# Patient Record
Sex: Female | Born: 1965 | Race: White | Hispanic: No | Marital: Married | Smoking: Never smoker
Health system: Southern US, Community
[De-identification: ages and names within clinical notes are randomized; demographics above are authoritative.]

## PROBLEM LIST (undated history)

## (undated) DIAGNOSIS — F419 Anxiety disorder, unspecified: Secondary | ICD-10-CM

## (undated) DIAGNOSIS — F32A Depression, unspecified: Secondary | ICD-10-CM

## (undated) DIAGNOSIS — I10 Essential (primary) hypertension: Secondary | ICD-10-CM

## (undated) DIAGNOSIS — F329 Major depressive disorder, single episode, unspecified: Secondary | ICD-10-CM

## (undated) HISTORY — DX: Anxiety disorder, unspecified: F41.9

## (undated) HISTORY — DX: Essential (primary) hypertension: I10

---

## 2014-04-05 LAB — HM PAP SMEAR: HM Pap smear: NEGATIVE

## 2014-08-02 ENCOUNTER — Encounter: Payer: Self-pay | Admitting: Family Medicine

## 2014-08-02 ENCOUNTER — Ambulatory Visit (INDEPENDENT_AMBULATORY_CARE_PROVIDER_SITE_OTHER): Payer: 59 | Admitting: Family Medicine

## 2014-08-02 VITALS — BP 133/84 | HR 90 | Ht 63.5 in | Wt 152.0 lb

## 2014-08-02 DIAGNOSIS — F341 Dysthymic disorder: Secondary | ICD-10-CM

## 2014-08-02 DIAGNOSIS — I10 Essential (primary) hypertension: Secondary | ICD-10-CM | POA: Insufficient documentation

## 2014-08-02 DIAGNOSIS — F419 Anxiety disorder, unspecified: Secondary | ICD-10-CM

## 2014-08-02 DIAGNOSIS — R5381 Other malaise: Secondary | ICD-10-CM

## 2014-08-02 DIAGNOSIS — F32A Depression, unspecified: Secondary | ICD-10-CM

## 2014-08-02 DIAGNOSIS — F329 Major depressive disorder, single episode, unspecified: Secondary | ICD-10-CM

## 2014-08-02 DIAGNOSIS — R5383 Other fatigue: Secondary | ICD-10-CM

## 2014-08-02 DIAGNOSIS — G5702 Lesion of sciatic nerve, left lower limb: Secondary | ICD-10-CM

## 2014-08-02 DIAGNOSIS — G5700 Lesion of sciatic nerve, unspecified lower limb: Secondary | ICD-10-CM

## 2014-08-02 DIAGNOSIS — L659 Nonscarring hair loss, unspecified: Secondary | ICD-10-CM

## 2014-08-02 DIAGNOSIS — Z23 Encounter for immunization: Secondary | ICD-10-CM

## 2014-08-02 MED ORDER — ALPRAZOLAM 0.5 MG PO TABS: 0.5000 mg | ORAL_TABLET | Freq: Every day | ORAL | Status: DC | PRN

## 2014-08-02 NOTE — Progress Notes (Signed)
CC: Rachael [name] is a 48 y.o. female is here for Establish Care   Subjective: HPI:  Pleasant 48 year old here to establish care  Patient reports a history of anxiety and depression stemming back into her 45s. She has been on a variety of antianxiety and antidepression medication. she estimates that for the past 16 years She has been on Cymbalta and as needed Xanax except for during while she's pregnant in the first few months after her pregnancies. Cymbalta has helped with subjective depression not further defined and Xanax has been used no more than 3 times a week for the past few years for panic attacks that only occur during stressful situations at home or around the community. Quality of life is satisfactory on her current regimen per her report. She denies any recent or remote bouts of wanting to harm herself or others nor intolerance to the above medications.  Reports a history of essential hypertension diagnosed while she was on birth control pills. She cannot remember exactly what she was on however it sounds like it contained both estrogen and progesterone and blood pressure became normotensive immediately after stopping the medication  She expresses concerns with fatigue, thinning of her hair, and oily skin that has been noticed on a daily basis for the last month. There's been no change medications or diet nor supplements other than trying biotin which helped the speed of her hair grow but helped with nothing else. She's had her thyroid checked 2 years ago but not recently. She denies any recent surgery, trauma nor particularly stressful events.  She complains of left hip pain that radiates from the left buttock around the lateral hip and down the anterior thigh. It is described as a mild to moderate take after periods of inactivity slightly improved the more she walks or runs and never extends beyond the knee. She's been dealing with it for a year almost on a daily basis now no  interventions as of yet. She's had in the past but never for this long period. no benefit from ibuprofen and there's been no other interventions. Denies recent or remote injury  Review of Systems - General ROS: negative for - chills, fever, night sweats, weight gain or weight loss Ophthalmic ROS: negative for - decreased vision Psychological ROS: negative for - uncontrolled anxiety or depression ENT ROS: negative for - hearing change, nasal congestion, tinnitus or allergies Hematological and Lymphatic ROS: negative for - bleeding problems, bruising or swollen lymph nodes Breast ROS: negative Respiratory ROS: no cough, shortness of breath, or wheezing Cardiovascular ROS: no chest pain or dyspnea on exertion Gastrointestinal ROS: no abdominal pain, change in bowel habits, or black or bloody stools Genito-Urinary ROS: negative for - genital discharge, genital ulcers, incontinence or abnormal bleeding from genitals Musculoskeletal ROS: negative for - joint pain or muscle pain other than that described above Neurological ROS: negative for - headaches or memory loss Dermatological ROS: negative for lumps, mole changes, rash and skin lesion changes  Past Medical History  Diagnosis Date  . Hypertension     Past Surgical History  Procedure Laterality Date  . Cesarean section  01/04/1999   Family History  Problem Relation Age of Onset  . Breast cancer      aunts  . Diabetes      grandmother   . Stroke      grandmother   . Hypertension Father     History   Social History  . Marital Status: Married    Spouse Name:  N/A    Number of Children: N/A  . Years of Education: N/A   Occupational History  . Not on file.   Social History Main Topics  . Smoking status: Never Smoker   . Smokeless tobacco: Not on file  . Alcohol Use: No  . Drug Use: No  . Sexual Activity: Yes    Partners: Male   Other Topics Concern  . Not on file   Social History Narrative  . No narrative on file      Objective: BP 133/84  Pulse 90  Ht 5' 3.5" (1.613 m)  Wt 152 lb (68.947 kg)  BMI 26.50 kg/m2  General: Alert and Oriented, No Acute Distress HEENT: Pupils equal, round, reactive to light. Conjunctivae clear.   moist mucous membranes pharynx unremarkable  Lungs:  clear comfortable work of breathing  Cardiac: Regular rate and rhythm.  Extremities: No peripheral edema.  Strong peripheral pulses. L4 and S1 DTRs two over four bilaterally and symmetric. Full range of motion and strength of both lower extremities. Straight leg raise is negative on the left, no pain with internal or external rotation of the left femur nor is pain reproduced with palpation of the left greater trochanter.  Mental Status: No depression, anxiety, nor agitation. Skin: Warm and dry.  Assessment & Plan: [name] was seen today for establish care.  Diagnoses and associated orders for this visit:  Anxiety and depression - ALPRAZolam (XANAX) 0.5 MG tablet; Take 1 tablet (0.5 mg total) by mouth daily as needed for anxiety.  Thinning hair  Essential hypertension, benign  Other fatigue - Vit D  25 hydroxy (rtn osteoporosis monitoring) - TSH  Piriformis syndrome of left side  Need for prophylactic vaccination and inoculation against influenza    Anxiety and depression: Control, Continue sparing use of Xanax and daily Cymbalta Thinning hair and fatigue: Discussed this could be a sign of premenopausal but will rule out thyroid abnormality or vitamin D deficiency first Piriformis syndrome: She was given a handout at home rehabilitation exercises to do on a daily basis the next 3-4 weeks to help with her left hip pain Essential hypertension: Controlled on diet and exercise interventions  Return if symptoms worsen or fail to improve.

## 2014-08-03 ENCOUNTER — Telehealth: Payer: Self-pay | Admitting: Family Medicine

## 2014-08-03 DIAGNOSIS — E559 Vitamin D deficiency, unspecified: Secondary | ICD-10-CM | POA: Insufficient documentation

## 2014-08-03 LAB — VITAMIN D 25 HYDROXY (VIT D DEFICIENCY, FRACTURES): VIT D 25 HYDROXY: 36 ng/mL (ref 30–89)

## 2014-08-03 LAB — TSH: TSH: 1.436 u[IU]/mL (ref 0.350–4.500)

## 2014-08-03 MED ORDER — VITAMIN D (ERGOCALCIFEROL) 1.25 MG (50000 UNIT) PO CAPS: 50000.0000 [IU] | ORAL_CAPSULE | ORAL | Status: DC

## 2014-08-03 NOTE — Telephone Encounter (Signed)
Sue Lush, Will you please let patient know that her thyroid function was normal however vitamin D was at the lower limit of normal and given her fatigue I'd recommend starting a weekly supplement.  We'll want to recheck this in late December or early January.  [facility]

## 2014-08-03 NOTE — Telephone Encounter (Signed)
Pt.notified

## 2014-08-18 ENCOUNTER — Encounter: Payer: Self-pay | Admitting: Family Medicine

## 2014-10-10 ENCOUNTER — Encounter: Payer: Self-pay | Admitting: Emergency Medicine

## 2014-10-10 ENCOUNTER — Emergency Department
Admission: EM | Admit: 2014-10-10 | Discharge: 2014-10-10 | Disposition: A | Discharge status: Home / Self Care | Payer: 59 | Source: Home / Self Care | Attending: Family Medicine | Admitting: Family Medicine

## 2014-10-10 DIAGNOSIS — J209 Acute bronchitis, unspecified: Secondary | ICD-10-CM

## 2014-10-10 HISTORY — DX: Depression, unspecified: F32.A

## 2014-10-10 HISTORY — DX: Major depressive disorder, single episode, unspecified: F32.9

## 2014-10-10 MED ORDER — BENZONATATE 200 MG PO CAPS: 200.0000 mg | ORAL_CAPSULE | Freq: Every day | ORAL | Status: DC

## 2014-10-10 MED ORDER — AZITHROMYCIN 250 MG PO TABS: ORAL_TABLET | ORAL | Status: DC

## 2014-10-10 NOTE — Discharge Instructions (Signed)
Take plain Mucinex (1200 mg guaifenesin) twice daily for cough and congestion.  May add Sudafed if sinus congestion increases.   Increase fluid intake, rest. May use Afrin nasal spray (or generic oxymetazoline) twice daily for about 5 days.  Also recommend using saline nasal spray several times daily and saline nasal irrigation (AYR is a common brand)  Stop all antihistamines for now, and other non-prescription cough/cold preparations. May take Ibuprofen 200mg , 4 tabs every 8 hours with food for neck soreness.   Follow-up with family doctor if not improving 7 to 10 days.

## 2014-10-10 NOTE — ED Provider Notes (Signed)
CSN: [id number]     Arrival date & time 10/10/14  1102 History   First MD Initiated Contact with Patient 10/10/14 1201     Chief Complaint  Patient presents with  . Generalized Body Aches  . Headache  . Cough  . Hoarse      HPI Comments: Two weeks ago patient developed scratchy throat, and several days later developed a cough.  No sinus congestion.  Her cough has been worse for a week.  She has now developed myalgias and sweats, and the cough awakens her at night.  The history is provided by the patient.    Past Medical History  Diagnosis Date  . Hypertension   . Depression    Past Surgical History  Procedure Laterality Date  . Cesarean section  01/04/1999   Family History  Problem Relation Age of Onset  . Breast cancer      aunts  . Diabetes      grandmother   . Stroke      grandmother   . Hypertension Father    History  Substance Use Topics  . Smoking status: Never Smoker   . Smokeless tobacco: Not on file  . Alcohol Use: No   OB History    No data available     Review of Systems + sore throat + hoarse + cough No pleuritic pain No wheezing + nasal congestion + post-nasal drainage No sinus pain/pressure No itchy/red eyes No earache No hemoptysis No SOB No fever, + chills/sweats No nausea No vomiting No abdominal pain No diarrhea No urinary symptoms No skin rash + fatigue + myalgias + headache Used OTC meds without relief  Allergies  Abilify  Home Medications   Prior to Admission medications   Medication Sig Start Date End Date Taking? Authorizing Provider  ALPRAZolam Prudy Feeler(XANAX) 0.5 MG tablet Take 1 tablet (0.5 mg total) by mouth daily as needed for anxiety. 08/02/14   Sean Hommel, DO  azithromycin (ZITHROMAX Z-PAK) 250 MG tablet Take 2 tabs today; then begin one tab once daily for 4 more days. 10/10/14   Lattie HawStephen A Jariyah Hackley, MD  benzonatate (TESSALON) 200 MG capsule Take 1 capsule (200 mg total) by mouth at bedtime. Take as needed for cough  10/10/14   Lattie HawStephen A Filipe Greathouse, MD  DULoxetine (CYMBALTA) 60 MG capsule Take 60 mg by mouth 2 (two) times daily.    Historical Provider, MD  Vitamin D, Ergocalciferol, (DRISDOL) 50000 UNITS CAPS capsule Take 1 capsule (50,000 Units total) by mouth every 7 (seven) days. Recheck Vitamin D in 3 Months 08/03/14   Laren BoomSean Hommel, DO   BP 118/78 mmHg  Pulse 92  Temp(Src) 98 F (36.7 C) (Oral)  Resp 16  Ht 5' 3.5" (1.613 m)  Wt 160 lb (72.576 kg)  BMI 27.89 kg/m2  SpO2 99%  LMP 09/27/2014 Physical Exam Nursing notes and Vital Signs reviewed. Appearance:  Patient appears healthy, stated age, and in no acute distress Eyes:  Pupils are equal, round, and reactive to light and accomodation.  Extraocular movement is intact.  Conjunctivae are not inflamed  Ears:  Canals normal.  Tympanic membranes normal.  Nose:  Mildly congested turbinates.  No sinus tenderness.  Maxillary sinus tenderness is present.  Pharynx:  Normal Neck:  Supple.  Slightly tender shotty anterior/posterior nodes are palpated bilaterally  Lungs:  Clear to auscultation.  Breath sounds are equal.  Heart:  Regular rate and rhythm without murmurs, rubs, or gallops.  Abdomen:  Nontender without masses or hepatosplenomegaly.  Bowel sounds are present.  No CVA or flank tenderness.  Extremities:  No edema.  No calf tenderness Skin:  No rash present.   ED Course  Procedures  none          MDM   1. Acute bronchitis, unspecified organism    Begin Z-pack to cover atypicals.  Prescription written for Benzonatate [facility](Tessalon) to take at bedtime for night-time cough.  Take plain Mucinex (1200 mg guaifenesin) twice daily for cough and congestion.  May add Sudafed if sinus congestion increases.   Increase fluid intake, rest. May use Afrin nasal spray (or generic oxymetazoline) twice daily for about 5 days.  Also recommend using saline nasal spray several times daily and saline nasal irrigation (AYR is a common brand)  Stop all antihistamines  for now, and other non-prescription cough/cold preparations. May take Ibuprofen 200mg , 4 tabs every 8 hours with food for neck soreness.   Follow-up with family doctor if not improving 7 to 10 days.     Lattie HawStephen A Bijou Easler, MD 10/20/14 [phone]

## 2014-10-10 NOTE — ED Notes (Signed)
Reports 10 day history of cough, hoarseness, general aches and headache; now neck ache she attributes to coughing. Took ibuprofen 400mg  at 0730 today.

## 2014-10-13 ENCOUNTER — Telehealth: Payer: Self-pay | Admitting: Emergency Medicine

## 2014-10-13 NOTE — ED Notes (Signed)
Inquired about patient's status; encourage them to call with questions/concerns.  

## 2014-10-25 ENCOUNTER — Other Ambulatory Visit: Payer: Self-pay | Admitting: Family Medicine

## 2014-12-20 ENCOUNTER — Encounter: Payer: Self-pay | Admitting: Family Medicine

## 2014-12-20 ENCOUNTER — Ambulatory Visit (INDEPENDENT_AMBULATORY_CARE_PROVIDER_SITE_OTHER): Payer: 59 | Admitting: Family Medicine

## 2014-12-20 VITALS — BP 121/80 | HR 72 | Wt 158.0 lb

## 2014-12-20 DIAGNOSIS — M7061 Trochanteric bursitis, right hip: Secondary | ICD-10-CM

## 2014-12-20 DIAGNOSIS — F419 Anxiety disorder, unspecified: Secondary | ICD-10-CM

## 2014-12-20 DIAGNOSIS — F418 Other specified anxiety disorders: Secondary | ICD-10-CM

## 2014-12-20 DIAGNOSIS — L659 Nonscarring hair loss, unspecified: Secondary | ICD-10-CM

## 2014-12-20 DIAGNOSIS — F329 Major depressive disorder, single episode, unspecified: Secondary | ICD-10-CM

## 2014-12-20 MED ORDER — DULOXETINE HCL 60 MG PO CPEP: 60.0000 mg | ORAL_CAPSULE | Freq: Two times a day (BID) | ORAL | Status: DC

## 2014-12-20 NOTE — Progress Notes (Signed)
CC: Rachael MayersSheila [name] is a 49 y.o. female is here for f/u medications   Subjective: HPI:  Follow-up thinning hair: Since I saw her last she has not had any worsening of her subjective hair thinning. Occasionally sees small amounts of hair or brushing. Denies focal hair loss on the scalp. She  States is more diffuse and overall mild in severity. No benefit from vitamin supple mentation including prenatal vitamins and biotin. No hair loss elsewhere nor any skin complaints.  Follow-up anxiety impression: She states the winter is always the worst time of year for both anxiety and depression. She's been taking one Xanax a week since I saw her last for anxiety. She'll take this when she feels overwhelmed by responsibilities as a mother. Symptoms are worsened with car pooling, coordinating arrival departure for her 3 children at 3 separate schools. Symptoms are improved on the weekends. She reports depression is currently mild and tolerable not interfering with her quality of life. She is taking Cymbalta 60 mg twice a day. No thoughts when on herself or others. Both of the above condition seemed to improve with exercise.  She's been trying to run more regularly now however has been having pain in the right hip. This localized on the lateral surface of the hip. It's worse for the first few seconds after periods of inactivity and then disappears after constant activity has been started. Prolonged activity will cause the pain to be worse when it returns. No interventions as of yet. Denies radiation of the pain. It is described as sharp.   Review Of Systems Outlined In HPI  Past Medical History  Diagnosis Date  . Hypertension   . Depression     Past Surgical History  Procedure Laterality Date  . Cesarean section  01/04/1999   Family History  Problem Relation Age of Onset  . Breast cancer      aunts  . Diabetes      grandmother   . Stroke      grandmother   . Hypertension Father     History    Social History  . Marital Status: Married    Spouse Name: N/A    Number of Children: N/A  . Years of Education: N/A   Occupational History  . Not on file.   Social History Main Topics  . Smoking status: Never Smoker   . Smokeless tobacco: Not on file  . Alcohol Use: No  . Drug Use: No  . Sexual Activity:    Partners: Male   Other Topics Concern  . Not on file   Social History Narrative     Objective: BP 121/80 mmHg  Pulse 72  Wt 158 lb (71.668 kg)  Vital signs reviewed. General: Alert and Oriented, No Acute Distress HEENT: Pupils equal, round, reactive to light. Conjunctivae clear.  External ears unremarkable.  Moist mucous membranes. Lungs: Clear and comfortable work of breathing, speaking in full sentences without accessory muscle use. Cardiac: Regular rate and rhythm.  Neuro: CN II-XII grossly intact, gait normal. Extremities: No peripheral edema.  Strong peripheral pulses. Pain is reproduced with palpation of the right greater trochanter Mental Status: No depression, anxiety, nor agitation. Logical though process. Skin: Warm and dry. Scalp has no gross abnormality and no appreciable hair loss.  Assessment & Plan: Rachael HatchetSheila was seen today for f/u medications.  Diagnoses and associated orders for this visit:  Thinning hair  Anxiety and depression - DULoxetine (CYMBALTA) 60 MG capsule; Take 1 capsule (60 mg total) by  mouth 2 (two) times daily.  Trochanteric bursitis of right hip    Thinning hair: Reassurance provided that I don't appreciate a significant amount of hair loss, if she still has subjective symptoms discussed Rogaine, she'll think about it. Anxiety and depression: Controlled continue Cymbalta and sparing use of Xanax, will try to focus on physical activity helping with any lingering depression Bursitis of the right hip: She was given a home exercise rehabilitative exercise plan to be done on a daily basis and if not better in 2 weeks come back to  see me.  Return in about 6 months (around 06/20/2015), or if symptoms worsen or fail to improve.

## 2015-01-17 ENCOUNTER — Other Ambulatory Visit: Payer: Self-pay | Admitting: Family Medicine

## 2015-01-20 ENCOUNTER — Other Ambulatory Visit: Payer: Self-pay | Admitting: Family Medicine

## 2015-02-22 ENCOUNTER — Other Ambulatory Visit: Payer: Self-pay | Admitting: Family Medicine

## 2015-04-04 ENCOUNTER — Ambulatory Visit (INDEPENDENT_AMBULATORY_CARE_PROVIDER_SITE_OTHER): Payer: 59 | Admitting: Family Medicine

## 2015-04-04 ENCOUNTER — Encounter: Payer: Self-pay | Admitting: Family Medicine

## 2015-04-04 VITALS — BP 127/72 | HR 110 | Temp 99.2°F | Wt 147.0 lb

## 2015-04-04 DIAGNOSIS — J029 Acute pharyngitis, unspecified: Secondary | ICD-10-CM | POA: Diagnosis not present

## 2015-04-04 LAB — POCT RAPID STREP A (OFFICE): Rapid Strep A Screen: POSITIVE — AB

## 2015-04-04 MED ORDER — PENICILLIN G BENZATHINE 1200000 UNIT/2ML IM SUSP
1.2000 10*6.[IU] | Freq: Once | INTRAMUSCULAR | Status: AC
Start: 2015-04-04 — End: 2015-04-04
  Administered 2015-04-04: 1.2 10*6.[IU] via INTRAMUSCULAR

## 2015-04-04 NOTE — Progress Notes (Signed)
CC: [name] is a 49 y.o. female is here for Sore Throat   Subjective: HPI:  Sore throat that began 2 days ago worse with swallowing deep in the back of the throat. Interventions have included ibuprofen which seems to be beneficial for about 6 hours. Accompanied by subjective fevers and chills with myalgias. No interventions other than above. Symptoms have not gotten better worse since onset and they came on abruptly Sunday night. No known sick contacts. Denies cough, nasal congestion, facial pain, weakness. Review of systems positive for rash on the torso and headache.   Review Of Systems Outlined In HPI  Past Medical History  Diagnosis Date  . Hypertension   . Depression     Past Surgical History  Procedure Laterality Date  . Cesarean section  01/04/1999   Family History  Problem Relation Age of Onset  . Breast cancer      aunts  . Diabetes      grandmother   . Stroke      grandmother   . Hypertension Father     History   Social History  . Marital Status: Married    Spouse Name: N/A  . Number of Children: N/A  . Years of Education: N/A   Occupational History  . Not on file.   Social History Main Topics  . Smoking status: Never Smoker   . Smokeless tobacco: Not on file  . Alcohol Use: No  . Drug Use: No  . Sexual Activity:    Partners: Male   Other Topics Concern  . Not on file   Social History Narrative     Objective: BP 127/72 mmHg  Pulse 110  Temp(Src) 99.2 F (37.3 C) (Oral)  Wt 147 lb (66.679 kg)  General: Alert and Oriented, No Acute Distress HEENT: Pupils equal, round, reactive to light. Conjunctivae clear.  External ears unremarkable, canals clear with intact TMs with appropriate landmarks.  Middle ear appears open without effusion. Pink inferior turbinates.  Moist mucous membranes, uvula is midline both left and right tonsils have moderate exudates..  Neck supple without palpable lymphadenopathy nor abnormal masses. Lungs: Clear to  auscultation bilaterally, no wheezing/ronchi/rales.  Comfortable work of breathing. Good air movement. Extremities: No peripheral edema.  Strong peripheral pulses.  Mental Status: No depression, anxiety, nor agitation. Skin: Warm and dry. Like erythematous rash on the abdomen and back  Assessment & Plan: Rachael [name] was seen today for sore throat.  Diagnoses and all orders for this visit:  Acute pharyngitis, unspecified pharyngitis type Orders: -     POCT rapid strep A -     penicillin g benzathine (BICILLIN LA) 1200000 UNIT/2ML injection 1.2 Million Units; Inject 2 mLs (1.2 Million Units total) into the muscle once.   offered oral amoxicillin versus intermuscular penicillin she preferred the latter of the 2. Discussed contagiousness and changing toothbrush in 2-3 days. Signs and symptoms requring emergent/urgent reevaluation were discussed with the patient.   Return if symptoms worsen or fail to improve.

## 2015-08-31 ENCOUNTER — Other Ambulatory Visit: Payer: Self-pay | Admitting: Family Medicine

## 2015-09-07 ENCOUNTER — Encounter: Payer: Self-pay | Admitting: Family Medicine

## 2015-09-07 ENCOUNTER — Ambulatory Visit (INDEPENDENT_AMBULATORY_CARE_PROVIDER_SITE_OTHER): Payer: 59 | Admitting: Family Medicine

## 2015-09-07 VITALS — BP 123/81 | HR 78 | Wt 145.0 lb

## 2015-09-07 DIAGNOSIS — F418 Other specified anxiety disorders: Secondary | ICD-10-CM

## 2015-09-07 DIAGNOSIS — Z23 Encounter for immunization: Secondary | ICD-10-CM | POA: Diagnosis not present

## 2015-09-07 DIAGNOSIS — Z9289 Personal history of other medical treatment: Secondary | ICD-10-CM | POA: Diagnosis not present

## 2015-09-07 DIAGNOSIS — Z1211 Encounter for screening for malignant neoplasm of colon: Secondary | ICD-10-CM | POA: Diagnosis not present

## 2015-09-07 DIAGNOSIS — Z0000 Encounter for general adult medical examination without abnormal findings: Secondary | ICD-10-CM

## 2015-09-07 DIAGNOSIS — F419 Anxiety disorder, unspecified: Secondary | ICD-10-CM

## 2015-09-07 DIAGNOSIS — F329 Major depressive disorder, single episode, unspecified: Secondary | ICD-10-CM

## 2015-09-07 DIAGNOSIS — Z01419 Encounter for gynecological examination (general) (routine) without abnormal findings: Secondary | ICD-10-CM

## 2015-09-07 MED ORDER — DULOXETINE HCL 30 MG PO CPEP: ORAL_CAPSULE | ORAL | Status: DC

## 2015-09-07 MED ORDER — VILAZODONE HCL 10 & 20 MG PO KIT: 1.0000 | PACK | Freq: Every day | ORAL | Status: DC

## 2015-09-07 NOTE — Progress Notes (Signed)
CC: Rachael [name] is a 49 y.o. female is here for Depression; Anxiety; and Medication Questions   Subjective: HPI:  Follow-up anxiety and depression: Continues to take Cymbalta 60 mg twice a day. She tells me that she does not feel like it's helping with subjective depression or nervousness or restlessness. She is constantly worrying about something or everything. She denies any paranoia or thought someone to harm self or others. Symptoms are significantly interfering with quality of life.    she tells me she needs referrals for a colonoscopy. She also would like a referral to a local GYN specialist. It's been greater than 1 year since her last mammogram.   Review Of Systems Outlined In HPI  Past Medical History  Diagnosis Date  . Hypertension   . Depression     Past Surgical History  Procedure Laterality Date  . Cesarean section  01/04/1999   Family History  Problem Relation Age of Onset  . Breast cancer      aunts  . Diabetes      grandmother   . Stroke      grandmother   . Hypertension Father     Social History   Social History  . Marital Status: Married    Spouse Name: N/A  . Number of Children: N/A  . Years of Education: N/A   Occupational History  . Not on file.   Social History Main Topics  . Smoking status: Never Smoker   . Smokeless tobacco: Not on file  . Alcohol Use: No  . Drug Use: No  . Sexual Activity:    Partners: Male   Other Topics Concern  . Not on file   Social History Narrative     Objective: BP 123/81 mmHg  Pulse 78  Wt 145 lb (65.772 kg)  Vital signs reviewed. General: Alert and Oriented, No Acute Distress HEENT: Pupils equal, round, reactive to light. Conjunctivae clear.  External ears unremarkable.  Moist mucous membranes. Lungs: Clear and comfortable work of breathing, speaking in full sentences without accessory muscle use. Cardiac: Regular rate and rhythm.  Neuro: CN II-XII grossly intact, gait normal. Extremities: No  peripheral edema.  Strong peripheral pulses.  Mental Status: No depression,nor agitation. Mild anxiety Logical though process. Skin: Warm and dry.  Assessment & Plan: Rachael [name] was seen today for depression, anxiety and medication questions.  Diagnoses and all orders for this visit:  Encounter for immunization  Anxiety and depression -     DULoxetine (CYMBALTA) 30 MG capsule; Taper: Two by mouth every morning and one in the evening for four days then one by mouth twice a day for four days then one by mouth daily for four days.  History of mammography, screening -     MS DIGITAL SCREENING BILATERAL; Future  Special screening for malignant neoplasms, colon -     Ambulatory referral to Gastroenterology  Well woman exam -     Ambulatory referral to Gynecology  Other orders -     Flu Vaccine QUAD 36+ mos IM -     Vilazodone HCl (VIIBRYD STARTER PACK) 10 & 20 MG KIT; Take 1 tablet by mouth daily.    anxiety and depression: Controlled chronic condition discussed tapering down Cymbalta and starting Viibryd, return a few days before this starter kit has run out.  Referral to GYN and gastro-per her request. Ordering mammogram to have done before she sees GYN.   Return in about 4 weeks (around 10/05/2015).  25 minutes spent face-to-face during visit  today of which at least 50% was counseling or coordinating care regarding: 1. Encounter for immunization   2. Anxiety and depression   3. History of mammography, screening   4. Special screening for malignant neoplasms, colon   5. Well woman exam

## 2015-09-13 ENCOUNTER — Telehealth: Payer: Self-pay | Admitting: Family Medicine

## 2015-09-13 NOTE — Telephone Encounter (Signed)
Received fax for prior authorization on Viibryd 10 & 20 starter pak sent through cover my meds waiting on authorization. - CF

## 2015-09-13 NOTE — Telephone Encounter (Signed)
Received fax from [facility] they denied coverage of Viibryd due to patient has not had inadequate treatment response after at least a 30 day trial of at least one SSRI or a documented contraindication or potential drug interaction with at least one generic SSRI. - CF

## 2015-09-14 ENCOUNTER — Ambulatory Visit (INDEPENDENT_AMBULATORY_CARE_PROVIDER_SITE_OTHER): Payer: 59

## 2015-09-14 ENCOUNTER — Telehealth: Payer: Self-pay | Admitting: Family Medicine

## 2015-09-14 DIAGNOSIS — Z1231 Encounter for screening mammogram for malignant neoplasm of breast: Secondary | ICD-10-CM

## 2015-09-14 DIAGNOSIS — Z9289 Personal history of other medical treatment: Secondary | ICD-10-CM

## 2015-09-14 NOTE — Telephone Encounter (Signed)
Arline AspCindy, Will you please notify patient about the denial for Viibridy.  Also I'm pretty sure she told me at her first visit that she's tried a variety of SSRI type of anti-depressants in the past. If she can remember any of the generic forms that didn't work then that should meet the criteria the [facility] needs in order to cover Viibryd.  Can you then please resubmit the PA?

## 2015-09-20 ENCOUNTER — Telehealth: Payer: Self-pay

## 2015-09-20 NOTE — Telephone Encounter (Signed)
Pt notified about PA denial.

## 2015-09-21 NOTE — Telephone Encounter (Signed)
Resent prior authorization through cover my meds and got approval good from 08/22/2015 -10/11/208 as long as member remains covered by plan. - CF

## 2015-10-09 ENCOUNTER — Ambulatory Visit (INDEPENDENT_AMBULATORY_CARE_PROVIDER_SITE_OTHER): Payer: 59 | Admitting: Family Medicine

## 2015-10-09 ENCOUNTER — Encounter: Payer: Self-pay | Admitting: Family Medicine

## 2015-10-09 VITALS — BP 119/73 | HR 80 | Wt 144.0 lb

## 2015-10-09 DIAGNOSIS — I10 Essential (primary) hypertension: Secondary | ICD-10-CM | POA: Diagnosis not present

## 2015-10-09 DIAGNOSIS — F329 Major depressive disorder, single episode, unspecified: Secondary | ICD-10-CM

## 2015-10-09 DIAGNOSIS — F419 Anxiety disorder, unspecified: Secondary | ICD-10-CM

## 2015-10-09 DIAGNOSIS — F418 Other specified anxiety disorders: Secondary | ICD-10-CM | POA: Diagnosis not present

## 2015-10-09 NOTE — Progress Notes (Signed)
CC: Rachael MayersSheila [name] is a 49 y.o. female is here for Medication Management   Subjective: HPI:  Follow-up anxiety and depression: ever since switching from Cymbalta she's noticed that viibryd seems to be helping with her anxiety however she is still having periods of sadness every morning. She's only been on 10 days of her new viibryd. Occasional she'll cry in the mornings if she reads or thinks about something sad. Symptoms are slowly improving since starting on viibryd.  Denies any new side effects other than the above. No thoughts or no herself or others. Symptoms occurring daily basis but only mild in severity. She is not using Xanax as frequently as she was in she was taking Cymbalta.  Follow-up essential hypertension: Currently exercising most days of the week. She is trying her best to keep salt out of her diet. Denies chest pain shortness of breath orthopnea or peripheral edema   Review Of Systems Outlined In HPI  Past Medical History  Diagnosis Date  . Hypertension   . Depression     Past Surgical History  Procedure Laterality Date  . Cesarean section  01/04/1999   Family History  Problem Relation Age of Onset  . Breast cancer      aunts  . Diabetes      grandmother   . Stroke      grandmother   . Hypertension Father     Social History   Social History  . Marital Status: Married    Spouse Name: N/A  . Number of Children: N/A  . Years of Education: N/A   Occupational History  . Not on file.   Social History Main Topics  . Smoking status: Never Smoker   . Smokeless tobacco: Not on file  . Alcohol Use: No  . Drug Use: No  . Sexual Activity:    Partners: Male   Other Topics Concern  . Not on file   Social History Narrative     Objective: BP 119/73 mmHg  Pulse 80  Wt 144 lb (65.318 kg)  Vital signs reviewed. General: Alert and Oriented, No Acute Distress HEENT: Pupils equal, round, reactive to light. Conjunctivae clear.  External ears unremarkable.   Moist mucous membranes. Lungs: Clear and comfortable work of breathing, speaking in full sentences without accessory muscle use. Cardiac: Regular rate and rhythm.  Neuro: CN II-XII grossly intact, gait normal. Extremities: No peripheral edema.  Strong peripheral pulses.  Mental Status: No depression, anxiety, nor agitation. Logical though process. Skin: Warm and dry.  Assessment & Plan: Velna HatchetSheila was seen today for medication management.  Diagnoses and all orders for this visit:  Anxiety and depression  Essential hypertension, benign   Anxiety depression: Anxiety is improving, depression is still uncontrolled. Continue on 20 mg of viibryd, follow-up in 2 weeks if still room for improvement, otherwise if she is feeling better from a depression standpoint she only needs to call for refills of the 20 mg formulation. Essential hypertension: Controlled with diet and exercise.  Return in about 3 months (around 01/09/2016).

## 2015-10-19 ENCOUNTER — Other Ambulatory Visit: Payer: Self-pay | Admitting: Family Medicine

## 2015-10-30 ENCOUNTER — Other Ambulatory Visit: Payer: Self-pay | Admitting: Family Medicine

## 2015-10-30 ENCOUNTER — Telehealth: Payer: Self-pay

## 2015-10-30 MED ORDER — VILAZODONE HCL 20 MG PO TABS: ORAL_TABLET | ORAL | Status: DC

## 2015-10-30 NOTE — Telephone Encounter (Signed)
Pt.notified

## 2015-10-30 NOTE — Telephone Encounter (Signed)
I'm ok with her getting a refill, I'll send another month of the 20mg  formulation to [facility].  Keep appt on the 23rd

## 2015-10-30 NOTE — Telephone Encounter (Signed)
Medication refill request of vibryd was denied.  She has an appointment on 12/23, can this be refilled or should she wait til the day of her appointment?

## 2015-11-03 ENCOUNTER — Ambulatory Visit (INDEPENDENT_AMBULATORY_CARE_PROVIDER_SITE_OTHER): Payer: 59 | Admitting: Family Medicine

## 2015-11-03 ENCOUNTER — Encounter: Payer: Self-pay | Admitting: Family Medicine

## 2015-11-03 VITALS — BP 116/75 | HR 70 | Wt 146.0 lb

## 2015-11-03 DIAGNOSIS — I10 Essential (primary) hypertension: Secondary | ICD-10-CM | POA: Diagnosis not present

## 2015-11-03 DIAGNOSIS — F418 Other specified anxiety disorders: Secondary | ICD-10-CM | POA: Diagnosis not present

## 2015-11-03 DIAGNOSIS — F329 Major depressive disorder, single episode, unspecified: Secondary | ICD-10-CM

## 2015-11-03 DIAGNOSIS — F32A Depression, unspecified: Secondary | ICD-10-CM

## 2015-11-03 DIAGNOSIS — F419 Anxiety disorder, unspecified: Secondary | ICD-10-CM

## 2015-11-03 MED ORDER — VILAZODONE HCL 20 MG PO TABS: ORAL_TABLET | ORAL | Status: DC

## 2015-11-03 NOTE — Progress Notes (Signed)
CC: Rachael MayersSheila [name] is a 49 y.o. female is here for Medication Management   Subjective: HPI:   follow-up essential hypertension: she is trying to keep her sodium intake to a minimum. No outside blood pressures to report. No chest pain shortness of breath orthopnea nor peripheral edema    follow-up anxiety and depression: she feels that viibryd  Is not fully effective.  Everyday she feels a sensation  Of being overwhelmed and  Has lost interest in pleasurable activities. Symptoms are only mild to moderate in severity and  Seem to fluctuate with her menstrual cycle.   Other than that nothing seems to make symptoms better or worse.  She actually thinks that Cymbalta was probably more beneficial than what she is taking now.  No thoughts of wanting to harm self or others. No chage in sleep habits.   Review Of Systems Outlined In HPI  Past Medical History  Diagnosis Date  . Hypertension   . Depression     Past Surgical History  Procedure Laterality Date  . Cesarean section  01/04/1999   Family History  Problem Relation Age of Onset  . Breast cancer      aunts  . Diabetes      grandmother   . Stroke      grandmother   . Hypertension Father     Social History   Social History  . Marital Status: Married    Spouse Name: N/A  . Number of Children: N/A  . Years of Education: N/A   Occupational History  . Not on file.   Social History Main Topics  . Smoking status: Never Smoker   . Smokeless tobacco: Not on file  . Alcohol Use: No  . Drug Use: No  . Sexual Activity:    Partners: Male   Other Topics Concern  . Not on file   Social History Narrative     Objective: BP 116/75 mmHg  Pulse 70  Wt 146 lb (66.225 kg)  Vital signs reviewed. General: Alert and Oriented, No Acute Distress HEENT: Pupils equal, round, reactive to light. Conjunctivae clear.  External ears unremarkable.  Moist mucous membranes. Lungs: Clear and comfortable work of breathing, speaking in full  sentences without accessory muscle use. Cardiac: Regular rate and rhythm.  Neuro: CN II-XII grossly intact, gait normal. Extremities: No peripheral edema.  Strong peripheral pulses.  Mental Status: No depression, anxiety, nor agitation. Logical though process. Skin: Warm and dry. Assessment & Plan: Rachael HatchetSheila was seen today for medication management.  Diagnoses and all orders for this visit:  Essential hypertension, benign  Anxiety and depression  Other orders -     Vilazodone HCl (VIIBRYD) 20 MG TABS; Two by mouth daily.   Uncontrolled anxiety and depression Increasing viibryd to maximum dose. If asked her to call me after the first of the year to let me know if this is fully effective for her or there is still room for improvement. She's tried Effexor in the past however I would propose that we go back to this if she is still symptomatic after a week and a half.  Essential hypertension: Controlled continue diet and exercise interventions. Return for Call after new year's with symptom update..Marland Kitchen

## 2015-11-15 ENCOUNTER — Telehealth: Payer: Self-pay

## 2015-11-15 MED ORDER — VENLAFAXINE HCL ER 75 MG PO CP24: ORAL_CAPSULE | ORAL | Status: DC

## 2015-11-15 NOTE — Telephone Encounter (Signed)
Pt reports that she feels vibryd 40 mg daily is helping with her depression but not so much with anxiety.  It makes her heart race.  Is this normal?  Do you have any suggestions?

## 2015-11-15 NOTE — Telephone Encounter (Signed)
Taper off viibryd as follows: 20mg  daily for three days then 10mg  (half tab of the 20mg ) daily for three days  Then begin the generic effexor xr I sent to her pharmacy. F/U appt needed in 3-4 weeks.

## 2015-11-16 NOTE — Telephone Encounter (Signed)
Pt advised.

## 2015-11-17 ENCOUNTER — Telehealth: Payer: Self-pay

## 2015-11-17 NOTE — Telephone Encounter (Signed)
Pt is ordered to taper off of viibryd and she just realized that she only have 1 left. Can more be sent to the pharmacy?

## 2015-11-20 NOTE — Telephone Encounter (Signed)
vm left for pt to return call

## 2015-11-20 NOTE — Telephone Encounter (Signed)
Will you please let patient know that I just found this request today, does she still need additional Viibryd?

## 2015-11-25 ENCOUNTER — Other Ambulatory Visit: Payer: Self-pay | Admitting: Family Medicine

## 2015-11-29 NOTE — Telephone Encounter (Signed)
Rachael [name], Can you please attempt to contact patient given no response to the last phone note.

## 2015-12-14 ENCOUNTER — Other Ambulatory Visit: Payer: Self-pay | Admitting: Family Medicine

## 2015-12-25 ENCOUNTER — Other Ambulatory Visit: Payer: Self-pay | Admitting: Family Medicine

## 2016-01-11 ENCOUNTER — Other Ambulatory Visit: Payer: Self-pay | Admitting: Family Medicine

## 2016-01-11 NOTE — Telephone Encounter (Signed)
[facility] [address]

## 2016-02-20 ENCOUNTER — Telehealth: Payer: Self-pay

## 2016-02-20 MED ORDER — VENLAFAXINE HCL ER 75 MG PO CP24: 150.0000 mg | ORAL_CAPSULE | Freq: Every day | ORAL | Status: DC

## 2016-02-20 NOTE — Telephone Encounter (Signed)
Ok, change has been made

## 2016-02-20 NOTE — Telephone Encounter (Signed)
[facility] be cheaper if she was Rx'd a 90 day supply of Effexor.

## 2016-03-18 ENCOUNTER — Other Ambulatory Visit: Payer: Self-pay | Admitting: Family Medicine

## 2016-06-04 ENCOUNTER — Other Ambulatory Visit: Payer: Self-pay | Admitting: Family Medicine

## 2016-06-12 ENCOUNTER — Ambulatory Visit (INDEPENDENT_AMBULATORY_CARE_PROVIDER_SITE_OTHER): Payer: 59 | Admitting: Family Medicine

## 2016-06-12 ENCOUNTER — Encounter: Payer: Self-pay | Admitting: Family Medicine

## 2016-06-12 VITALS — BP 123/81 | HR 82 | Wt 148.0 lb

## 2016-06-12 DIAGNOSIS — F419 Anxiety disorder, unspecified: Secondary | ICD-10-CM

## 2016-06-12 DIAGNOSIS — F329 Major depressive disorder, single episode, unspecified: Secondary | ICD-10-CM

## 2016-06-12 DIAGNOSIS — F418 Other specified anxiety disorders: Secondary | ICD-10-CM

## 2016-06-12 MED ORDER — ALPRAZOLAM 0.5 MG PO TABS: 0.5000 mg | ORAL_TABLET | Freq: Every day | ORAL | 3 refills | Status: DC | PRN

## 2016-06-12 NOTE — Progress Notes (Signed)
CC: Rachael [name] is a 50 y.o. female is here for Anxiety; Depression; and Medication Refill   Subjective: HPI:  Follow-up anxiety and depression: She states that her depression is the best controlled out of all of the different anti- depression medication she is ever tried. She denies any known side effects. She denies any change in hobbies, energy level, or thoughts of wanting to harm self or others. She is overall pleased with Effexor.  She is taking Xanax no more than once a day to help with anxiety and stress. She tells me that her anxiety is very unpredictable and there will be times all she'll go a week without having to take Xanax. It sounds like on average she is using this and 3 days spurts with 1-2 days between the spurts where she doesn't take xanax.     Review Of Systems Outlined In HPI  Past Medical History:  Diagnosis Date  . Depression   . Hypertension     Past Surgical History:  Procedure Laterality Date  . CESAREAN SECTION  01/04/1999   Family History  Problem Relation Age of Onset  . Breast cancer      aunts  . Diabetes      grandmother   . Stroke      grandmother   . Hypertension Father     Social History   Social History  . Marital status: Married    Spouse name: N/A  . Number of children: N/A  . Years of education: N/A   Occupational History  . Not on file.   Social History Main Topics  . Smoking status: Never Smoker  . Smokeless tobacco: Not on file  . Alcohol use No  . Drug use: No  . Sexual activity: Yes    Partners: Male   Other Topics Concern  . Not on file   Social History Narrative  . No narrative on file     Objective: BP 123/81   Pulse 82   Wt 148 lb (67.1 kg)   BMI 25.81 kg/m   Vital signs reviewed. General: Alert and Oriented, No Acute Distress HEENT: Pupils equal, round, reactive to light. Conjunctivae clear.  External ears unremarkable.  Moist mucous membranes. Lungs: Clear and comfortable work of breathing,  speaking in full sentences without accessory muscle use. Cardiac: Regular rate and rhythm.  Neuro: CN II-XII grossly intact, gait normal. Extremities: No peripheral edema.  Strong peripheral pulses.  Mental Status: No depression, anxiety, nor agitation. Logical though process. Skin: Warm and dry.  Assessment & Plan: [name] was seen today for anxiety, depression and medication refill.  Diagnoses and all orders for this visit:  Anxiety and depression  Other orders -     ALPRAZolam (XANAX) 0.5 MG tablet; Take 1 tablet (0.5 mg total) by mouth daily as needed for anxiety.   Anxiety depression: Currently controlled with Xanax and Effexor respectively. I propose going up on Effexor to see if she can get better control of her anxiety however she politely declines  Discussed with this patient that I will be resigning from my position here with [facility] in September in order to stay with my family who will be moving to [place] [facility]. I let him know about the providers that are still accepting patients and I feel that this individual will be under great care if he/she stays here with [facility].   Return in about 6 months (around 12/13/2016) for Anxiety and Depression.

## 2016-09-04 ENCOUNTER — Ambulatory Visit (INDEPENDENT_AMBULATORY_CARE_PROVIDER_SITE_OTHER): Payer: 59 | Admitting: Obstetrics & Gynecology

## 2016-09-04 ENCOUNTER — Encounter: Payer: Self-pay | Admitting: Obstetrics & Gynecology

## 2016-09-04 VITALS — BP 125/72 | HR 71 | Resp 16 | Ht 63.5 in | Wt 151.0 lb

## 2016-09-04 DIAGNOSIS — Z23 Encounter for immunization: Secondary | ICD-10-CM | POA: Diagnosis not present

## 2016-09-04 DIAGNOSIS — Z1151 Encounter for screening for human papillomavirus (HPV): Secondary | ICD-10-CM | POA: Diagnosis not present

## 2016-09-04 DIAGNOSIS — Z01419 Encounter for gynecological examination (general) (routine) without abnormal findings: Secondary | ICD-10-CM

## 2016-09-04 DIAGNOSIS — Z124 Encounter for screening for malignant neoplasm of cervix: Secondary | ICD-10-CM | POA: Diagnosis not present

## 2016-09-04 NOTE — Progress Notes (Signed)
Subjective:    Rachael MayersSheila [name] is a 50 y.o. MW P3 (17,15, and 728 yo kids) female who presents for an annual exam. The patient has no complaints today. She is feeling that she is irratable, easily overwhelmed, and can't focus, followed by Dr. Ivan AnchorsHommel. The patient is sexually active. GYN screening history: last pap: was normal. The patient wears seatbelts: yes. The patient participates in regular exercise: yes. Has the patient ever been transfused or tattooed?: no. The patient reports that there is not domestic violence in her life.   Menstrual History: OB History    Gravida Para Term Preterm AB Living   4 3   1 1 3    SAB TAB Ectopic Multiple Live Births   1              Menarche age: [age] Patient's last menstrual period was 08/28/2016.    The following portions of the patient's history were reviewed and updated as appropriate: allergies, current medications, past family history, past medical history, past social history, past surgical history and problem list.  Review of Systems Pertinent items are noted in HPI.   Married for 23 years, Homemaker Wants flu vaccine Mammogram about 2 months ago FH- breast cancer in m aunts x 2, no gyn/colon cancer Using withdrawal for contraception    Objective:    BP 125/72   Pulse 71   Resp 16   Ht 5' 3.5" (1.613 m)   Wt 151 lb (68.5 kg)   LMP 08/28/2016   BMI 26.33 kg/m   General Appearance:    Alert, cooperative, no distress, appears stated age  Head:    Normocephalic, without obvious abnormality, atraumatic  Eyes:    PERRL, conjunctiva/corneas clear, EOM's intact, fundi    benign, both eyes  Ears:    Normal TM's and external ear canals, both ears  Nose:   Nares normal, septum midline, mucosa normal, no drainage    or sinus tenderness  Throat:   Lips, mucosa, and tongue normal; teeth and gums normal  Neck:   Supple, symmetrical, trachea midline, no adenopathy;    thyroid:  no enlargement/tenderness/nodules; no carotid   bruit or JVD   Back:     Symmetric, no curvature, ROM normal, no CVA tenderness  Lungs:     Clear to auscultation bilaterally, respirations unlabored  Chest Wall:    No tenderness or deformity   Heart:    Regular rate and rhythm, S1 and S2 normal, no murmur, rub   or gallop  Breast Exam:    No tenderness, masses, or nipple abnormality  Abdomen:     Soft, non-tender, bowel sounds active all four quadrants,    no masses, no organomegaly  Genitalia:    Normal female without lesion, discharge or tenderness     Extremities:   Extremities normal, atraumatic, no cyanosis or edema  Pulses:   2+ and symmetric all extremities  Skin:   Skin color, texture, turgor normal, no rashes or lesions  Lymph nodes:   Cervical, supraclavicular, and axillary nodes normal  Neurologic:   CNII-XII intact, normal strength, sensation and reflexes    throughout   .    Assessment:    Healthy female exam.    Plan:     Thin prep Pap smear. with cotesting Fasting [facility] at [facility], [facility] Offered other contraceptive choices

## 2016-09-05 LAB — CYTOLOGY - PAP
Diagnosis: NEGATIVE
HPV (WINDOPATH): NOT DETECTED

## 2016-09-09 ENCOUNTER — Other Ambulatory Visit (INDEPENDENT_AMBULATORY_CARE_PROVIDER_SITE_OTHER): Payer: 59

## 2016-09-09 DIAGNOSIS — Z01419 Encounter for gynecological examination (general) (routine) without abnormal findings: Secondary | ICD-10-CM

## 2016-09-10 ENCOUNTER — Telehealth: Payer: Self-pay | Admitting: *Deleted

## 2016-09-10 DIAGNOSIS — R7989 Other specified abnormal findings of blood chemistry: Secondary | ICD-10-CM

## 2016-09-10 DIAGNOSIS — N951 Menopausal and female climacteric states: Secondary | ICD-10-CM

## 2016-09-10 LAB — COMPREHENSIVE METABOLIC PANEL
ALBUMIN: 4.1 g/dL (ref 3.6–5.1)
ALT: 11 U/L (ref 6–29)
AST: 16 U/L (ref 10–35)
Alkaline Phosphatase: 50 U/L (ref 33–115)
BILIRUBIN TOTAL: 0.4 mg/dL (ref 0.2–1.2)
BUN: 8 mg/dL (ref 7–25)
CALCIUM: 8.9 mg/dL (ref 8.6–10.2)
CHLORIDE: 102 mmol/L (ref 98–110)
CO2: 26 mmol/L (ref 20–31)
CREATININE: 0.92 mg/dL (ref 0.50–1.10)
Glucose, Bld: 69 mg/dL (ref 65–99)
Potassium: 5.1 mmol/L (ref 3.5–5.3)
SODIUM: 137 mmol/L (ref 135–146)
TOTAL PROTEIN: 6.6 g/dL (ref 6.1–8.1)

## 2016-09-10 LAB — VITAMIN D 25 HYDROXY (VIT D DEFICIENCY, FRACTURES): Vit D, 25-Hydroxy: 24 ng/mL — ABNORMAL LOW (ref 30–100)

## 2016-09-10 LAB — LIPID PANEL
CHOLESTEROL: 202 mg/dL — AB (ref 125–200)
HDL: 49 mg/dL (ref >=46)
LDL Cholesterol: 137 mg/dL — ABNORMAL HIGH (ref <130)
TRIGLYCERIDES: 79 mg/dL (ref <150)
Total CHOL/HDL Ratio: 4.1 Ratio (ref <=5.0)
VLDL: 16 mg/dL (ref <30)

## 2016-09-10 LAB — CBC
HEMATOCRIT: 37.2 % (ref 35.0–45.0)
HEMOGLOBIN: 12.1 g/dL (ref 11.7–15.5)
MCH: 28.4 pg (ref 27.0–33.0)
MCHC: 32.5 g/dL (ref 32.0–36.0)
MCV: 87.3 fL (ref 80.0–100.0)
MPV: 10.7 fL (ref 7.5–12.5)
Platelets: 260 10*3/uL (ref 140–400)
RBC: 4.26 MIL/uL (ref 3.80–5.10)
RDW: 14 % (ref 11.0–15.0)
WBC: 3.4 10*3/uL — ABNORMAL LOW (ref 3.8–10.8)

## 2016-09-10 LAB — FOLLICLE STIMULATING HORMONE: FSH: 78 m[IU]/mL

## 2016-09-10 LAB — TSH: TSH: 1.27 m[IU]/L

## 2016-09-10 MED ORDER — NORETHIN ACE-ETH ESTRAD-FE 1-20 MG-MCG(24) PO TABS: 1.0000 | ORAL_TABLET | Freq: Every day | ORAL | 11 refills | Status: DC

## 2016-09-10 MED ORDER — VITAMIN D (ERGOCALCIFEROL) 1.25 MG (50000 UNIT) PO CAPS: 50000.0000 [IU] | ORAL_CAPSULE | ORAL | 0 refills | Status: DC

## 2016-09-10 NOTE — Telephone Encounter (Signed)
Dr Marice Potterove spoke with patient regarding her lab results and RX for Vitamin D 50K and Loestrin FE sent to her pharmacy.

## 2016-09-24 ENCOUNTER — Other Ambulatory Visit: Payer: 59

## 2016-09-24 NOTE — Progress Notes (Signed)
Bp check only today is 110/71  P 68.  Pt returned because she had recently been put back on OCP's and they had made her BP go up in the past.  BP today is WNL

## 2016-11-01 ENCOUNTER — Emergency Department
Admission: EM | Admit: 2016-11-01 | Discharge: 2016-11-01 | Disposition: A | Discharge status: Home / Self Care | Payer: 59 | Source: Home / Self Care | Attending: Family Medicine | Admitting: Family Medicine

## 2016-11-01 ENCOUNTER — Encounter: Payer: Self-pay | Admitting: Emergency Medicine

## 2016-11-01 DIAGNOSIS — B9789 Other viral agents as the cause of diseases classified elsewhere: Secondary | ICD-10-CM

## 2016-11-01 DIAGNOSIS — J069 Acute upper respiratory infection, unspecified: Secondary | ICD-10-CM | POA: Diagnosis not present

## 2016-11-01 MED ORDER — BENZONATATE 200 MG PO CAPS: ORAL_CAPSULE | ORAL | 0 refills | Status: DC

## 2016-11-01 MED ORDER — AZITHROMYCIN 250 MG PO TABS: ORAL_TABLET | ORAL | 0 refills | Status: DC

## 2016-11-01 MED ORDER — TRIAMCINOLONE ACETONIDE 40 MG/ML IJ SUSP
40.0000 mg | Freq: Once | INTRAMUSCULAR | Status: AC
  Administered 2016-11-01: 40 mg via INTRAMUSCULAR

## 2016-11-01 NOTE — ED Triage Notes (Signed)
Pt c/o dry cough, sinus pressure and facial pain x1 week.

## 2016-11-01 NOTE — Discharge Instructions (Signed)
Take plain guaifenesin (1200mg extended release tabs such as Mucinex) twice daily, with plenty of water, for cough and congestion.  May add Pseudoephedrine (30mg, one or two every 4 to 6 hours) for sinus congestion.  Get adequate rest.   °May use Afrin nasal spray (or generic oxymetazoline) twice daily for about 5 days and then discontinue.  Also recommend using saline nasal spray several times daily and saline nasal irrigation (AYR is a common brand).  Use Flonase nasal spray each morning after using Afrin nasal spray and saline nasal irrigation. °Try warm salt water gargles for sore throat.  °Stop all antihistamines for now, and other non-prescription cough/cold preparations. °Begin Azithromycin if not improving about one week or if persistent fever develops   °Follow-up with family doctor if not improving about10 days.  °

## 2016-11-01 NOTE — ED Provider Notes (Signed)
[facility]    CSN: [id number] Arrival date & time: 11/01/16  [id number]     History   Chief Complaint Chief Complaint  Patient presents with  . Facial Pain    HPI Rachael [name] is a 50 y.o. female.   About two weeks ago patient developed a dry cough but did not feel ill.  The cough has become worse during the past 3 to 4 days and more productive.  Yesterday she developed myalgias, mild sore throat, and her ears felt clogged. She has a history of seasonal rhinitis, and states that her colds are often prolonged.  She has a family history of asthma (mother).   The history is provided by the patient.    Past Medical History:  Diagnosis Date  . Anxiety   . Depression   . Hypertension     Patient Active Problem List   Diagnosis Date Noted  . Vitamin D deficiency 08/03/2014  . Anxiety and depression 08/02/2014  . Thinning hair 08/02/2014  . Essential hypertension, benign 08/02/2014    Past Surgical History:  Procedure Laterality Date  . CESAREAN SECTION  01/04/1999    OB History    Gravida Para Term Preterm AB Living   4 3   1 1 3    SAB TAB Ectopic Multiple Live Births   1               Home Medications    Prior to Admission medications   Medication Sig Start Date End Date Taking? Authorizing Provider  ALPRAZolam Prudy Feeler(XANAX) 0.5 MG tablet Take 1 tablet (0.5 mg total) by mouth daily as needed for anxiety. 06/12/16   Sean Hommel, DO  azithromycin (ZITHROMAX Z-PAK) 250 MG tablet Take 2 tabs today; then begin one tab once daily for 4 more days. (Rx void after 11/09/16) 11/01/16   Lattie HawStephen A Beese, MD  benzonatate (TESSALON) 200 MG capsule Take one cap by mouth at bedtime as needed for cough.  May repeat in 4 to 6 hours 11/01/16   Lattie HawStephen A Beese, MD  Biotin (BIOTIN 5000) 5 MG CAPS Take by mouth.    Historical Provider, MD  Norethindrone Acetate-Ethinyl Estrad-FE (LOESTRIN 24 FE) 1-20 MG-MCG(24) tablet Take 1 tablet by mouth daily. 09/10/16   Allie BossierMyra C Dove, MD    Probiotic Product (PROBIOTIC-10 PO) Take by mouth.    Historical Provider, MD  venlafaxine XR (EFFEXOR-XR) 75 MG 24 hr capsule Take 2 capsules (150 mg total) by mouth daily. 02/20/16   Laren BoomSean Hommel, DO  Vitamin D, Ergocalciferol, (DRISDOL) 50000 units CAPS capsule Take 1 capsule (50,000 Units total) by mouth every 7 (seven) days. 09/10/16   Allie BossierMyra C Dove, MD    Family History Family History  Problem Relation Age of Onset  . Breast cancer      aunts  . Diabetes      grandmother   . Stroke      grandmother   . Hypertension Father     Social History Social History  Substance Use Topics  . Smoking status: Never Smoker  . Smokeless tobacco: Never Used  . Alcohol use No     Allergies   Abilify [aripiprazole]   Review of Systems Review of Systems ? sore throat + cough No pleuritic pain No wheezing + nasal congestion + post-nasal drainage + sinus pain/pressure No itchy/red eyes ? earache No hemoptysis No SOB No fever, + chills No nausea No vomiting No abdominal pain No diarrhea No urinary symptoms No skin rash +  fatigue + myalgias + headache Used OTC meds without relief   Physical Exam Triage Vital Signs ED Triage Vitals  Enc Vitals Group     BP 11/01/16 1015 116/80     Pulse Rate 11/01/16 1015 96     Resp --      Temp 11/01/16 1015 98.4 F (36.9 C)     Temp Source 11/01/16 1015 Oral     SpO2 11/01/16 1015 100 %     Weight 11/01/16 1015 150 lb (68 kg)     Height --      Head Circumference --      Peak Flow --      Pain Score 11/01/16 1018 2     Pain Loc --      Pain Edu? --      Excl. in GC? --    No data found.   Updated Vital Signs BP 116/80 (BP Location: Right Arm)   Pulse 96   Temp 98.4 F (36.9 C) (Oral)   Wt 150 lb (68 kg)   SpO2 100%   BMI 26.15 kg/m   Visual Acuity Right Eye Distance:   Left Eye Distance:   Bilateral Distance:    Right Eye Near:   Left Eye Near:    Bilateral Near:     Physical Exam Nursing notes and Vital  Signs reviewed. Appearance:  Patient appears stated age, and in no acute distress Eyes:  Pupils are equal, round, and reactive to light and accomodation.  Extraocular movement is intact.  Conjunctivae are not inflamed  Ears:  Canals normal.  Tympanic membranes normal.  Nose:  Mildly congested turbinates.  No sinus tenderness.    Pharynx:  Normal Neck:  Supple.  Tender enlarged posterior/lateral nodes are palpated bilaterally  Lungs:  Clear to auscultation.  Breath sounds are equal.  Moving air well. Heart:  Regular rate and rhythm without murmurs, rubs, or gallops.  Abdomen:  Nontender without masses or hepatosplenomegaly.  Bowel sounds are present.  No CVA or flank tenderness.  Extremities:  No edema.  Skin:  No rash present.    UC Treatments / Results  Labs (all labs ordered are listed, but only abnormal results are displayed) Labs Reviewed - No data to display  EKG  EKG Interpretation None       Radiology No results found.  Procedures Procedures (including critical care time)  Medications Ordered in UC Medications  triamcinolone acetonide (KENALOG-40) injection 40 mg (not administered)     Initial Impression / Assessment and Plan / UC Course  I have reviewed the triage vital signs and the nursing notes.  Pertinent labs & imaging results that were available during my care of the patient were reviewed by me and considered in my medical decision making (see chart for details).  Clinical Course   With patient's history of seasonal rhinitis, family history of asthma, and past history of viral URI's that linger, she probably has a predisposition to mild reactive airways disease.  Administered Kenalog 40mg  IM  Prescription written for Benzonatate (Tessalon) to take at bedtime for night-time cough.  Take plain guaifenesin (1200mg  extended release tabs such as Mucinex) twice daily, with plenty of water, for cough and congestion.  May add Pseudoephedrine (30mg , one or two every  4 to 6 hours) for sinus congestion.  Get adequate rest.   May use Afrin nasal spray (or generic oxymetazoline) twice daily for about 5 days and then discontinue.  Also recommend using saline nasal spray several times  daily and saline nasal irrigation (AYR is a common brand).  Use Flonase nasal spray each morning after using Afrin nasal spray and saline nasal irrigation. Try warm salt water gargles for sore throat.  Stop all antihistamines for now, and other non-prescription cough/cold preparations. Begin Azithromycin if not improving about one week or if persistent fever develops (Given a prescription to hold, with an expiration date)  Follow-up with family doctor if not improving about10 days.     Final Clinical Impressions(s) / UC Diagnoses   Final diagnoses:  Viral URI with cough    New Prescriptions New Prescriptions   AZITHROMYCIN (ZITHROMAX Z-PAK) 250 MG TABLET    Take 2 tabs today; then begin one tab once daily for 4 more days. (Rx void after 11/09/16)   BENZONATATE (TESSALON) 200 MG CAPSULE    Take one cap by mouth at bedtime as needed for cough.  May repeat in 4 to 6 hours     Lattie HawStephen A Beese, MD 11/17/16 1000

## 2016-11-11 ENCOUNTER — Encounter: Payer: Self-pay | Admitting: *Deleted

## 2016-11-11 ENCOUNTER — Emergency Department (INDEPENDENT_AMBULATORY_CARE_PROVIDER_SITE_OTHER): Payer: 59

## 2016-11-11 ENCOUNTER — Emergency Department
Admission: EM | Admit: 2016-11-11 | Discharge: 2016-11-11 | Disposition: A | Discharge status: Home / Self Care | Payer: 59 | Source: Home / Self Care | Attending: Family Medicine | Admitting: Family Medicine

## 2016-11-11 DIAGNOSIS — M542 Cervicalgia: Secondary | ICD-10-CM

## 2016-11-11 DIAGNOSIS — R059 Cough, unspecified: Secondary | ICD-10-CM

## 2016-11-11 DIAGNOSIS — S161XXA Strain of muscle, fascia and tendon at neck level, initial encounter: Secondary | ICD-10-CM | POA: Diagnosis not present

## 2016-11-11 DIAGNOSIS — R05 Cough: Secondary | ICD-10-CM

## 2016-11-11 IMAGING — DX DG CERVICAL SPINE COMPLETE 4+V
5 series · 5 of 5 positions shown · non-contrast
Comparison: None.

CLINICAL DATA: Coughed so hard last Thurs that she felt a pain
shoot from the back of her neck up into her head. Rt lat neck pain
since, pain when turns head.

EXAM:
CERVICAL SPINE - COMPLETE 4+ VIEW

[c-spine lat]
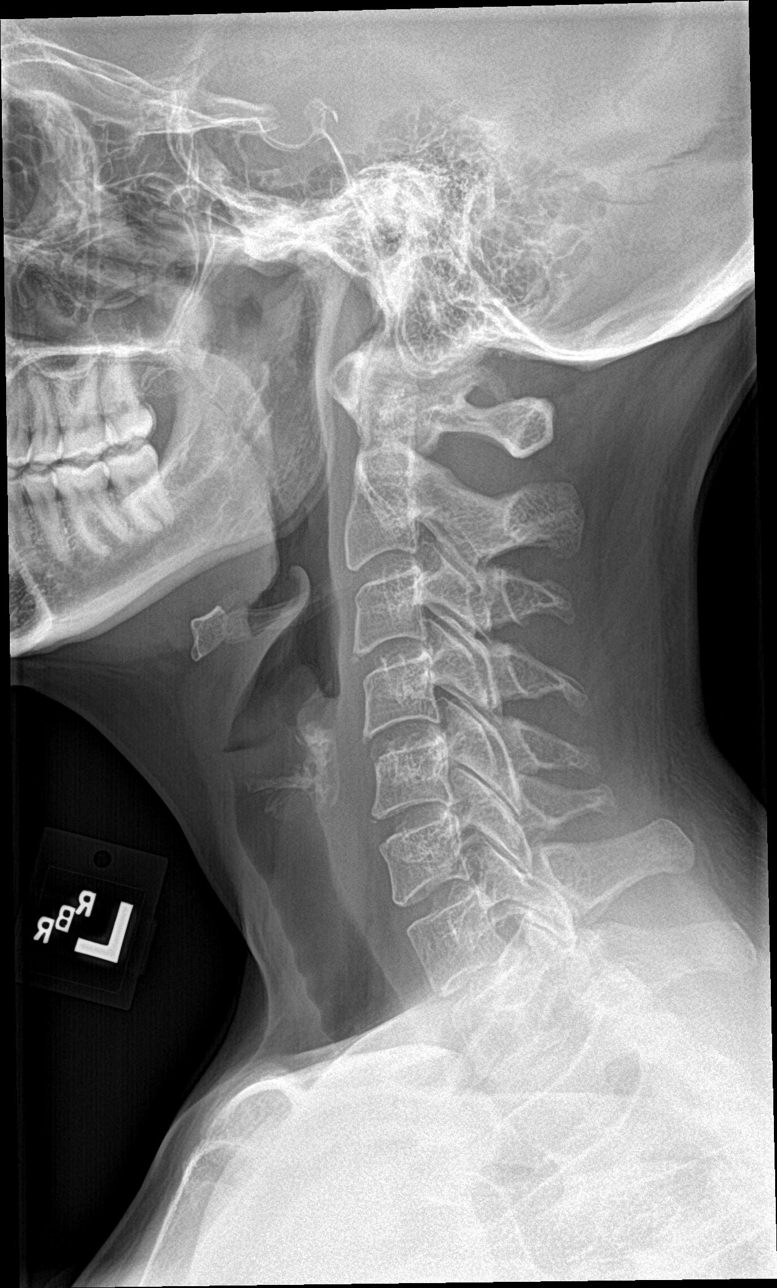

[c-spine obl (1 of 2)]
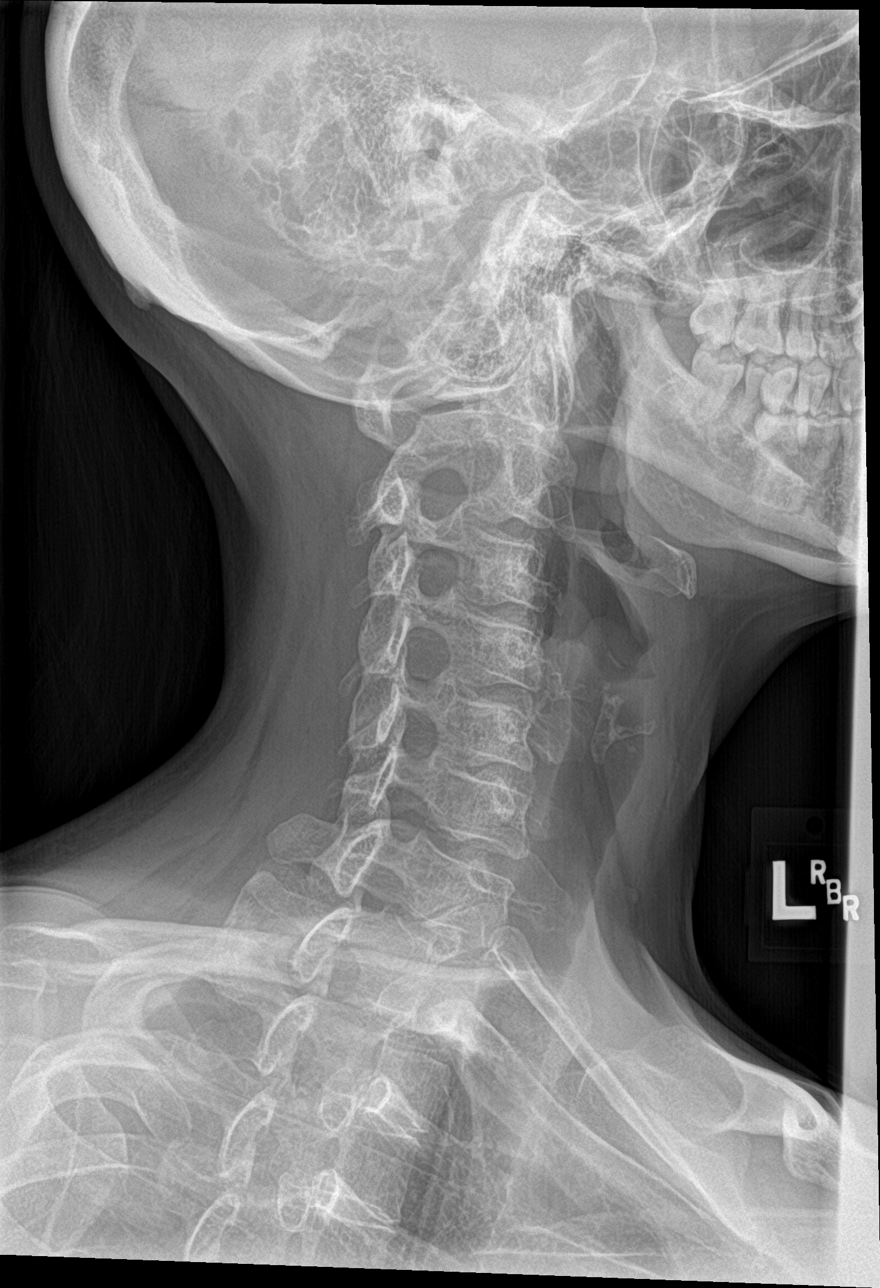

[c-spine obl (2 of 2)]
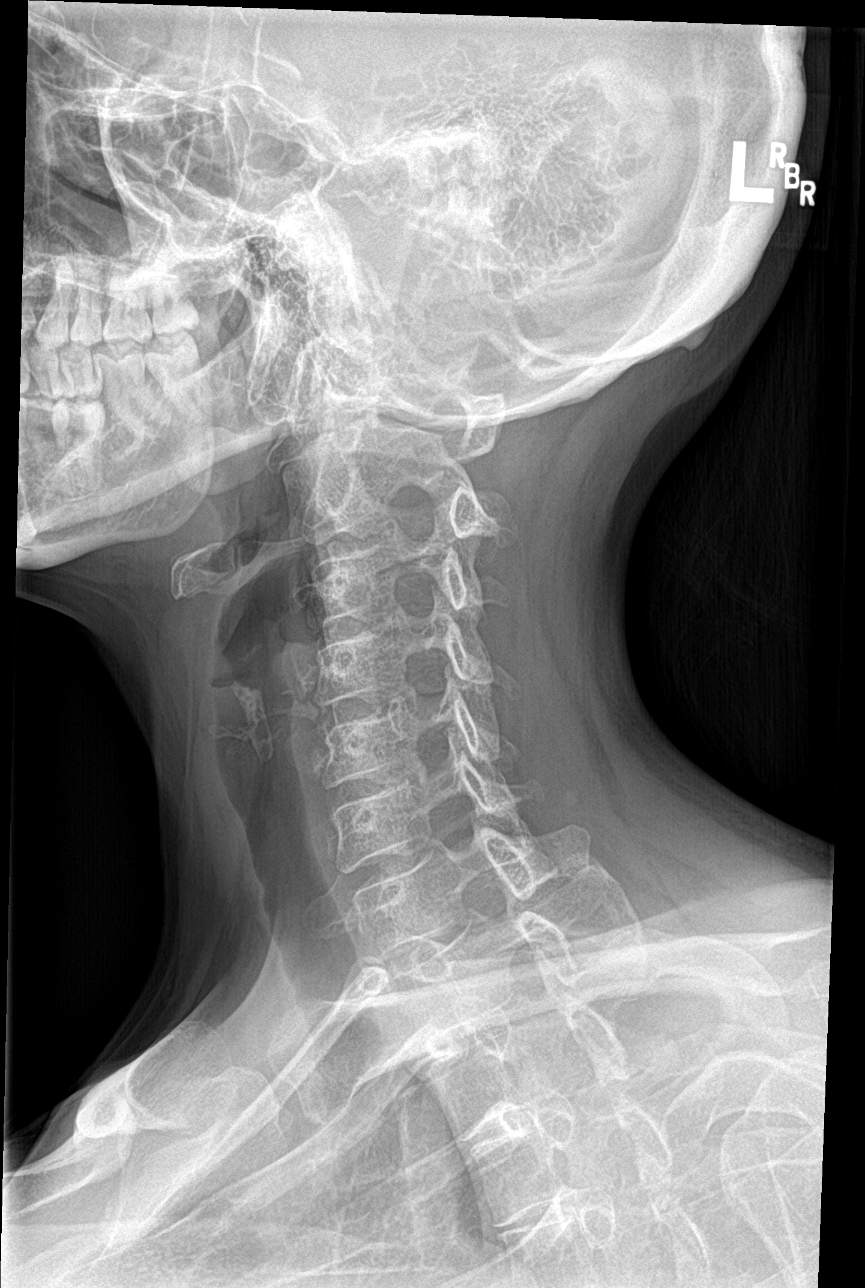

[c-spine ap]
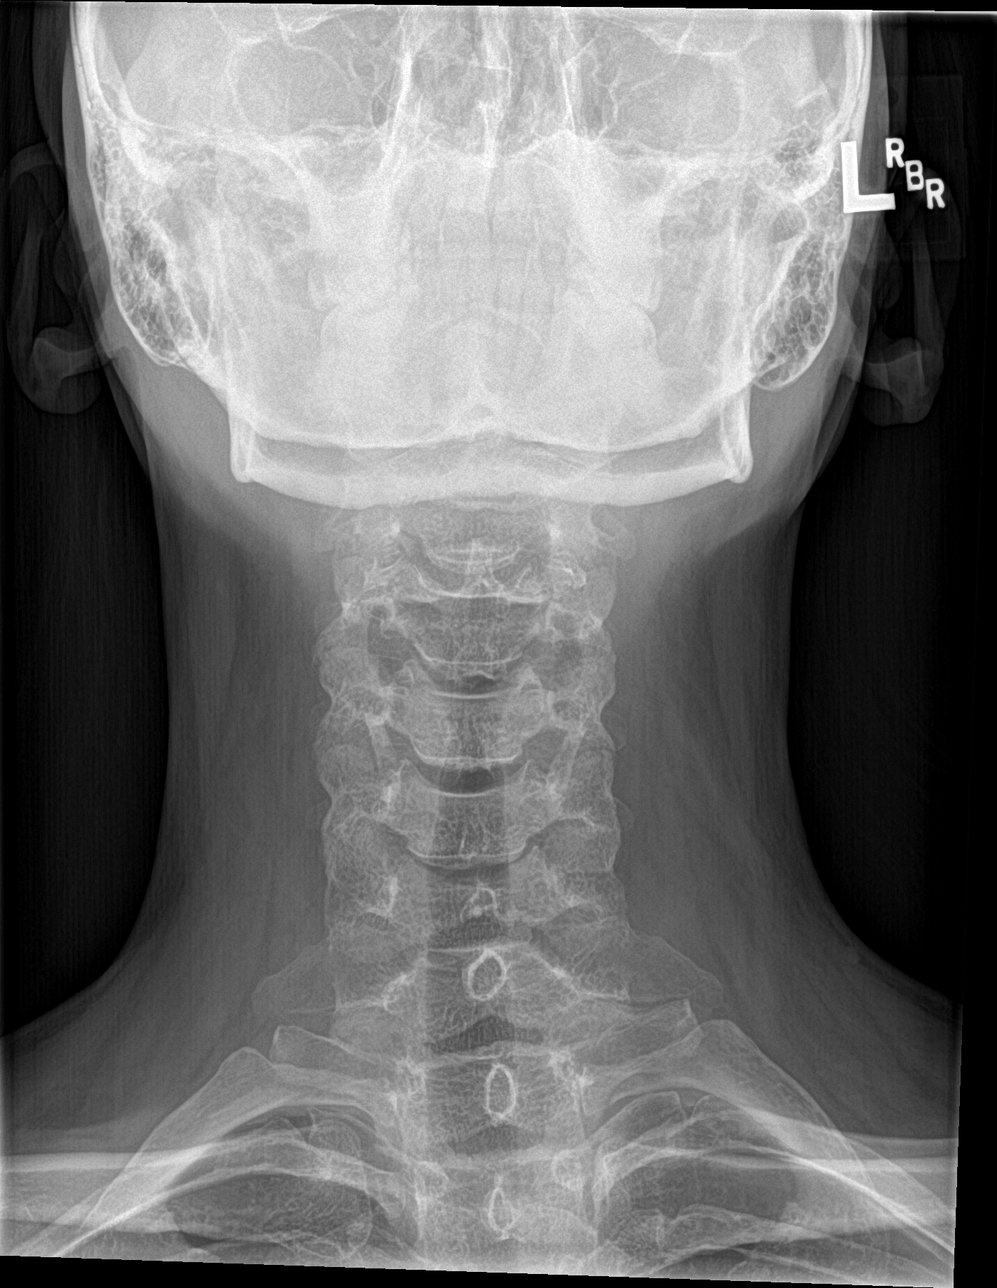

[c-spine open mouth]
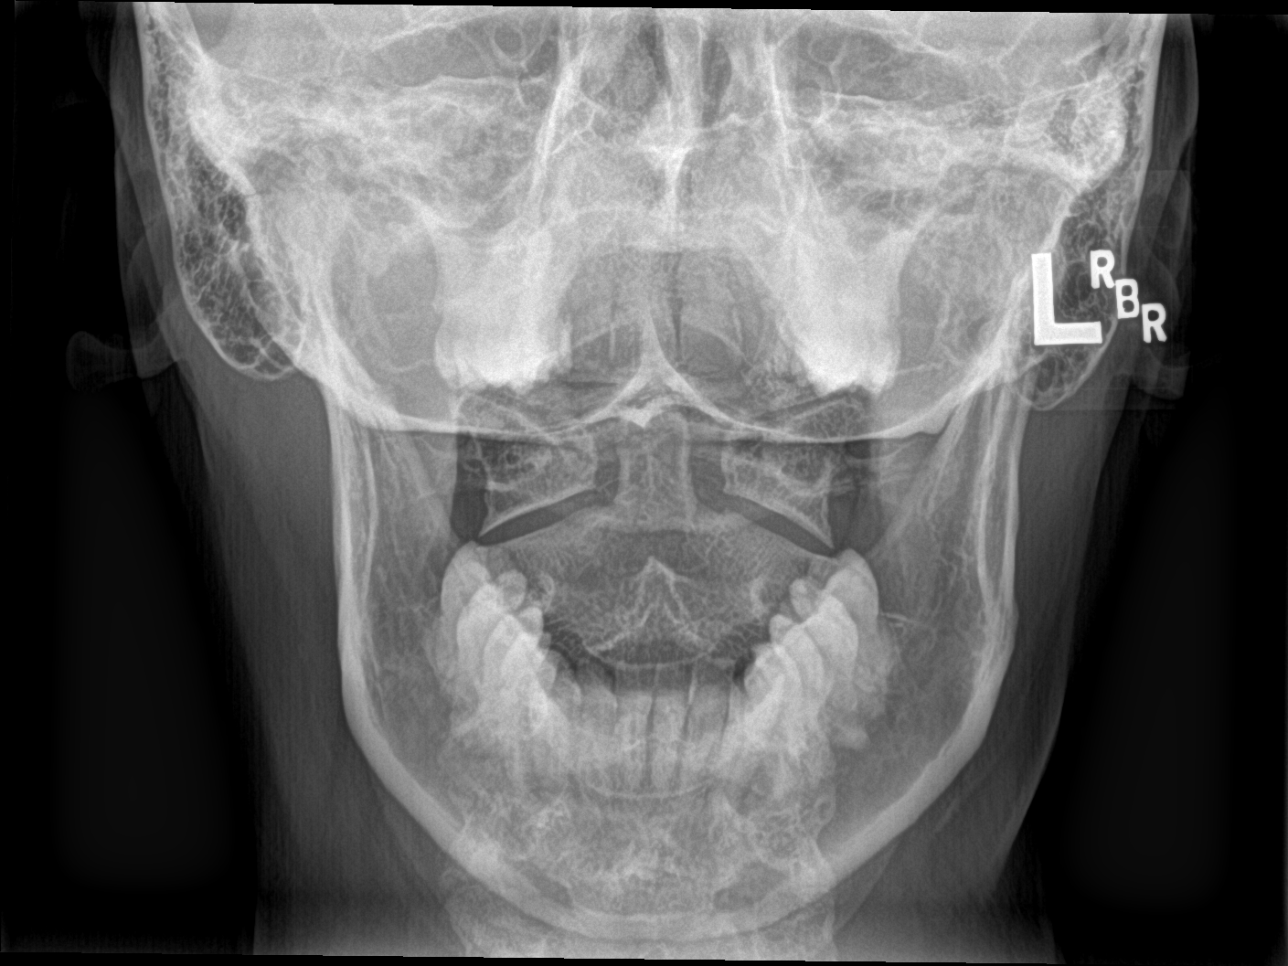

[5 of 5 positions shown; findings below may reference images not displayed]

FINDINGS: There is no evidence of cervical spine fracture or prevertebral soft
tissue swelling. Alignment is normal. No other significant bone
abnormalities are identified.
IMPRESSION: Negative cervical spine radiographs.

## 2016-11-11 MED ORDER — CYCLOBENZAPRINE HCL 10 MG PO TABS: 10.0000 mg | ORAL_TABLET | Freq: Every day | ORAL | 0 refills | Status: DC

## 2016-11-11 MED ORDER — PREDNISONE 20 MG PO TABS: ORAL_TABLET | ORAL | 0 refills | Status: DC

## 2016-11-11 NOTE — Discharge Instructions (Signed)
Apply ice pack for 20 to 30 minutes, 3 to 4 times daily  Continue until pain and swelling decrease.  Begin range of motion and stretching exercises as tolerated. 

## 2016-11-11 NOTE — ED Provider Notes (Signed)
[facility]    CSN: [id number] Arrival date & time: 11/11/16  1052     History   Chief Complaint Chief Complaint  Patient presents with  . Neck Pain    HPI Rachael [name] is a 51 y.o. female.   Patient presents with two complaints: 1) She has had URI symptoms with a cough for about two weeks but has not felt ill.  No fevers, chills, and sweats. Her cough has been improving. 2)  Four days ago after coughing, she felt sudden pain in the back of her neck radiating to her right back and shoulder.  She now has pain when flexing her neck to the left.  Advil 600mg  decreases her pain.   The history is provided by the patient.    Past Medical History:  Diagnosis Date  . Anxiety   . Depression   . Hypertension     Patient Active Problem List   Diagnosis Date Noted  . Perimenopause 11/20/2016  . Chronic fatigue 11/20/2016  . Vitamin D insufficiency 08/03/2014  . Anxiety and depression 08/02/2014  . Thinning hair 08/02/2014  . Essential hypertension, benign 08/02/2014    Past Surgical History:  Procedure Laterality Date  . CESAREAN SECTION  01/04/1999    OB History    Gravida Para Term Preterm AB Living   4 3   1 1 3    SAB TAB Ectopic Multiple Live Births   1               Home Medications    Prior to Admission medications   Medication Sig Start Date End Date Taking? Authorizing Provider  ALPRAZolam Prudy Feeler) 0.5 MG tablet Take 1 tablet (0.5 mg total) by mouth daily as needed for anxiety. 06/12/16   Laren Boom, DO  Biotin (BIOTIN 5000) 5 MG CAPS Take by mouth.    Historical Provider, MD  busPIRone (BUSPAR) 7.5 MG tablet Take 1 tablet (7.5 mg total) by mouth 2 (two) times daily. 11/20/16   Carlis Stable, PA-C  Norethindrone Acetate-Ethinyl Estrad-FE (LOESTRIN 24 FE) 1-20 MG-MCG(24) tablet Take 1 tablet by mouth daily. 09/10/16   Allie Bossier, MD  Probiotic Product (PROBIOTIC-10 PO) Take by mouth.    Historical Provider, MD  venlafaxine XR  (EFFEXOR-XR) 150 MG 24 hr capsule Take 1 capsule (150 mg total) by mouth daily. 11/20/16   Carlis Stable, PA-C  Vitamin D, Ergocalciferol, (DRISDOL) 50000 units CAPS capsule Take 1 capsule (50,000 Units total) by mouth every 7 (seven) days. 09/10/16   Allie Bossier, MD    Family History Family History  Problem Relation Age of Onset  . Breast cancer      aunts  . Diabetes      grandmother   . Stroke      grandmother   . Hypertension Father   . Hyperlipidemia Father   . Asthma Mother   . COPD Mother     Social History Social History  Substance Use Topics  . Smoking status: Never Smoker  . Smokeless tobacco: Never Used  . Alcohol use No     Allergies   Abilify [aripiprazole]   Review of Systems Review of Systems No sore throat + cough No pleuritic pain No wheezing No nasal congestion No post-nasal drainage No sinus pain/pressure No itchy/red eyes No earache No hemoptysis No SOB No fever/chills No nausea No vomiting No abdominal pain No diarrhea No urinary symptoms No skin rash No fatigue No myalgias No headache + neck and  right shoulder pain Used OTC meds without relief   Physical Exam Triage Vital Signs ED Triage Vitals  Enc Vitals Group     BP 11/11/16 1230 136/88     Pulse Rate 11/11/16 1230 88     Resp 11/11/16 1230 16     Temp 11/11/16 1230 98 F (36.7 C)     Temp Source 11/11/16 1230 Oral     SpO2 11/11/16 1230 100 %     Weight 11/11/16 1231 151 lb (68.5 kg)     Height 11/11/16 1231 5' 3.5" (1.613 m)     Head Circumference --      Peak Flow --      Pain Score 11/11/16 1232 7     Pain Loc --      Pain Edu? --      Excl. in GC? --    No data found.   Updated Vital Signs BP 136/88 (BP Location: Left Arm)   Pulse 88   Temp 98 F (36.7 C) (Oral)   Resp 16   Ht 5' 3.5" (1.613 m)   Wt 151 lb (68.5 kg)   SpO2 100%   BMI 26.33 kg/m   Visual Acuity Right Eye Distance:   Left Eye Distance:   Bilateral Distance:     Right Eye Near:   Left Eye Near:    Bilateral Near:     Physical Exam  Constitutional: She appears well-developed and well-nourished. No distress.  HENT:  Head: Normocephalic.  Right Ear: External ear normal.  Left Ear: External ear normal.  Nose: Nose normal.  Mouth/Throat: Oropharynx is clear and moist.  Eyes: Conjunctivae are normal. Pupils are equal, round, and reactive to light.  Neck: Neck supple.    Neck has decreased range of motion.  There is tenderness to palpation over the right trapezius and sternocleidomastoid muscles as noted on diagram.   Cardiovascular: Normal heart sounds.   Pulmonary/Chest: Breath sounds normal.  Abdominal: There is no tenderness.  Musculoskeletal: She exhibits no edema.  Lymphadenopathy:    She has no cervical adenopathy.  Neurological: She is alert.  Skin: Skin is warm and dry.  Nursing note and vitals reviewed.    UC Treatments / Results  Labs (all labs ordered are listed, but only abnormal results are displayed) Labs Reviewed - No data to display  EKG  EKG Interpretation None       Radiology No results found.  Procedures Procedures (including critical care time)  Medications Ordered in UC Medications - No data to display   Initial Impression / Assessment and Plan / UC Course  I have reviewed the triage vital signs and the nursing notes.  Pertinent labs & imaging results that were available during my care of the patient were reviewed by me and considered in my medical decision making (see chart for details).  Clinical Course   Suspect post-infectious cough also. Begin prednisone burst/taper which should improve neck pain and persistent cough. Rx for Flexeril at bedtime. Apply ice pack for 20 to 30 minutes, 3 to 4 times daily  Continue until pain and swelling decrease. Begin range of motion and stretching exercises as tolerated. Followup with Dr. Rodney Langtonhomas Thekkekandam or Dr. Clementeen GrahamEvan Corey ([facility]) if not  improving about two weeks.      Final Clinical Impressions(s) / UC Diagnoses   Final diagnoses:  Neck strain, initial encounter  Cough    New Prescriptions Discharge Medication List as of 11/11/2016  1:43 PM  START taking these medications   Details  cyclobenzaprine (FLEXERIL) 10 MG tablet Take 1 tablet (10 mg total) by mouth at bedtime., Starting Mon 11/11/2016, Print    predniSONE (DELTASONE) 20 MG tablet Take one tab by mouth twice daily for 4 days, then one daily. Take with food., Print         Lattie Haw, MD 11/25/16 [phone]

## 2016-11-11 NOTE — ED Triage Notes (Signed)
Pt c/o neck pain that radiates to her head post coughing x 4 days ago. She took advil 600mg  at 0730 today with some relief.

## 2016-11-15 ENCOUNTER — Other Ambulatory Visit: Payer: Self-pay

## 2016-11-15 MED ORDER — VENLAFAXINE HCL ER 75 MG PO CP24: 150.0000 mg | ORAL_CAPSULE | Freq: Every day | ORAL | 0 refills | Status: DC

## 2016-11-20 ENCOUNTER — Encounter: Payer: Self-pay | Admitting: Physician Assistant

## 2016-11-20 ENCOUNTER — Ambulatory Visit (INDEPENDENT_AMBULATORY_CARE_PROVIDER_SITE_OTHER): Payer: 59 | Admitting: Physician Assistant

## 2016-11-20 ENCOUNTER — Other Ambulatory Visit: Payer: Self-pay

## 2016-11-20 VITALS — BP 137/84 | HR 78 | Wt 152.0 lb

## 2016-11-20 DIAGNOSIS — N951 Menopausal and female climacteric states: Secondary | ICD-10-CM | POA: Insufficient documentation

## 2016-11-20 DIAGNOSIS — F32A Depression, unspecified: Secondary | ICD-10-CM

## 2016-11-20 DIAGNOSIS — D72819 Decreased white blood cell count, unspecified: Secondary | ICD-10-CM | POA: Diagnosis not present

## 2016-11-20 DIAGNOSIS — F419 Anxiety disorder, unspecified: Secondary | ICD-10-CM

## 2016-11-20 DIAGNOSIS — R5382 Chronic fatigue, unspecified: Secondary | ICD-10-CM

## 2016-11-20 DIAGNOSIS — F418 Other specified anxiety disorders: Secondary | ICD-10-CM | POA: Diagnosis not present

## 2016-11-20 DIAGNOSIS — F329 Major depressive disorder, single episode, unspecified: Secondary | ICD-10-CM

## 2016-11-20 DIAGNOSIS — R030 Elevated blood-pressure reading, without diagnosis of hypertension: Secondary | ICD-10-CM

## 2016-11-20 LAB — CBC WITH DIFFERENTIAL/PLATELET
BASOS ABS: 0 {cells}/uL (ref 0–200)
Basophils Relative: 0 %
EOS ABS: 126 {cells}/uL (ref 15–500)
Eosinophils Relative: 2 %
HEMATOCRIT: 37.2 % (ref 35.0–45.0)
HEMOGLOBIN: 12.2 g/dL (ref 11.7–15.5)
LYMPHS ABS: 2457 {cells}/uL (ref 850–3900)
Lymphocytes Relative: 39 %
MCH: 27.7 pg (ref 27.0–33.0)
MCHC: 32.8 g/dL (ref 32.0–36.0)
MCV: 84.4 fL (ref 80.0–100.0)
MONO ABS: 315 {cells}/uL (ref 200–950)
MONOS PCT: 5 %
MPV: 9.9 fL (ref 7.5–12.5)
NEUTROS ABS: 3402 {cells}/uL (ref 1500–7800)
Neutrophils Relative %: 54 %
Platelets: 383 10*3/uL (ref 140–400)
RBC: 4.41 MIL/uL (ref 3.80–5.10)
RDW: 14.2 % (ref 11.0–15.0)
WBC: 6.3 10*3/uL (ref 3.8–10.8)

## 2016-11-20 MED ORDER — BUSPIRONE HCL 7.5 MG PO TABS: 7.5000 mg | ORAL_TABLET | Freq: Two times a day (BID) | ORAL | 1 refills | Status: DC

## 2016-11-20 MED ORDER — VENLAFAXINE HCL ER 150 MG PO CP24: 150.0000 mg | ORAL_CAPSULE | Freq: Every day | ORAL | 1 refills | Status: DC

## 2016-11-20 NOTE — Progress Notes (Signed)
HPI:                                                                Rachael [name] is a 51 y.o. female who presents to [name]: [facility] today to establish care  Anxiety/Depression: patient taking Effexor 75mg . Endorses using her Xanax on average twice a week. She feels when her depression symptoms are flaring, she does not have anxiety. But when she doesn't feel depressed, she has anxiety and some panic attacks. Reports mood is "nervous and unfocused." Appetite is fine. Exercises regularly, and this helps. No insomnia. Patient complains of hypersomnolence. Not currently in counseling or therapy. Denies symptoms of mania and hypomania. Denies suicidal thinking.   She is followed by OB/GYN for Paps and perimenopausal symptoms.  Health Maintenance Health Maintenance  Topic Date Due  . HIV Screening  10/16/1983  . TETANUS/TDAP  10/16/1987  . PAP SMEAR  09/05/2019  . INFLUENZA VACCINE  Completed     Past Medical History:  Diagnosis Date  . Anxiety   . Depression   . Hypertension    Past Surgical History:  Procedure Laterality Date  . CESAREAN SECTION  01/04/1999   Social History  Substance Use Topics  . Smoking status: Never Smoker  . Smokeless tobacco: Never Used  . Alcohol use No   family history includes Asthma in her mother; COPD in her mother; Hyperlipidemia in her father; Hypertension in her father.  Review of Systems  Constitutional: Positive for diaphoresis. Negative for chills, fever and weight loss.  HENT: Negative.   Eyes: Negative.   Respiratory: Negative.   Cardiovascular: Positive for palpitations. Negative for chest pain.  Gastrointestinal: Negative.   Musculoskeletal: Negative.   Neurological: Negative.   Psychiatric/Behavioral: Positive for depression. Negative for hallucinations, substance abuse and suicidal ideas. The patient is nervous/anxious. The patient does not have insomnia.      Medications: Current  Outpatient Prescriptions  Medication Sig Dispense Refill  . ALPRAZolam (XANAX) 0.5 MG tablet Take 1 tablet (0.5 mg total) by mouth daily as needed for anxiety. 30 tablet 3  . Biotin (BIOTIN 5000) 5 MG CAPS Take by mouth.    . Norethindrone Acetate-Ethinyl Estrad-FE (LOESTRIN 24 FE) 1-20 MG-MCG(24) tablet Take 1 tablet by mouth daily. 1 Package 11  . Probiotic Product (PROBIOTIC-10 PO) Take by mouth.    . venlafaxine XR (EFFEXOR-XR) 75 MG 24 hr capsule Take 2 capsules (150 mg total) by mouth daily. 60 capsule 0  . Vitamin D, Ergocalciferol, (DRISDOL) 50000 units CAPS capsule Take 1 capsule (50,000 Units total) by mouth every 7 (seven) days. 8 capsule 0   No current facility-administered medications for this visit.    Allergies  Allergen Reactions  . Abilify [Aripiprazole]     Panic attacks when coupled with cymbalta       Objective:  BP 137/84   Pulse 78   Wt 152 lb (68.9 kg)   BMI 26.50 kg/m  Gen: well-groomed, cooperative, not ill-appearing, no distress Lungs: Normal work of breathing, clear to auscultation bilaterally Heart: normal rate, regular rhythm, s1, s2, no murmurs Extremities: normal gait, no peripheral edema Skin: warm and dry, no rashes or lesions on exposed skin Psych: slightly anxious affect, pleasant mood, normal speech and thought content  GAD 7 : Generalized Anxiety Score 11/20/2016  Nervous, Anxious, on Edge 3  Control/stop worrying 2  Worry too much - different things 2  Trouble relaxing 2  Restless 1  Easily annoyed or irritable 3  Afraid - awful might happen 1  Total GAD 7 Score 14    No flowsheet data found.  Depression screen PHQ 2/9 11/20/2016  Decreased Interest 1  Down, Depressed, Hopeless 1  PHQ - 2 Score 2  Altered sleeping 1  Tired, decreased energy 3  Change in appetite 0  Feeling bad or failure about yourself  3  Trouble concentrating 2  Moving slowly or fidgety/restless 0  Suicidal thoughts 0  PHQ-9 Score 11    Lab Results   Component Value Date   WBC 3.4 (L) 09/09/2016    I personally reviewed patient's labs, which showed a mild leukopenia.  Assessment and Plan: 51 y.o. female with    Anxiety and depression, uncontrolled - GAD7 score of 14 and PHQ9 score of 11. Patient still having weekly panic attacks - confirmed in [place] controlled database patient last filled 30 day supply of Xanax on 09/05/16  - adding Buspar - follow-up in one month - busPIRone (BUSPAR) 7.5 MG tablet; Take 1 tablet (7.5 mg total) by mouth 2 (two) times daily.  Dispense: 60 tablet; Refill: 1 - venlafaxine XR (EFFEXOR-XR) 150 MG 24 hr capsule; Take 1 capsule (150 mg total) by mouth daily.  Dispense: 90 capsule; Refill: 1  Chronic fatigue - likely depression-related. Noted a slight leukopenia on a CBC from 3 months ago. Will repeat with diff to ensure resolution - she does have known Vit D insufficiency and inconsistently takes supplement   Elevated BP without HTN - patients blood pressure 137/84 today. She recently started OCP's for perimenopause symptoms and is concerned it is going to cause high blood pressure - given information on DASH eating plan - follow-up in one month   Patient education and anticipatory guidance given Patient agrees with treatment plan Follow-up in 1 month or sooner as needed  Levonne Hubertharley E. Cummings PA-C

## 2016-11-20 NOTE — Patient Instructions (Signed)
Go downstairs for blood draw  Return in 1 month to check BP and followup on anxiety  DASH Eating Plan DASH stands for "Dietary Approaches to Stop Hypertension." The DASH eating plan is a healthy eating plan that has been shown to reduce high blood pressure (hypertension). Additional health benefits may include reducing the risk of type 2 diabetes mellitus, heart disease, and stroke. The DASH eating plan may also help with weight loss. What do I need to know about the DASH eating plan? For the DASH eating plan, you will follow these general guidelines:  Choose foods with less than 150 milligrams of sodium per serving (as listed on the food label).  Use salt-free seasonings or herbs instead of table salt or sea salt.  Check with your health care provider or pharmacist before using salt substitutes.  Eat lower-sodium products. These are often labeled as "low-sodium" or "no salt added."  Eat fresh foods. Avoid eating a lot of canned foods.  Eat more vegetables, fruits, and low-fat dairy products.  Choose whole grains. Look for the word "whole" as the first word in the ingredient list.  Choose fish and skinless chicken or [place] more often than red meat. Limit fish, poultry, and meat to 6 oz (170 g) each day.  Limit sweets, desserts, sugars, and sugary drinks.  Choose heart-healthy fats.  Eat more home-cooked food and less restaurant, buffet, and fast food.  Limit fried foods.  Do not fry foods. Cook foods using methods such as baking, boiling, grilling, and broiling instead.  When eating at a restaurant, ask that your food be prepared with less salt, or no salt if possible. What foods can I eat? Seek help from a dietitian for individual calorie needs. Grains  Whole grain or whole wheat bread. Brown rice. Whole grain or whole wheat pasta. Quinoa, bulgur, and whole grain cereals. Low-sodium cereals. Corn or whole wheat flour tortillas. Whole grain cornbread. Whole grain crackers.  Low-sodium crackers. Vegetables  Fresh or frozen vegetables (raw, steamed, roasted, or grilled). Low-sodium or reduced-sodium tomato and vegetable juices. Low-sodium or reduced-sodium tomato sauce and paste. Low-sodium or reduced-sodium canned vegetables. Fruits  All fresh, canned (in natural juice), or frozen fruits. Meat and Other Protein Products  Ground beef (85% or leaner), grass-fed beef, or beef trimmed of fat. Skinless chicken or [place]. Ground chicken or [place]. Pork trimmed of fat. All fish and seafood. Eggs. Dried beans, peas, or lentils. Unsalted nuts and seeds. Unsalted canned beans. Dairy  Low-fat dairy products, such as skim or 1% milk, 2% or reduced-fat cheeses, low-fat ricotta or cottage cheese, or plain low-fat yogurt. Low-sodium or reduced-sodium cheeses. Fats and Oils  Tub margarines without trans fats. Light or reduced-fat mayonnaise and salad dressings (reduced sodium). Avocado. Safflower, olive, or canola oils. Natural peanut or almond butter. Other  Unsalted popcorn and pretzels. The items listed above may not be a complete list of recommended foods or beverages. Contact your dietitian for more options.  What foods are not recommended? Grains  White bread. White pasta. White rice. Refined cornbread. Bagels and croissants. Crackers that contain trans fat. Vegetables  Creamed or fried vegetables. Vegetables in a cheese sauce. Regular canned vegetables. Regular canned tomato sauce and paste. Regular tomato and vegetable juices. Fruits  Canned fruit in light or heavy syrup. Fruit juice. Meat and Other Protein Products  Fatty cuts of meat. Ribs, chicken wings, bacon, sausage, bologna, salami, chitterlings, fatback, hot dogs, bratwurst, and packaged luncheon meats. Salted nuts and seeds. Canned beans  with salt. Dairy  Whole or 2% milk, cream, half-and-half, and cream cheese. Whole-fat or sweetened yogurt. Full-fat cheeses or blue cheese. Nondairy creamers and whipped  toppings. Processed cheese, cheese spreads, or cheese curds. Condiments  Onion and garlic salt, seasoned salt, table salt, and sea salt. Canned and packaged gravies. Worcestershire sauce. Tartar sauce. Barbecue sauce. Teriyaki sauce. Soy sauce, including reduced sodium. Steak sauce. Fish sauce. Oyster sauce. Cocktail sauce. Horseradish. Ketchup and mustard. Meat flavorings and tenderizers. Bouillon cubes. Hot sauce. Tabasco sauce. Marinades. Taco seasonings. Relishes. Fats and Oils  Butter, stick margarine, lard, shortening, ghee, and bacon fat. Coconut, palm kernel, or palm oils. Regular salad dressings. Other  Pickles and olives. Salted popcorn and pretzels. The items listed above may not be a complete list of foods and beverages to avoid. Contact your dietitian for more information.  Where can I find more information? National Heart, Lung, and Blood Institute: [employer] This information is not intended to replace advice given to you by your health care provider. Make sure you discuss any questions you have with your health care provider. Document Released: 10/17/2011 Document Revised: 04/04/2016 Document Reviewed: 09/01/2013 Elsevier Interactive Patient Education  2017 [employer].

## 2016-12-03 ENCOUNTER — Other Ambulatory Visit: Payer: Self-pay | Admitting: Obstetrics & Gynecology

## 2016-12-03 DIAGNOSIS — R7989 Other specified abnormal findings of blood chemistry: Secondary | ICD-10-CM

## 2016-12-23 ENCOUNTER — Ambulatory Visit (INDEPENDENT_AMBULATORY_CARE_PROVIDER_SITE_OTHER): Payer: 59 | Admitting: Physician Assistant

## 2016-12-23 VITALS — BP 148/90 | HR 73 | Wt 153.0 lb

## 2016-12-23 DIAGNOSIS — F331 Major depressive disorder, recurrent, moderate: Secondary | ICD-10-CM | POA: Insufficient documentation

## 2016-12-23 DIAGNOSIS — I10 Essential (primary) hypertension: Secondary | ICD-10-CM

## 2016-12-23 DIAGNOSIS — K219 Gastro-esophageal reflux disease without esophagitis: Secondary | ICD-10-CM | POA: Diagnosis not present

## 2016-12-23 DIAGNOSIS — F419 Anxiety disorder, unspecified: Secondary | ICD-10-CM

## 2016-12-23 DIAGNOSIS — F418 Other specified anxiety disorders: Secondary | ICD-10-CM

## 2016-12-23 DIAGNOSIS — F329 Major depressive disorder, single episode, unspecified: Secondary | ICD-10-CM

## 2016-12-23 MED ORDER — DULOXETINE HCL 20 MG PO CPEP: 20.0000 mg | ORAL_CAPSULE | Freq: Two times a day (BID) | ORAL | 0 refills | Status: DC

## 2016-12-23 MED ORDER — RANITIDINE HCL 300 MG PO TABS: 150.0000 mg | ORAL_TABLET | Freq: Two times a day (BID) | ORAL | 3 refills | Status: DC

## 2016-12-23 MED ORDER — VENLAFAXINE HCL ER 37.5 MG PO CP24: ORAL_CAPSULE | ORAL | 0 refills | Status: DC

## 2016-12-23 MED ORDER — ALPRAZOLAM 0.5 MG PO TABS: 0.5000 mg | ORAL_TABLET | ORAL | 1 refills | Status: DC | PRN

## 2016-12-23 NOTE — Patient Instructions (Addendum)
Taper off of Effexor Take 2, 37.5mg  capsules daily for 1 week, then 1 capsule daily for 1 week then stop At the same time start Cymbalta 20mg  twice daily Continue Buspar 1 tab twice a day. You may add an additional tab for breakthrough anxiety instead of Xanax  Stop OCP's. Use back-up contraception. Check blood pressure at home for 2 weeks Return with blood pressure readings  Start Ranitidine 1 tab daily for acid reflux, may increase to twice a day as needed     How to Take Your Blood Pressure Blood pressure is a measurement of how strongly your blood is pressing against the walls of your arteries. Arteries are blood vessels that carry blood from your heart throughout your body. Your health care provider takes your blood pressure at each office visit. You can also take your own blood pressure at home with a blood pressure machine. You may need to take your own blood pressure:  To confirm a diagnosis of high blood pressure (hypertension).  To monitor your blood pressure over time.  To make sure your blood pressure medicine is working. Supplies needed: To take your blood pressure, you will need a blood pressure machine. You can buy a blood pressure machine, or blood pressure monitor, at most drugstores or online. There are several types of home blood pressure monitors. When choosing one, consider the following:  Choose a monitor that has an arm cuff.  Choose a monitor that wraps snugly around your upper arm. You should be able to fit only one finger between your arm and the cuff.  Do not choose a monitor that measures your blood pressure from your wrist or finger. Your health care provider can suggest a reliable monitor that will meet your needs. How to prepare To get the most accurate reading, avoid the following for 30 minutes before you check your blood pressure:  Drinking caffeine.  Drinking alcohol.  Eating.  Smoking.  Exercising. Five minutes before you check your blood  pressure:  Empty your bladder.  Sit quietly without talking in a dining chair, rather than in a soft couch or armchair. How to take your blood pressure To check your blood pressure, follow the instructions in the manual that came with your blood pressure monitor. If you have a digital blood pressure monitor, the instructions may be as follows: 1. Sit up straight. 2. Place your feet on the floor. Do not cross your ankles or legs. 3. Rest your left arm at the level of your heart on a table or desk or on the arm of a chair. 4. Pull up your shirt sleeve. 5. Wrap the blood pressure cuff around the upper part of your left arm, 1 inch (2.5 cm) above your elbow. It is best to wrap the cuff around bare skin. 6. Fit the cuff snugly around your arm. You should be able to place only one finger between the cuff and your arm. 7. Position the cord inside the groove of your elbow. 8. Press the power button. 9. Sit quietly while the cuff inflates and deflates. 10. Read the digital reading on the monitor screen and write it down (record it). 11. Wait 2-3 minutes, then repeat the steps starting at step 1. What does my blood pressure reading mean? A blood pressure reading is recorded as two numbers, such as "120 over 80" (or 120/80). The first ("top") number is called the systolic pressure. It is a measure of the pressure in your arteries as the heart beats. The  second ("bottom") number is called the diastolic pressure. It is a measure of the pressure in your arteries as the heart relaxes between beats. Blood pressure is classified into four stages. The following are the stages for adults who do not have a short-term serious illness or a chronic condition. Systolic pressure and diastolic pressure are measured in a unit called mm Hg. Normal  Systolic pressure: below 120.  Diastolic pressure: below 80. Prehypertension  Systolic pressure: 120-139.  Diastolic pressure: 80-89. Hypertension stage 1  Systolic  pressure: 140-159.  Diastolic pressure: 90-99. Hypertension stage 2  Systolic pressure: 160 or above.  Diastolic pressure: 100 or above. You can have prehypertension or hypertension even if only the systolic or only the diastolic number in your reading is higher than normal. Follow these instructions at home:  Check your blood pressure as often as recommended by your health care provider.  Take your monitor to the next appointment with your health care provider to make sure:  That you are using it correctly.  That it provides accurate readings.  Be sure you understand what your goal blood pressure numbers are.  Tell your health care provider if you are having any side effects from blood pressure medicine. Contact a health care provider if:  Your blood pressure is consistently high. Get help right away if:  Your systolic blood pressure is higher than 180.  Your diastolic blood pressure is higher than 110. This information is not intended to replace advice given to you by your health care provider. Make sure you discuss any questions you have with your health care provider. Document Released: 04/05/2016 Document Revised: 06/18/2016 Document Reviewed: 04/05/2016 Elsevier Interactive Patient Education  2017 [employer].  Food Choices for Gastroesophageal Reflux Disease, Adult When you have gastroesophageal reflux disease (GERD), the foods you eat and your eating habits are very important. Choosing the right foods can help ease the discomfort of GERD. What general guidelines do I need to follow?  Choose fruits, vegetables, whole grains, low-fat dairy products, and low-fat meat, fish, and poultry.  Limit fats such as oils, salad dressings, butter, nuts, and avocado.  Keep a food diary to identify foods that cause symptoms.  Avoid foods that cause reflux. These may be different for different people.  Eat frequent small meals instead of three large meals each day.  Eat your  meals slowly, in a relaxed setting.  Limit fried foods.  Cook foods using methods other than frying.  Avoid drinking alcohol.  Avoid drinking large amounts of liquids with your meals.  Avoid bending over or lying down until 2-3 hours after eating. What foods are not recommended? The following are some foods and drinks that may worsen your symptoms: Vegetables  Tomatoes. Tomato juice. Tomato and spaghetti sauce. Chili peppers. Onion and garlic. Horseradish. Fruits  Oranges, grapefruit, and lemon (fruit and juice). Meats  High-fat meats, fish, and poultry. This includes hot dogs, ribs, ham, sausage, salami, and bacon. Dairy  Whole milk and chocolate milk. Sour cream. Cream. Butter. Ice cream. Cream cheese. Beverages  Coffee and tea, with or without caffeine. Carbonated beverages or energy drinks. Condiments  Hot sauce. Barbecue sauce. Sweets/Desserts  Chocolate and cocoa. Donuts. Peppermint and spearmint. Fats and Oils  High-fat foods, including [place] fries and potato chips. Other  Vinegar. Strong spices, such as black pepper, white pepper, red pepper, cayenne, curry powder, cloves, ginger, and chili powder. The items listed above may not be a complete list of foods and beverages to avoid. Contact  your dietitian for more information.  This information is not intended to replace advice given to you by your health care provider. Make sure you discuss any questions you have with your health care provider. Document Released: 10/28/2005 Document Revised: 04/04/2016 Document Reviewed: 09/01/2013 Elsevier Interactive Patient Education  2017 [employer]

## 2016-12-23 NOTE — Progress Notes (Signed)
HPI:                                                                Rachael [name] is a 51 y.o. female who presents to [name]: Primary Care Sports Medicine today for HTN and anxiety follow-up  HTN: not on antihypertensive therapy. Currently taking OCP's for perimenopausal symptoms, which has elevated her BP in the past. Does not check BP's at home. Denies vision change, headache, chest pain with exertion, dyspnea, lightheadedness, and edema. Exercises regularly. Salt restricted diet.  Dyspepsia: she is currently taking Prilosec as needed. Reports worsening acid reflux with meals regardless of what she eats. She also notices some regurgitation when she is exercising.   Anxiety/Depression: reports that she has had anxiety and depression since her 25's. She has been on multiple SSRI's and SNRI's as well as Wellbutrin. The only medication she did not tolerate well was Abilify. She is currently taking Effexor 150mg  daily and Buspar 7.5mg  BID. Reports that she is doing well with Buspar, initially had some strange dreams and difficulty sleeping that has since resolved. She is only using Xanax 2-3 times per week.   Past Medical History:  Diagnosis Date  . Anxiety   . Depression   . Hypertension    Past Surgical History:  Procedure Laterality Date  . CESAREAN SECTION  01/04/1999   Social History  Substance Use Topics  . Smoking status: Never Smoker  . Smokeless tobacco: Never Used  . Alcohol use No   family history includes Asthma in her mother; COPD in her mother; Hyperlipidemia in her father; Hypertension in her father.  ROS: negative except as noted in the HPI  Medications: Current Outpatient Prescriptions  Medication Sig Dispense Refill  . ALPRAZolam (XANAX) 0.5 MG tablet Take 1 tablet (0.5 mg total) by mouth daily as needed for anxiety. 30 tablet 3  . Biotin (BIOTIN 5000) 5 MG CAPS Take by mouth.    . busPIRone (BUSPAR) 7.5 MG tablet Take 1 tablet (7.5 mg  total) by mouth 2 (two) times daily. 60 tablet 1  . Norethindrone Acetate-Ethinyl Estrad-FE (LOESTRIN 24 FE) 1-20 MG-MCG(24) tablet Take 1 tablet by mouth daily. 1 Package 11  . Probiotic Product (PROBIOTIC-10 PO) Take by mouth.    . venlafaxine XR (EFFEXOR-XR) 150 MG 24 hr capsule Take 1 capsule (150 mg total) by mouth daily. 90 capsule 1  . Vitamin D, Ergocalciferol, (DRISDOL) 50000 units CAPS capsule Take 1 capsule (50,000 Units total) by mouth every 7 (seven) days. 8 capsule 0   No current facility-administered medications for this visit.    Allergies  Allergen Reactions  . Abilify [Aripiprazole]     Panic attacks when coupled with cymbalta       Objective:  BP (!) 148/90   Pulse 73   Wt 153 lb (69.4 kg)   BMI 26.68 kg/m  Gen: well-groomed, cooperative, not ill-appearing, no distress Pulm: Normal work of breathing, normal phonation, clear to auscultation bilaterally, no wheezes, rales or rhonchi CV: Normal rate, regular rhythm, s1 and s2 distinct, no murmurs, clicks or rubs  Neuro: alert and oriented x 3, EOM's intact Skin: warm and dry, no rashes or lesions on exposed skin Psych: normal affect, pleasant mood, normal speech and thought content  [facility]  [facility] 12/23/2016 11/20/2016  Decreased Interest 2 1  Down, Depressed, Hopeless 2 1  PHQ - 2 Score 4 2  Altered sleeping 1 1  Tired, decreased energy 2 3  Change in appetite 1 0  Feeling bad or failure about yourself  3 3  Trouble concentrating 1 2  Moving slowly or fidgety/restless 1 0  Suicidal thoughts 0 0  PHQ-9 Score 13 11   GAD 7 : Generalized Anxiety Score 12/23/2016 11/20/2016  Nervous, Anxious, on Edge 2 3  Control/stop worrying 2 2  Worry too much - different things 3 2  Trouble relaxing 2 2  Restless 0 1  Easily annoyed or irritable 3 3  Afraid - awful might happen 2 1  Total GAD 7 Score 14 14       Assessment and Plan: 51 y.o. female with   Essential hypertension, benign - patient  prefers to discontinue OCP's than start antihypertensive treatment at this time - educated to use back-up contraception - she will purchase blood pressure cuff and check pressures at home for the next month - follow-up in 1 month  Gastroesophageal reflux disease, esophagitis presence not specified - ranitidine (ZANTAC) 300 MG tablet; Take 0.5 tablets (150 mg total) by mouth 2 (two) times daily.  Dispense: 180 tablet; Refill: 3  Moderate episode of recurrent major depressive disorder (HCC) -PHQ9 up from 11 to 13 today - tapering off of Effexor - patient is open to starting Cymbalta again. Given chronic depression, she likely has some tachyphylaxis and may benefit form augmentation with Wellbutrin - cont Buspar bid. refilled Xanax for panic attacks. She is doing well with limiting xanax use. - controlled substance contract signed - close follow-up in 1 month - DULoxetine (CYMBALTA) 20 MG capsule; Take 1 capsule (20 mg total) by mouth 2 (two) times daily.  Dispense: 60 capsule; Refill: 0 - venlafaxine XR (EFFEXOR XR) 37.5 MG 24 hr capsule; 2 tabs daily x 1 week, 1 tab daily x 1 week, then stop  Dispense: 30 capsule; Refill: 0 - ALPRAZolam (XANAX) 0.5 MG tablet; Take 1 tablet (0.5 mg total) by mouth as needed for anxiety.  Dispense: 30 tablet; Refill: 1  Patient education and anticipatory guidance given Patient agrees with treatment plan Follow-up in 1 month  Levonne Hubertharley E. Cummings PA-C

## 2016-12-29 ENCOUNTER — Encounter: Payer: Self-pay | Admitting: Physician Assistant

## 2017-01-14 ENCOUNTER — Other Ambulatory Visit: Payer: Self-pay | Admitting: Physician Assistant

## 2017-01-14 DIAGNOSIS — F419 Anxiety disorder, unspecified: Secondary | ICD-10-CM

## 2017-01-14 DIAGNOSIS — F329 Major depressive disorder, single episode, unspecified: Secondary | ICD-10-CM

## 2017-01-20 ENCOUNTER — Encounter: Payer: Self-pay | Admitting: Physician Assistant

## 2017-01-20 ENCOUNTER — Ambulatory Visit (INDEPENDENT_AMBULATORY_CARE_PROVIDER_SITE_OTHER): Payer: 59 | Admitting: Physician Assistant

## 2017-01-20 ENCOUNTER — Other Ambulatory Visit: Payer: Self-pay | Admitting: Physician Assistant

## 2017-01-20 VITALS — BP 144/84 | HR 76 | Wt 156.0 lb

## 2017-01-20 DIAGNOSIS — E782 Mixed hyperlipidemia: Secondary | ICD-10-CM | POA: Diagnosis not present

## 2017-01-20 DIAGNOSIS — F331 Major depressive disorder, recurrent, moderate: Secondary | ICD-10-CM

## 2017-01-20 DIAGNOSIS — E785 Hyperlipidemia, unspecified: Secondary | ICD-10-CM | POA: Insufficient documentation

## 2017-01-20 DIAGNOSIS — F3341 Major depressive disorder, recurrent, in partial remission: Secondary | ICD-10-CM | POA: Diagnosis not present

## 2017-01-20 DIAGNOSIS — N951 Menopausal and female climacteric states: Secondary | ICD-10-CM | POA: Diagnosis not present

## 2017-01-20 DIAGNOSIS — I10 Essential (primary) hypertension: Secondary | ICD-10-CM | POA: Diagnosis not present

## 2017-01-20 MED ORDER — NORETHIN ACE-ETH ESTRAD-FE 1-20 MG-MCG(24) PO TABS: 1.0000 | ORAL_TABLET | Freq: Every day | ORAL | 11 refills | Status: DC

## 2017-01-20 MED ORDER — DULOXETINE HCL 20 MG PO CPEP: 20.0000 mg | ORAL_CAPSULE | Freq: Two times a day (BID) | ORAL | 2 refills | Status: DC

## 2017-01-20 NOTE — Progress Notes (Signed)
HPI:                                                                Rachael [name] is a 51 y.o. female who presents to [name]: Primary Care Sports Medicine today for blood pressure and depression follow-up  Patient has been checking BP's at home. BP range 122-138/82-87. Denies vision change, headache, chest pain with exertion, dyspnea, lightheadedness, and edema. Patient discontinued her oral contraceptives last month to see if this would decrease blood pressure. She exercises regularly and is following the DASH eating plan. Risk factors include dyslipidemia. She is a nonsmoker, no hx of diabetes. Family hx significant for HTN and HLD in her father. Lab Results  Component Value Date   CHOL 202 (H) 09/09/2016   HDL 49 09/09/2016   LDLCALC 137 (H) 09/09/2016   TRIG 79 09/09/2016   CHOLHDL 4.1 09/09/2016     Patient transitioned from Effexor to Cymbalta without difficulty. She reports she is feeling improved. She is only using Xanax 1-2 times per month. Buspar is working well at controlling anxiety. Denies suicidal thinking. Denies symptoms of mania/hypomania. Denies AH/VH.    Past Medical History:  Diagnosis Date  . Anxiety   . Depression   . Hypertension    Past Surgical History:  Procedure Laterality Date  . CESAREAN SECTION  01/04/1999   Social History  Substance Use Topics  . Smoking status: Never Smoker  . Smokeless tobacco: Never Used  . Alcohol use No   family history includes Asthma in her mother; COPD in her mother; Hyperlipidemia in her father; Hypertension in her father.  ROS: negative except as noted in the HPI  Medications: Current Outpatient Prescriptions  Medication Sig Dispense Refill  . ALPRAZolam (XANAX) 0.5 MG tablet Take 1 tablet (0.5 mg total) by mouth as needed for anxiety. 30 tablet 1  . Biotin (BIOTIN 5000) 5 MG CAPS Take by mouth.    . busPIRone (BUSPAR) 7.5 MG tablet TAKE 1 TABLET BY MOUTH 2 TIMES A DAY 60 tablet 1  .  DULoxetine (CYMBALTA) 20 MG capsule Take 1 capsule (20 mg total) by mouth 2 (two) times daily. 60 capsule 0  . Probiotic Product (PROBIOTIC-10 PO) Take by mouth.    . ranitidine (ZANTAC) 300 MG tablet Take 0.5 tablets (150 mg total) by mouth 2 (two) times daily. 180 tablet 3  . Vitamin D, Ergocalciferol, (DRISDOL) 50000 units CAPS capsule TAKE 1 CAPSULE (50,000 UNITS TOTAL) BY MOUTH EVERY 7 (SEVEN) DAYS. 8 capsule 0   No current facility-administered medications for this visit.    Allergies  Allergen Reactions  . Abilify [Aripiprazole]     Panic attacks when coupled with cymbalta       Objective:  BP (!) 144/84   Pulse 76   Wt 156 lb (70.8 kg)   BMI 27.20 kg/m  Gen: well-groomed, cooperative, not ill-appearing, no distress Pulm: Normal work of breathing, normal phonation, clear to auscultation bilaterally CV: Normal rate, regular rhythm, s1 and s2 distinct, no murmurs, clicks or rubs  Neuro: alert and oriented x 3, EOM's intact Psych: good eye contact, appropriate affect, euthymic mood, normal speech and thought content   Depression screen [facility] 2/9 01/20/2017 12/23/2016 11/20/2016  Decreased Interest 1 2 1   Down,  Depressed, Hopeless 1 2 1   PHQ - 2 Score 2 4 2   Altered sleeping - 1 1  Tired, decreased energy - 2 3  Change in appetite - 1 0  Feeling bad or failure about yourself  - 3 3  Trouble concentrating - 1 2  Moving slowly or fidgety/restless - 1 0  Suicidal thoughts - 0 0  PHQ-9 Score - 13 11     Assessment and Plan: 51 y.o. female with  1. Recurrent major depressive disorder, in partial remission (HCC) - cont Cymbalta 20mg  bid and Buspar 7.5mg  bid - DULoxetine (CYMBALTA) 20 MG capsule; Take 1 capsule (20 mg total) by mouth 2 (two) times daily.  Dispense: 60 capsule; Refill: 2  2. Perimenopausal - restarting COC's - Norethindrone Acetate-Ethinyl Estrad-FE (LOESTRIN 24 FE) 1-20 MG-MCG(24) tablet; Take 1 tablet by mouth daily.  Dispense: 1 Package; Refill: 11  3.  Essential hypertension, stage I - patient's diastolic pressures in the stage I hypertensive range. 10-yr ASCVD risk 1.1% - cont DASH eating plan and physical activity - cont to log blood pressures at home. She admits that she was not checking blood pressure with proper method at home. Reviewed proper technique.   Patient education and anticipatory guidance given Patient agrees with treatment plan Follow-up in 3 months or sooner as needed   Levonne Hubertharley E. Jurgen Groeneveld PA-C

## 2017-01-20 NOTE — Patient Instructions (Addendum)
How to Take Your Blood Pressure Blood pressure is a measurement of how strongly your blood is pressing against the walls of your arteries. Arteries are blood vessels that carry blood from your heart throughout your body. Your health care provider takes your blood pressure at each office visit. You can also take your own blood pressure at home with a blood pressure machine. You may need to take your own blood pressure:  To confirm a diagnosis of high blood pressure (hypertension).  To monitor your blood pressure over time.  To make sure your blood pressure medicine is working. Supplies needed: To take your blood pressure, you will need a blood pressure machine. You can buy a blood pressure machine, or blood pressure monitor, at most drugstores or online. There are several types of home blood pressure monitors. When choosing one, consider the following:  Choose a monitor that has an arm cuff.  Choose a monitor that wraps snugly around your upper arm. You should be able to fit only one finger between your arm and the cuff.  Do not choose a monitor that measures your blood pressure from your wrist or finger. Your health care provider can suggest a reliable monitor that will meet your needs. How to prepare To get the most accurate reading, avoid the following for 30 minutes before you check your blood pressure:  Drinking caffeine.  Drinking alcohol.  Eating.  Smoking.  Exercising. Five minutes before you check your blood pressure:  Empty your bladder.  Sit quietly without talking in a dining chair, rather than in a soft couch or armchair. How to take your blood pressure To check your blood pressure, follow the instructions in the manual that came with your blood pressure monitor. If you have a digital blood pressure monitor, the instructions may be as follows: 1. Sit up straight. 2. Place your feet on the floor. Do not cross your ankles or legs. 3. Rest your left arm at the level  of your heart on a table or desk or on the arm of a chair. 4. Pull up your shirt sleeve. 5. Wrap the blood pressure cuff around the upper part of your left arm, 1 inch (2.5 cm) above your elbow. It is best to wrap the cuff around bare skin. 6. Fit the cuff snugly around your arm. You should be able to place only one finger between the cuff and your arm. 7. Position the cord inside the groove of your elbow. 8. Press the power button. 9. Sit quietly while the cuff inflates and deflates. 10. Read the digital reading on the monitor screen and write it down (record it). 11. Wait 2-3 minutes, then repeat the steps, starting at step 1. What does my blood pressure reading mean? A blood pressure reading consists of a higher number over a lower number. Ideally, your blood pressure should be below 120/80. The first ("top") number is called the systolic pressure. It is a measure of the pressure in your arteries as your heart beats. The second ("bottom") number is called the diastolic pressure. It is a measure of the pressure in your arteries as the heart relaxes. Blood pressure is classified into four stages. The following are the stages for adults who do not have a short-term serious illness or a chronic condition. Systolic pressure and diastolic pressure are measured in a unit called mm Hg. Normal   Systolic pressure: below 120.  Diastolic pressure: below 80. Elevated   Systolic pressure: 120-129.  Diastolic pressure: below  80. Hypertension stage 1   Systolic pressure: 130-139.  Diastolic pressure: 80-89. Hypertension stage 2   Systolic pressure: 140 or above.  Diastolic pressure: 90 or above. You can have prehypertension or hypertension even if only the systolic or only the diastolic number in your reading is higher than normal. Follow these instructions at home:  Check your blood pressure as often as recommended by your health care provider.  Take your monitor to the next appointment  with your health care provider to make sure:  That you are using it correctly.  That it provides accurate readings.  Be sure you understand what your goal blood pressure numbers are.  Tell your health care provider if you are having any side effects from blood pressure medicine. Contact a health care provider if:  Your blood pressure is consistently high. Get help right away if:  Your systolic blood pressure is higher than 180.  Your diastolic blood pressure is higher than 110. This information is not intended to replace advice given to you by your health care provider. Make sure you discuss any questions you have with your health care provider. Document Released: 04/05/2016 Document Revised: 06/18/2016 Document Reviewed: 04/05/2016 Elsevier Interactive Patient Education  2017 [employer]  Physical Activity Recommendations for modifying lipids and lowering blood pressure Engage in aerobic physical activity to reduce LDL-cholesterol, non-HDL-cholesterol, and blood pressure  Frequency: 3-4 sessions per week  Intensity: moderate to vigorous  Duration: 40 minutes on average  Physical Activity Recommendations for secondary prevention 1. Aerobic exercise  Frequency: 3-5 sessions per week  Intensity: 50-80% capacity  Duration: 20 - 60 minutes  Examples: walking, treadmill, cycling, rowing, stair climbing, and arm/leg ergometry  2. Resistance exercise  Frequency: 2-3 sessions per week  Intensity: 10-15 repetitions/set to moderate fatigue  Duration: 1-3 sets of 8-10 upper and lower body exercises  Examples: calisthenics, elastic bands, cuff/hand weights, dumbbels, free weights, wall pulleys, and weight machines  Heart-Healthy Lifestyle  Eating a diet rich in vegetables, fruits and whole grains: also includes low-fat dairy products, poultry, fish, legumes, and nuts; limit intake of sweets, sugar-sweetened beverages and red meats  Getting regular exercise  Maintaining  a healthy weight  Not smoking or getting help quitting  Staying on top of your health; for some people, lifestyle changes alone may not be enough to prevent a heart attack or stroke. In these cases, taking a statin at the right dose will most likely be necessary

## 2017-01-23 ENCOUNTER — Telehealth: Payer: Self-pay | Admitting: Physician Assistant

## 2017-01-23 MED ORDER — NORETHIN-ETH ESTRAD-FE BIPHAS 1 MG-10 MCG / 10 MCG PO TABS: 1.0000 | ORAL_TABLET | Freq: Every day | ORAL | 3 refills | Status: DC

## 2017-01-23 NOTE — Telephone Encounter (Signed)
Resent oral contraceptive Rx to pharmacy for 90-day supply per pharmacy request. Switched to biphasic, lower dose of estradiol

## 2017-02-27 ENCOUNTER — Other Ambulatory Visit: Payer: Self-pay | Admitting: Obstetrics & Gynecology

## 2017-02-27 DIAGNOSIS — R7989 Other specified abnormal findings of blood chemistry: Secondary | ICD-10-CM

## 2017-03-17 ENCOUNTER — Other Ambulatory Visit: Payer: Self-pay

## 2017-03-17 DIAGNOSIS — F3341 Major depressive disorder, recurrent, in partial remission: Secondary | ICD-10-CM

## 2017-03-17 MED ORDER — DULOXETINE HCL 20 MG PO CPEP: 20.0000 mg | ORAL_CAPSULE | Freq: Two times a day (BID) | ORAL | 2 refills | Status: DC

## 2017-03-18 ENCOUNTER — Other Ambulatory Visit: Payer: Self-pay | Admitting: Physician Assistant

## 2017-03-18 DIAGNOSIS — F419 Anxiety disorder, unspecified: Secondary | ICD-10-CM

## 2017-03-18 DIAGNOSIS — F329 Major depressive disorder, single episode, unspecified: Secondary | ICD-10-CM

## 2017-04-08 ENCOUNTER — Other Ambulatory Visit: Payer: Self-pay | Admitting: Physician Assistant

## 2017-04-08 DIAGNOSIS — Z1231 Encounter for screening mammogram for malignant neoplasm of breast: Secondary | ICD-10-CM

## 2017-04-09 ENCOUNTER — Ambulatory Visit (INDEPENDENT_AMBULATORY_CARE_PROVIDER_SITE_OTHER): Payer: 59

## 2017-04-09 ENCOUNTER — Other Ambulatory Visit: Payer: Self-pay | Admitting: Physician Assistant

## 2017-04-09 DIAGNOSIS — Z1231 Encounter for screening mammogram for malignant neoplasm of breast: Secondary | ICD-10-CM

## 2017-04-15 ENCOUNTER — Other Ambulatory Visit: Payer: Self-pay | Admitting: Physician Assistant

## 2017-04-15 DIAGNOSIS — F329 Major depressive disorder, single episode, unspecified: Secondary | ICD-10-CM

## 2017-04-15 DIAGNOSIS — F419 Anxiety disorder, unspecified: Secondary | ICD-10-CM

## 2017-04-22 ENCOUNTER — Encounter: Payer: Self-pay | Admitting: Physician Assistant

## 2017-04-22 ENCOUNTER — Ambulatory Visit (INDEPENDENT_AMBULATORY_CARE_PROVIDER_SITE_OTHER): Payer: 59 | Admitting: Physician Assistant

## 2017-04-22 VITALS — BP 134/83 | HR 77 | Wt 159.0 lb

## 2017-04-22 DIAGNOSIS — M25572 Pain in left ankle and joints of left foot: Secondary | ICD-10-CM | POA: Insufficient documentation

## 2017-04-22 DIAGNOSIS — E559 Vitamin D deficiency, unspecified: Secondary | ICD-10-CM | POA: Diagnosis not present

## 2017-04-22 DIAGNOSIS — F3341 Major depressive disorder, recurrent, in partial remission: Secondary | ICD-10-CM | POA: Diagnosis not present

## 2017-04-22 DIAGNOSIS — Z23 Encounter for immunization: Secondary | ICD-10-CM | POA: Diagnosis not present

## 2017-04-22 DIAGNOSIS — R635 Abnormal weight gain: Secondary | ICD-10-CM

## 2017-04-22 DIAGNOSIS — I10 Essential (primary) hypertension: Secondary | ICD-10-CM | POA: Diagnosis not present

## 2017-04-22 DIAGNOSIS — M79671 Pain in right foot: Secondary | ICD-10-CM | POA: Diagnosis not present

## 2017-04-22 DIAGNOSIS — M25571 Pain in right ankle and joints of right foot: Secondary | ICD-10-CM | POA: Insufficient documentation

## 2017-04-22 DIAGNOSIS — M79672 Pain in left foot: Secondary | ICD-10-CM

## 2017-04-22 MED ORDER — MOVE FREE JOINT HEALTH ADVANCE PO TABS: 2.0000 | ORAL_TABLET | Freq: Two times a day (BID) | ORAL | 11 refills | Status: DC

## 2017-04-22 MED ORDER — BUSPIRONE HCL 10 MG PO TABS: 10.0000 mg | ORAL_TABLET | Freq: Three times a day (TID) | ORAL | 2 refills | Status: DC

## 2017-04-22 NOTE — Patient Instructions (Addendum)
For mood: - Take Buspirone 10mg  twice a day, may increase to 15mg  twice a day after 1-2 weeks - Follow-up in 3 months or sooner as needed  If you experience any worsening symptoms or suicidal thoughts, call  [facility] Crisis Hotline [phone] go to the nearest emergency room or call 911 National Suicide Hotline 1-800-SUICIDE   For foot pain (metatarsalgia): - Rehab exercises daily - Continue Move Free supplement - Follow-up with Dr. Karie Schwalbe   For weight: - Continue regular cardiovascular exercise, 3-4, 30 minutes sessions per week of moderate intensity - Try to limit calories to 1300-1400 per day to lose 1 pound per week - Avoid alcohol - If no improvement or continued weight gain, can consider switching from Cymbalta to Viibryd or Trintellix, which have lower risk of weight gain - Follow-up in 3 months or sooner

## 2017-04-22 NOTE — Progress Notes (Signed)
HPI:                                                                Torrie MayersSheila Chilton is a 51 y.o. female who [facility] today for anxiety/depression follow-up and refills  HTN: controlled with therapeutic lifestyle changes. Checks BP's at home. BP range 120's/70's. Denies vision change, headache, chest pain with exertion, orthopnea, lightheadedness, syncope and edema. Risk factors include: borderline hypercholesteremia and hyperlipidemia  Depression/Anxiety: patient reports anxiety symptoms are very well controlled since starting Buspirone. She rarely takes Xanax (2 times per month). She feels depression symptoms could be better controlled. She is concerned about weight gain since starting Cymbalta. She is up 8 pounds despite regular exercise and watching her diet. Denies symptoms of mania/hypomania. Denies suicidal thinking. Denies auditory/visual hallucinations.  Foot pain: patient reports bilateral plantar forefoot pain present daily for approximately 1 year. She states it is worse when she is barefoot and exercising, such as in yoga class. Left foot is worse than right and she endorses the most pain in the ball of her left foot. She has tried [employer]"Move Free" supplement.  Past Medical History:  Diagnosis Date  . Anxiety   . Depression   . Hypertension    Past Surgical History:  Procedure Laterality Date  . CESAREAN SECTION  01/04/1999   Social History  Substance Use Topics  . Smoking status: Never Smoker  . Smokeless tobacco: Never Used  . Alcohol use No   family history includes Asthma in her mother; COPD in her mother; Hyperlipidemia in her father; Hypertension in her father.  ROS: negative except as noted in the HPI  Medications: Current Outpatient Prescriptions  Medication Sig Dispense Refill  . ALPRAZolam (XANAX) 0.5 MG tablet Take 1 tablet (0.5 mg total) by mouth as needed for anxiety. 30 tablet 1  . Biotin (BIOTIN  5000) 5 MG CAPS Take by mouth.    . busPIRone (BUSPAR) 7.5 MG tablet Take 1 tablet (7.5 mg total) by mouth 2 (two) times daily. Due for follow up visit 30 tablet 0  . DULoxetine (CYMBALTA) 20 MG capsule Take 1 capsule (20 mg total) by mouth 2 (two) times daily. 60 capsule 2  . Norethindrone-Ethinyl Estradiol-Fe Biphas (LO LOESTRIN FE) 1 MG-10 MCG / 10 MCG tablet Take 1 tablet by mouth daily. 90 tablet 3  . Probiotic Product (PROBIOTIC-10 PO) Take by mouth.    . ranitidine (ZANTAC) 300 MG tablet Take 0.5 tablets (150 mg total) by mouth 2 (two) times daily. 180 tablet 3  . Vitamin D, Ergocalciferol, (DRISDOL) 50000 units CAPS capsule TAKE 1 CAPSULE (50,000 UNITS TOTAL) BY MOUTH EVERY 7 (SEVEN) DAYS. 8 capsule 0   No current facility-administered medications for this visit.    Allergies  Allergen Reactions  . Abilify [Aripiprazole]     Panic attacks when coupled with cymbalta       Objective:  BP 134/83   Pulse 77   Wt 159 lb (72.1 kg)   BMI 27.72 kg/m  Gen: well-groomed, cooperative, not ill-appearing, no distress Pulm: Normal work of breathing, normal phonation, clear to auscultation bilaterally, no wheezes, rales or rhonchi CV: Normal rate, regular rhythm, s1 and s2 distinct, no murmurs, clicks or rubs; DP and  PT pulses 2+ and symmetric Neuro: alert and oriented x 3, EOM's intact, no tremor MSK: Feet - atraumatic, no visible swelling, there is tenderness of the left first ITP joint and the MTP joints bilaterally, strength intact, normal gait and station, no peripheral edema Lymph: no cervical or tonsillar adenopathy Skin: warm, dry, intact; no rashes, brisk capillary refill   No results found for this or any previous visit (from the past 72 hour(s)). No results found.    Assessment and Plan: 51 y.o. female with   1. Essential hypertension, benign - Stage I HTN - patient's home BP readings are in range - continue therapeutic lifestyle changes  2. Vitamin D  insufficiency - VITAMIN D 25 Hydroxy (Vit-D Deficiency, Fractures)  3. Pain in joints of both feet - differential includes Metatarsalgia versus plantar fasciitis - recommended wearing supportive shoes, rehab exercises for metatarsalgia and follow-up with Sports Medicine for possible metatarsal pad or custom orthotics - Glucos-Chond-Hyal Ac-Ca Fructo (MOVE FREE JOINT HEALTH ADVANCE) TABS; Take 2 tablets by mouth 2 (two) times daily.  Dispense: 60 tablet; Refill: 11  4. Recurrent major depressive disorder, in partial remission (HCC) - cont Cymbalta 40mg  daily - increasing Buspar to 10mg  bid for 1 week, then patient will increase to 15mg  bid based on response - busPIRone (BUSPAR) 10 MG tablet; Take 1 tablet (10 mg total) by mouth 3 (three) times daily.  Dispense: 90 tablet; Refill: 2  5. Need for Tdap vaccination - Tdap vaccine greater than or equal to 7yo IM  6. Abnormal weight gain - possible side effect of SNRI therapy - Continue regular cardiovascular exercise, 3-4, 30 minutes sessions per week of moderate intensity - Try to limit calories to 1300-1400 per day to lose 1 pound per week - Avoid alcohol - If no improvement or continued weight gain, can consider switching from Cymbalta to Viibryd or Trintellix, which have lower risk of weight gain - Follow-up in 3 months or sooner  Patient education and anticipatory guidance given Patient agrees with treatment plan Follow-up in 3 months or sooner as needed if symptoms worsen or fail to improve  Levonne Hubert PA-C

## 2017-04-28 ENCOUNTER — Ambulatory Visit (INDEPENDENT_AMBULATORY_CARE_PROVIDER_SITE_OTHER): Payer: 59

## 2017-04-28 ENCOUNTER — Encounter: Payer: Self-pay | Admitting: Sports Medicine

## 2017-04-28 ENCOUNTER — Ambulatory Visit (INDEPENDENT_AMBULATORY_CARE_PROVIDER_SITE_OTHER): Payer: 59 | Admitting: Sports Medicine

## 2017-04-28 DIAGNOSIS — M79671 Pain in right foot: Secondary | ICD-10-CM | POA: Diagnosis not present

## 2017-04-28 DIAGNOSIS — G8929 Other chronic pain: Secondary | ICD-10-CM | POA: Diagnosis not present

## 2017-04-28 DIAGNOSIS — M19079 Primary osteoarthritis, unspecified ankle and foot: Secondary | ICD-10-CM

## 2017-04-28 DIAGNOSIS — M2240 Chondromalacia patellae, unspecified knee: Secondary | ICD-10-CM | POA: Insufficient documentation

## 2017-04-28 DIAGNOSIS — M25561 Pain in right knee: Secondary | ICD-10-CM

## 2017-04-28 DIAGNOSIS — M25562 Pain in left knee: Secondary | ICD-10-CM | POA: Diagnosis not present

## 2017-04-28 DIAGNOSIS — M79672 Pain in left foot: Secondary | ICD-10-CM | POA: Diagnosis not present

## 2017-04-28 DIAGNOSIS — M19071 Primary osteoarthritis, right ankle and foot: Secondary | ICD-10-CM | POA: Insufficient documentation

## 2017-04-28 MED ORDER — MELOXICAM 15 MG PO TABS: ORAL_TABLET | ORAL | 3 refills | Status: DC

## 2017-04-28 NOTE — Assessment & Plan Note (Signed)
Bilateral knee joint effusion. Meloxicam, she will pickup knee sleeves, x-rays, physical therapy.  Return in one month, she'll also return for custom orthotics.

## 2017-04-28 NOTE — Progress Notes (Signed)
   Subjective:    I'm seeing this patient as a consultation for:  Gena Frayharley Cummings, PA-C  CC: bilateral foot pain, knee pain  HPI: Bilateral knee pain: Localized under the kneecaps, worse with squatting, moderate, persistent with mild gelling, no mechanical symptoms, mild swelling. Present for years.  Bilateral foot pain: Localized at the first MTP bilaterally, worse with terminal flexion, gelling, moderate, persistent without radiation.  Past medical history:  Negative.  See flowsheet/record as well for more information.  Surgical history: Negative.  See flowsheet/record as well for more information.  Family history: Negative.  See flowsheet/record as well for more information.  Social history: Negative.  See flowsheet/record as well for more information.  Allergies, and medications have been entered into the medical record, reviewed, and no changes needed.   Review of Systems: No headache, visual changes, nausea, vomiting, diarrhea, constipation, dizziness, abdominal pain, skin rash, fevers, chills, night sweats, weight loss, swollen lymph nodes, body aches, joint swelling, muscle aches, chest pain, shortness of breath, mood changes, visual or auditory hallucinations.   Objective:   General: Well Developed, well nourished, and in no acute distress.  Neuro/Psych: Alert and oriented x3, extra-ocular muscles intact, able to move all 4 extremities, sensation grossly intact. Skin: Warm and dry, no rashes noted.  Respiratory: Not using accessory muscles, speaking in full sentences, trachea midline.  Cardiovascular: Pulses palpable, no extremity edema. Abdomen: Does not appear distended. Bilateral knees: Minimally swollen right worse than left with a palpable effusion that is mild on the right side, no medial joint line pain but she does have bilateral medial and lateral patellar facet tenderness ROM normal in flexion and extension and lower leg rotation. Ligaments with solid consistent  endpoints including ACL, PCL, LCL, MCL. Negative Mcmurray's and provocative meniscal tests. Non painful patellar compression. Patellar and quadriceps tendons unremarkable. Hamstring and quadriceps strength is normal. Bilateral feet: No visible erythema or swelling. Range of motion is full in all directions. Strength is 5/5 in all directions. No hallux valgus. No pes cavus or pes planus. No abnormal callus noted. No pain over the navicular prominence, or base of fifth metatarsal. No tenderness to palpation of the calcaneal insertion of plantar fascia. No pain at the Achilles insertion. No pain over the calcaneal bursa. No pain of the retrocalcaneal bursa. No tenderness to palpation over the tarsals, metatarsals, or phalanges. No hallux rigidus or limitus. Tender to palpation at the first MTP bilaterally No pain with compression of the metatarsal heads. Neurovascularly intact distally.  Impression and Recommendations:   This case required medical decision making of moderate complexity.  Chondromalacia of patellofemoral joint Bilateral knee joint effusion. Meloxicam, she will pickup knee sleeves, x-rays, physical therapy.  Return in one month, she'll also return for custom orthotics.    Osteoarthritis of first metatarsophalangeal joint Bilateral, meloxicam, x-rays, return for orthotics with first metatarsal ray post in.

## 2017-04-28 NOTE — Assessment & Plan Note (Signed)
Bilateral, meloxicam, x-rays, return for orthotics with first metatarsal ray post in.

## 2017-05-05 ENCOUNTER — Ambulatory Visit (INDEPENDENT_AMBULATORY_CARE_PROVIDER_SITE_OTHER): Payer: 59 | Admitting: Sports Medicine

## 2017-05-05 DIAGNOSIS — M2240 Chondromalacia patellae, unspecified knee: Secondary | ICD-10-CM | POA: Diagnosis not present

## 2017-05-05 DIAGNOSIS — M19079 Primary osteoarthritis, unspecified ankle and foot: Secondary | ICD-10-CM

## 2017-05-05 NOTE — Assessment & Plan Note (Signed)
Bilateral, orthotics with first metatarsal ray post.

## 2017-05-05 NOTE — Progress Notes (Signed)
    Patient was fitted for a : standard, cushioned, semi-rigid orthotic. The orthotic was heated and afterward the patient stood on the orthotic blank positioned on the orthotic stand. The patient was positioned in subtalar neutral position and 10 degrees of ankle dorsiflexion in a weight bearing stance. After completion of molding, a stable base was applied to the orthotic blank. The blank was ground to a stable position for weight bearing. Size: 8 Base: [facility] Additional Posting and Padding: Soft blue first metatarsal ray post bilateral The patient ambulated these, and they were very comfortable.  I spent 40 minutes with this patient, greater than 50% was face-to-face time counseling regarding the below diagnosis.

## 2017-05-05 NOTE — Assessment & Plan Note (Signed)
Custom orthotics as above. Continue meloxicam.

## 2017-05-09 ENCOUNTER — Ambulatory Visit (INDEPENDENT_AMBULATORY_CARE_PROVIDER_SITE_OTHER): Payer: 59 | Admitting: Rehabilitative and Restorative Service Providers"

## 2017-05-09 DIAGNOSIS — R29898 Other symptoms and signs involving the musculoskeletal system: Secondary | ICD-10-CM | POA: Diagnosis not present

## 2017-05-09 DIAGNOSIS — M25561 Pain in right knee: Secondary | ICD-10-CM

## 2017-05-09 DIAGNOSIS — M25562 Pain in left knee: Secondary | ICD-10-CM

## 2017-05-09 DIAGNOSIS — G8929 Other chronic pain: Secondary | ICD-10-CM | POA: Diagnosis not present

## 2017-05-09 NOTE — Patient Instructions (Addendum)
HIP: Hamstrings - Supine   Place strap around foot. Raise leg up, keeping knee straight.  Bend opposite knee to protect back if indicated. Hold 30 seconds. 3 reps per set, 2-3 sets per day   Piriformis Stretch   Lying on back, pull right knee toward opposite shoulder. Hold 30 seconds. Repeat 3 times. Do 2-3 sessions per day.   Quads / HF, Supine   Lie near edge of bed, pull both knees up toward chest. Hold one knee as you drop the other leg off the edge of the bed.  Relax hanging knee/can bend knee back if indicated. Hold 30 seconds. Repeat 3 times per session. Do 2-3 sessions per day.    Quads / HF, Prone   Lie face down. Grasp one ankle with same-side hand. Use towel if needed to reach. Gently pull foot toward buttock.  Hold 30 seconds. Repeat 3 times per session. Do 2-3 sessions per day.   Self-Mobilization: Inward Kneecap Push    With entire length of index finger along outer border of left kneecap, gently push kneecap in toward other leg. Hold __15-30__ seconds. Repeat __4-5__ times per set. Do __1-2__ sessions per day.  Quad Set    With other leg bent, foot flat, slowly tighten muscles on thigh of straight leg while counting out loud to _10___. Repeat with other leg. Repeat _10___ times. Do __1-2__ sessions per day.   Straight Leg Raise: With External Leg Rotation    Lie on back with right leg straight, opposite leg bent. Rotate straight leg out and lift _10-12___ inches. Repeat __10__ times per set. Do __1-3__ sets per session. Do __1-2__ sessions per day.    Kinesiology tape What is kinesiology tape?  There are many brands of kinesiology tape.  KTape, [employer]ape, [employer] Sport, Dynamic tape, to name a few. It is an elasticized tape designed to support the body's natural healing process. This tape provides stability and support to muscles and joints without restricting motion. It can also help decrease swelling in the area of application. How does it  work? The tape microscopically lifts and decompresses the skin to allow for drainage of lymph (swelling) to flow away from area, reducing inflammation.  The tape has the ability to help re-educate the neuromuscular system by targeting specific receptors in the skin.  The presence of the tape increases the body's awareness of posture and body mechanics.  Contraindications: . Open wounds . Skin lesions . Adhesive allergies Safe removal of the tape: In some rare cases, mild/moderate skin irritation can occur.  This can include redness, itchiness, or hives. If this occurs, immediately remove tape and consult your primary care physician if symptoms are severe or do not resolve within 2 days.  To remove tape safely, hold nearby skin with one hand and gentle roll tape down with other hand.  You can apply oil or conditioner to tape while in shower prior to removal to loosen adhesive.  DO NOT swiftly rip tape off like a band-aid, as this could cause skin tears and additional skin irritation.

## 2017-05-09 NOTE — Therapy (Signed)
[facility] [place] [address] [place], [place], [address] Phone: [phone]   Fax:  [phone]  Physical Therapy Evaluation  Patient Details  Name: Rachael [name] MRN: [id number] Date of Birth: [birth year] Referring Provider: Dr Benjamin Stain  Encounter Date: 05/09/2017      PT End of Session - 05/09/17 0849    Visit Number 1   Number of Visits 12   Date for PT Re-Evaluation 06/20/17   PT Start Time 0845   PT Stop Time 0937   PT Time Calculation (min) 52 min   Activity Tolerance Patient tolerated treatment well      Past Medical History:  Diagnosis Date  . Anxiety   . Depression   . Hypertension     Past Surgical History:  Procedure Laterality Date  . CESAREAN SECTION  01/04/1999    There were no vitals filed for this visit.       Subjective Assessment - 05/09/17 0851    Subjective Patient reports that MD said she has arthritis in knees and great toes. She has had paiin in the knees in the past few years. Symptoms have increased in the past few months. Pain is increased with exercise and after exercise.    Pertinent History denies any musculoskeletal problems/injuries    How long can you sit comfortably? discomfort with crossing legs    How long can you stand comfortably? 15-20 min no problem   How long can you walk comfortably? walking OK pain with running    Diagnostic tests xrays - arthritis    Patient Stated Goals improve what she can to bew able to exercise with less or no pain    Currently in Pain? Yes   Pain Score 2    Pain Location Knee   Pain Orientation Left   Pain Descriptors / Indicators Dull;Nagging;Aching   Pain Type Chronic pain   Pain Radiating Towards under the knee cap    Pain Onset More than a month ago   Pain Frequency Intermittent   Aggravating Factors  running; walking; after exercise; crossing legs; weather changes; stairs (more descending steps); squatting; plank    Pain Relieving  Factors avoiding activities that cause pain; meds; ice            [name] PT Assessment - 05/09/17 0001      Assessment   Medical Diagnosis chondromalacia patella    Referring Provider Dr Benjamin Stain   Onset Date/Surgical Date 11/11/16   Hand Dominance Right   Next MD Visit 7/18   Prior Therapy none     Precautions   Precautions None     Balance Screen   Has the patient fallen in the past 6 months No   Has the patient had a decrease in activity level because of a fear of falling?  No   Is the patient reluctant to leave their home because of a fear of falling?  No     Home Environment   Additional Comments multilevel home - at times some discomfort with stairs      Prior Function   Level of Independence Independent   Vocation Other (comment)   Vocation Requirements household chores    Leisure exercise 4 times/wk - gym classes, eliptical, weights, walking, exercise at home      Observation/Other Assessments   Focus on Therapeutic Outcomes (FOTO)  32% limitation      Sensation   Additional Comments big toes - tingling at times      Posture/Postural  Control   Posture Comments head forward; shoudlers rounded and elevated; stands with knees hyperextended Rt > Lt      AROM   Overall AROM Comments WNL's bilat LE's      Strength   Overall Strength Comments 5/5 bilat LE's except hip abd 5-/5      Flexibility   Hamstrings tight ~ 75 deg bilat    Quadriceps tight bilat    ITB WFL's bilat    Piriformis tight bilat     Palpation   Patella mobility lateral tracking patella    Palpation comment tight lateral quad             Objective measurements completed on examination: See above findings.          [facility] Adult PT Treatment/Exercise - 05/09/17 0001      Therapeutic Activites    Therapeutic Activities --  instructed in deep tissue work/stick release lateral quads     Neuro Re-ed    Neuro Re-ed Details  working on standing without hyperextending       Knee/Hip Exercises: Stretches   Passive Hamstring Stretch 2 reps;30 seconds  supine with strap   Quad Stretch 2 reps;30 seconds  prone with strap   Hip Flexor Stretch 2 reps;30 seconds  supine knees to chest one LE dropped off edge of table    Hip Flexor Stretch Limitations hip flexor stretch sitting 30 sec x 1 each    Piriformis Stretch 2 reps;30 seconds  supine travell      Knee/Hip Exercises: Supine   Quad Sets Left;Both;1 set;5 reps  10 sec hold -  post taping for patella    Straight Leg Raise with External Rotation Right;Left;5 reps  5 - 10 sec hold post patella taping      Manual Therapy   Kinesiotex Inhibit Muscle;Facilitate Muscle  taping bilat knees correct lateral patellar alignment                PT Education - 05/09/17 0932    Education provided Yes   Education Details HEP taping    Person(s) Educated Patient   Methods Explanation;Demonstration;Tactile cues;Verbal cues;Handout   Comprehension Verbalized understanding;Returned demonstration;Verbal cues required;Tactile cues required             PT Long Term Goals - 05/09/17 1254      PT LONG TERM GOAL #1   Title Improve standing paosture and alignment with patient to demonstrate knee extension without hyperextension 06/20/17   Time 6   Period Weeks   Status New     PT LONG TERM GOAL #2   Title Improve patellar tracking with patient to report minimal to no pain with stairs; squatting; exercise 06/20/17   Time 6   Period Weeks   Status New     PT LONG TERM GOAL #3   Title Progress to appropriate gym and home exercise program 06/20/17   Time 6   Period Weeks   Status New     PT LONG TERM GOAL #4   Title Independent in HEP 06/20/17   Time 6   Period Weeks   Status New     PT LONG TERM GOAL #5   Title Improve FOTO to </= 27% limitation 06/20/17   Time 6   Period Weeks   Status New                Plan - 05/09/17 1251    Clinical Impression Statement shelia presents with bilat  knee pain Lt >  Rt. She has muscular tightness especially through the lateral quads; decreased mobility and tissue extensibility; muscular imbalance; poor patellar tracking and pain with functional activities. She will benefit form PT to address problems identified.    Clinical Presentation Stable   Clinical Decision Making Low   Rehab Potential Good   PT Frequency 2x / week   PT Duration 6 weeks   PT Treatment/Interventions Patient/family education;ADLs/Self Care Home Management;Cryotherapy;Electrical Stimulation;Iontophoresis 4mg /ml Dexamethasone;Moist Heat;Ultrasound;Dry needling;Manual techniques;Therapeutic activities;Therapeutic exercise;Neuromuscular re-education;Taping   PT Next Visit Plan continue work on neuromuscular re-ed to stop standing with knees hyperextended; taping to improve patellar tracking; deep tissue work lateral quad; strengthening; modalities as indicated    Consulted and Agree with Plan of Care Patient      Patient will benefit from skilled therapeutic intervention in order to improve the following deficits and impairments:  Postural dysfunction, Improper body mechanics, Pain, Increased fascial restricitons, Increased muscle spasms, Decreased mobility  Visit Diagnosis: Chronic pain of left knee - Plan: PT plan of care cert/re-cert  Chronic pain of right knee - Plan: PT plan of care cert/re-cert  Other symptoms and signs involving the musculoskeletal system - Plan: PT plan of care cert/re-cert     Problem List Patient Active Problem List   Diagnosis Date Noted  . 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 Osteoarthritis of first metatarsophalangeal joint 04/28/2017  . Recurrent major depressive disorder, in partial remission (HCC) 04/22/2017  . Abnormal weight gain 04/22/2017  . Borderline hyperlipidemia 01/20/2017  . Gastroesophageal reflux disease 12/23/2016  . Moderate episode of recurrent major depressive disorder (HCC) 12/23/2016  .  Perimenopausal 11/20/2016  . Chronic fatigue 11/20/2016  . Vitamin D insufficiency 08/03/2014  . Anxiety and depression 08/02/2014  . Thinning hair 08/02/2014  . Essential hypertension, benign 08/02/2014    Damara Klunder Rober Minion PT, MPH  05/09/2017, 1:06 PM  [facility] [address] [place], [place], [address] Phone: [phone]   Fax:  [phone]  Name: Rachael [name] MRN: [id number] Date of Birth: [birth year]

## 2017-05-15 ENCOUNTER — Ambulatory Visit (INDEPENDENT_AMBULATORY_CARE_PROVIDER_SITE_OTHER): Payer: 59 | Admitting: Physical Therapy

## 2017-05-15 DIAGNOSIS — R29898 Other symptoms and signs involving the musculoskeletal system: Secondary | ICD-10-CM | POA: Diagnosis not present

## 2017-05-15 DIAGNOSIS — M25562 Pain in left knee: Secondary | ICD-10-CM

## 2017-05-15 DIAGNOSIS — M25561 Pain in right knee: Secondary | ICD-10-CM | POA: Diagnosis not present

## 2017-05-15 DIAGNOSIS — G8929 Other chronic pain: Secondary | ICD-10-CM

## 2017-05-15 NOTE — Patient Instructions (Addendum)
Quadriceps (Stand)    Stand holding onto stationary object. Grasp right ankle. Pull knee back. Keep back straight. Do not rotate or arch back. Hold __30__ seconds. Repeat __2-3__ times. Do _2___ sessions per day. CAUTION: Stretch should be gentle, steady and slow.   Outer Hip Stretch: Reclined IT Band Stretch (Strap)    Strap around opposite foot, pull across only as far as possible with shoulders on mat. Hold for __30__ seconds. Repeat __2-3__ times each leg. TOES: Towel Bunching    With involved straight toes on towel, bend toes bunching up towel _many__ times. SCI SELF STRETCH: Toe Flexion / Extension    Bend hip and knee. Stretch toes forward. Hold _15__ seconds. Repeat backward. _5__ reps per set, __2_ sets per day  * massage roller stick for self massage.    [facility] [address] [place], [place] [address]  [phone] (office) [phone] (fax)

## 2017-05-15 NOTE — Therapy (Addendum)
[facility] [address] [place], [place], [address] Phone: [phone]   Fax:  [phone]  Physical Therapy Treatment  Patient Details  Name: Rachael MayersSheila [name] MRN: [id number] Date of Birth: 07/10/1966 Referring Provider: Dr. Benjamin Stainhekkekandam  Encounter Date: 05/15/2017      PT End of Session - 05/15/17 1442    Visit Number 2   Number of Visits 12   Date for PT Re-Evaluation 06/20/17   PT Start Time 1443   PT Stop Time 1545   PT Time Calculation (min) 62 min   Activity Tolerance Patient tolerated treatment well      Past Medical History:  Diagnosis Date  . Anxiety   . Depression   . Hypertension     Past Surgical History:  Procedure Laterality Date  . CESAREAN SECTION  01/04/1999    There were no vitals filed for this visit.      Subjective Assessment - 05/15/17 1450    Subjective Patient had a reaction to tape from last session after visiting the pool. Tape came off in the pool, and then area under the tape slightly blistered. Patient has already been to the gym today for an hour. Patient was a little sore at end of gym session, but was not having pain at time of PT sesison.   Pertinent History denies any musculoskeletal problems/injuries    How long can you sit comfortably? discomfort with crossing legs    How long can you stand comfortably? 15-20 min no problem   How long can you walk comfortably? walking OK pain with running    Diagnostic tests xrays - arthritis    Patient Stated Goals improve what she can to bew able to exercise with less or no pain    Currently in Pain? --   Pain Onset More than a month ago            [facility] PT Assessment - 05/15/17 0001      Assessment   Medical Diagnosis chondromalacia patella    Referring Provider Dr. Benjamin Stainhekkekandam   Onset Date/Surgical Date 11/11/16   Hand Dominance Right   Next MD Visit 06/04/17   Prior Therapy none     Flexibility   Hamstrings LLE 76 deg,  RLE 77 deg           [facility] Adult PT Treatment/Exercise - 05/15/17 0001      Knee/Hip Exercises: Stretches   Passive Hamstring Stretch 2 reps;30 seconds  supine with strap   Quad Stretch 2 reps;30 seconds  prone with strap    Hip Flexor Stretch 2 reps;30 seconds  supine knees to chest one LE dropped off edge of table    Hip Flexor Stretch Limitations hip flexor stretch sitting 30 sec x 1 each    Piriformis Stretch Right;Left;2 reps;30 seconds  One set in travell each side    Other Knee/Hip Stretches ITB stretch, supine with strap x 30 sec each leg.      Knee/Hip Exercises: Standing   Wall Squat 1 set;10 reps;5 seconds  With ball between legs     Knee/Hip Exercises: Supine   Quad Sets Right;1 set;5 reps  In short-sitting   Straight Leg Raise with External Rotation Right;Left;1 set;10 reps   Straight Leg Raise with External Rotation Limitations Shown progression on elbows and in long-sitting     Manual Therapy   Manual Therapy Soft tissue mobilization;Myofascial release   Soft tissue mobilization Edge tool assistance to bilat quads and ITB to  decrease fascial restrictions and pain   Myofascial Release To bilat quads                PT Education - 05/15/17 1537    Education provided Yes   Education Details HEP   Person(s) Educated Patient   Methods Handout;Explanation;Demonstration   Comprehension Verbalized understanding             PT Long Term Goals - 05/15/17 1608      PT LONG TERM GOAL #1   Title Improve standing paosture and alignment with patient to demonstrate knee extension without hyperextension 06/20/17   Time 6   Period Weeks   Status On-going     PT LONG TERM GOAL #2   Title Improve patellar tracking with patient to report minimal to no pain with stairs; squatting; exercise 06/20/17   Time 6   Period Weeks   Status On-going     PT LONG TERM GOAL #3   Title Progress to appropriate gym and home exercise program 06/20/17   Time 6   Period  Weeks   Status On-going     PT LONG TERM GOAL #4   Title Independent in HEP 06/20/17   Time 6   Period Weeks   Status On-going     PT LONG TERM GOAL #5   Title Improve FOTO to </= 27% limitation 06/20/17   Period Weeks   Status On-going               Plan - 05/15/17 1554    Clinical Impression Statement Patient had skin reaction to Dynamic tape; may try sensitive skin tape in future.  She reported less discomfort/ tightness in thighs and knees at end of session. Hamstrings remain tight bilaterally. Patient leaned toward her right (stronger) side while performing wall squats; req VC to correct to neutral. Patient is progressing toward all of her goals, and will benefit from further PT sessions to improve functional mobility and decrease pain.   Rehab Potential Good   PT Frequency 2x / week   PT Duration 6 weeks   PT Treatment/Interventions Patient/family education;ADLs/Self Care Home Management;Cryotherapy;Electrical Stimulation;Iontophoresis 4mg /ml Dexamethasone;Moist Heat;Ultrasound;Dry needling;Manual techniques;Therapeutic activities;Therapeutic exercise;Neuromuscular re-education;Taping   PT Next Visit Plan Discuss whether she would like to try taping again to improve patellar tracking, strengthen, manual therapy/modalities to lateral quad   Consulted and Agree with Plan of Care Patient      Patient will benefit from skilled therapeutic intervention in order to improve the following deficits and impairments:  Postural dysfunction, Improper body mechanics, Pain, Increased fascial restricitons, Increased muscle spasms, Decreased mobility  Visit Diagnosis: Chronic pain of left knee  Chronic pain of right knee  Other symptoms and signs involving the musculoskeletal system     Problem List Patient Active Problem List   Diagnosis Date Noted  . 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 Osteoarthritis of first metatarsophalangeal joint 04/28/2017  . Recurrent  major depressive disorder, in partial remission (HCC) 04/22/2017  . Abnormal weight gain 04/22/2017  . Borderline hyperlipidemia 01/20/2017  . Gastroesophageal reflux disease 12/23/2016  . Moderate episode of recurrent major depressive disorder (HCC) 12/23/2016  . Perimenopausal 11/20/2016  . Chronic fatigue 11/20/2016  . Vitamin D insufficiency 08/03/2014  . Anxiety and depression 08/02/2014  . Thinning hair 08/02/2014  . Essential hypertension, benign 08/02/2014    Kipp Laurence, SPTA 05/15/2017, 4:14 PM  Read, reviewed, edited and agree with student's findings and recommendations.  Mayer Camel, PTA 05/15/17 6:20 PM  [facility] [address] [place], [place], [address] Phone: [phone]   Fax:  [phone]  Name: Giselle Brutus MRN: [id number] Date of Birth: February 23, 1966

## 2017-05-16 ENCOUNTER — Encounter: Payer: Self-pay | Admitting: Rehabilitative and Restorative Service Providers"

## 2017-05-16 ENCOUNTER — Ambulatory Visit (INDEPENDENT_AMBULATORY_CARE_PROVIDER_SITE_OTHER): Payer: 59 | Admitting: Rehabilitative and Restorative Service Providers"

## 2017-05-16 DIAGNOSIS — M25562 Pain in left knee: Secondary | ICD-10-CM

## 2017-05-16 DIAGNOSIS — G8929 Other chronic pain: Secondary | ICD-10-CM

## 2017-05-16 DIAGNOSIS — M25561 Pain in right knee: Secondary | ICD-10-CM | POA: Diagnosis not present

## 2017-05-16 DIAGNOSIS — R29898 Other symptoms and signs involving the musculoskeletal system: Secondary | ICD-10-CM | POA: Diagnosis not present

## 2017-05-16 NOTE — Therapy (Signed)
[facility] [address] [place], [place], [address] Phone: [phone]   Fax:  [phone]  Physical Therapy Treatment  Patient Details  Name: Rachael MayersSheila [name] MRN: [id number] Date of Birth: 04/03/1966 Referring Provider: Dr. Benjamin Stainhekkekandam  Encounter Date: 05/16/2017      PT End of Session - 05/16/17 1332    Visit Number 3   Number of Visits 12   Date for PT Re-Evaluation 06/20/17   PT Start Time 1332   PT Stop Time 1405   PT Time Calculation (min) 33 min   Activity Tolerance Patient tolerated treatment well      Past Medical History:  Diagnosis Date  . Anxiety   . Depression   . Hypertension     Past Surgical History:  Procedure Laterality Date  . CESAREAN SECTION  01/04/1999    There were no vitals filed for this visit.      Subjective Assessment - 05/16/17 1339    Subjective Patient reports that her legs at sore. Yesterday was leg day in her exercise class and from the increase in exercise in PT. Did wake up this am with some increased pain.                          [facility] Adult PT Treatment/Exercise - 05/16/17 0001      Knee/Hip Exercises: Stretches   Passive Hamstring Stretch 30 seconds;1 rep  supine with strap   Quad Stretch 30 seconds;1 rep  prone with strap    Hip Flexor Stretch 30 seconds;1 rep  supine knees to chest one LE dropped off edge of table    Hip Flexor Stretch Limitations hip flexor stretch sitting 30 sec x 1 each    Piriformis Stretch Right;Left;2 reps;30 seconds  One set in travell each side    Other Knee/Hip Stretches ITB stretch, supine with strap x 30 sec each leg.      Knee/Hip Exercises: Aerobic   Nustep L5 x 4 min      Knee/Hip Exercises: Standing   Heel Raises 10 reps   Wall Squat 1 set;10 reps;5 seconds  With ball between legs   SLS 30 sec x 3 each      Knee/Hip Exercises: Supine   Quad Sets Right;1 set;5 reps  In short-sitting   Straight Leg  Raise with External Rotation Right;Left;1 set;10 reps     Manual Therapy   Manual Therapy Soft tissue mobilization;Myofascial release   Soft tissue mobilization deep tissue work lateral quads distally toward the lateral patella    Myofascial Release To bilat quads   Kinesiotex Inhibit Muscle;Facilitate Muscle                PT Education - 05/16/17 1406    Education provided Yes   Education Details HEP taping DN    Person(s) Educated Patient   Methods Explanation;Tactile cues;Demonstration;Verbal cues;Handout   Comprehension Verbalized understanding;Returned demonstration;Verbal cues required;Tactile cues required             PT Long Term Goals - 05/15/17 1608      PT LONG TERM GOAL #1   Title Improve standing paosture and alignment with patient to demonstrate knee extension without hyperextension 06/20/17   Time 6   Period Weeks   Status On-going     PT LONG TERM GOAL #2   Title Improve patellar tracking with patient to report minimal to no pain with stairs; squatting; exercise 06/20/17   Time 6  Period Weeks   Status On-going     PT LONG TERM GOAL #3   Title Progress to appropriate gym and home exercise program 06/20/17   Time 6   Period Weeks   Status On-going     PT LONG TERM GOAL #4   Title Independent in HEP 06/20/17   Time 6   Period Weeks   Status On-going     PT LONG TERM GOAL #5   Title Improve FOTO to </= 27% limitation 06/20/17   Period Weeks   Status On-going               Plan - 05/16/17 1341    Clinical Impression Statement Knees are sore from increased exercise yesterday. Less irritatiion and ready to try sensitive tape for patellar alignment.    PT Frequency 2x / week   PT Duration 6 weeks   PT Treatment/Interventions Patient/family education;ADLs/Self Care Home Management;Cryotherapy;Electrical Stimulation;Iontophoresis 4mg /ml Dexamethasone;Moist Heat;Ultrasound;Dry needling;Manual techniques;Therapeutic activities;Therapeutic  exercise;Neuromuscular re-education;Taping   PT Next Visit Plan assess response to tape for sensitive skin for patellar alignment. Continue with manual work and Hotel manager with Plan of Care Patient      Patient will benefit from skilled therapeutic intervention in order to improve the following deficits and impairments:  Postural dysfunction, Improper body mechanics, Pain, Increased fascial restricitons, Increased muscle spasms, Decreased mobility  Visit Diagnosis: Chronic pain of left knee  Chronic pain of right knee  Other symptoms and signs involving the musculoskeletal system     Problem List Patient Active Problem List   Diagnosis Date Noted  . 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 Osteoarthritis of first metatarsophalangeal joint 04/28/2017  . Recurrent major depressive disorder, in partial remission (HCC) 04/22/2017  . Abnormal weight gain 04/22/2017  . Borderline hyperlipidemia 01/20/2017  . Gastroesophageal reflux disease 12/23/2016  . Moderate episode of recurrent major depressive disorder (HCC) 12/23/2016  . Perimenopausal 11/20/2016  . Chronic fatigue 11/20/2016  . Vitamin D insufficiency 08/03/2014  . Anxiety and depression 08/02/2014  . Thinning hair 08/02/2014  . Essential hypertension, benign 08/02/2014    Celyn Rober Minion PT, MPH  05/16/2017, 3:00 PM  [facility] [address] [place], [place], [address] Phone: [phone]   Fax:  [phone]  Name: Rachael [name] MRN: [id number] Date of Birth: 10/02/1966

## 2017-05-16 NOTE — Patient Instructions (Addendum)
Balance: Unilateral    Attempt to balance on left leg, eyes open. Hold __30__ seconds. Repeat __3-5__ times per set. Do __2__ sessions per day. Perform exercise with eyes closed.  Trigger Point Dry Needling  . What is Trigger Point Dry Needling (DN)? o DN is a physical therapy technique used to treat muscle pain and dysfunction. Specifically, DN helps deactivate muscle trigger points (muscle knots).  o A thin filiform needle is used to penetrate the skin and stimulate the underlying trigger point. The goal is for a local twitch response (LTR) to occur and for the trigger point to relax. No medication of any kind is injected during the procedure.   . What Does Trigger Point Dry Needling Feel Like?  o The procedure feels different for each individual patient. Some patients report that they do not actually feel the needle enter the skin and overall the process is not painful. Very mild bleeding may occur. However, many patients feel a deep cramping in the muscle in which the needle was inserted. This is the local twitch response.   Marland Kitchen. How Will I feel after the treatment? o Soreness is normal, and the onset of soreness may not occur for a few hours. Typically this soreness does not last longer than two days.  o Bruising is uncommon, however; ice can be used to decrease any possible bruising.  o In rare cases feeling tired or nauseous after the treatment is normal. In addition, your symptoms may get worse before they get better, this period will typically not last longer than 24 hours.   . What Can I do After My Treatment? o Increase your hydration by drinking more water for the next 24 hours. o You may place ice or heat on the areas treated that have become sore, however, do not use heat on inflamed or bruised areas. Heat often brings more relief post needling. o You can continue your regular activities, but vigorous activity is not recommended initially after the treatment for 24 hours. o DN is  best combined with other physical therapy such as strengthening, stretching, and other therapies.

## 2017-05-21 ENCOUNTER — Ambulatory Visit (INDEPENDENT_AMBULATORY_CARE_PROVIDER_SITE_OTHER): Payer: 59 | Admitting: Physical Therapy

## 2017-05-21 DIAGNOSIS — M25562 Pain in left knee: Secondary | ICD-10-CM | POA: Diagnosis not present

## 2017-05-21 DIAGNOSIS — R29898 Other symptoms and signs involving the musculoskeletal system: Secondary | ICD-10-CM | POA: Diagnosis not present

## 2017-05-21 DIAGNOSIS — M25561 Pain in right knee: Secondary | ICD-10-CM | POA: Diagnosis not present

## 2017-05-21 DIAGNOSIS — G8929 Other chronic pain: Secondary | ICD-10-CM

## 2017-05-21 NOTE — Therapy (Signed)
[facility] [place] [address] [place], [place], [address] Phone: [phone]   Fax:  [phone]  Physical Therapy Treatment  Patient Details  Name: Rachael [name] MRN: [id number] Date of Birth: June 19, 1966 Referring Provider: Dr. Benjamin Stain  Encounter Date: 05/21/2017      PT End of Session - 05/21/17 1117    Visit Number 4   Number of Visits 12   Date for PT Re-Evaluation 06/20/17   PT Start Time 1021   PT Stop Time 1105   PT Time Calculation (min) 44 min   Activity Tolerance Patient tolerated treatment well   Behavior During Therapy [facility] for tasks assessed/performed      Past Medical History:  Diagnosis Date  . Anxiety   . Depression   . Hypertension     Past Surgical History:  Procedure Laterality Date  . CESAREAN SECTION  01/04/1999    There were no vitals filed for this visit.      Subjective Assessment - 05/21/17 1118    Subjective Pt reports she didn't have any reaction to the sensitive skin Rock tape. She left it on 4 days.  She is noticing a little less pain with exercise; able to do more at gym.  Running is still painful.    Patient Stated Goals improve what she can to bew able to exercise with less or no pain    Currently in Pain? No/denies   Pain Score 0-No pain            [facility] PT Assessment - 05/21/17 0001      Assessment   Medical Diagnosis chondromalacia patella    Referring Provider Dr. Benjamin Stain   Onset Date/Surgical Date 11/11/16   Hand Dominance Right   Next MD Visit 06/04/17     Flexibility   Hamstrings RLE 87 deg, LLE 79 deg   Quadriceps Rt knee 134 deg, Lt knee   prone, during quad stretch            [facility] Adult PT Treatment/Exercise - 05/21/17 0001      Knee/Hip Exercises: Stretches   Passive Hamstring Stretch 30 seconds;Right;Left;2 reps  supine with strap   Quad Stretch 30 seconds;Right;Left;2 reps  prone with strap, towel above knee     Piriformis Stretch  Right;Left;2 reps;30 seconds   Other Knee/Hip Stretches ITB stretch, supine with strap x 30 sec each leg.      Knee/Hip Exercises: Aerobic   Nustep L5 x 5 min      Knee/Hip Exercises: Supine   Straight Leg Raise with External Rotation Right;Left;1 set;10 reps  2 sets   Straight Leg Raise with External Rotation Limitations supine on elbows, 2nd set in long sitting.  performed with hip abd/add.      Manual Therapy   Manual therapy comments Sensitive skin Rock tape applied to lateral portion of each knee (horseshoe shape, pulling medially with 20% stretch), perpendicular I strip pulling lateral to medial.     Soft tissue mobilization Edge tool assistance to bilat quads and ITB to decrease fascial restrictions and pain                PT Education - 05/21/17 1114    Education provided Yes   Education Details Educated pt regarding small modifications of jogging pattern, including slowing speed, decreasing stride length, and stopping to stretch quads mid-run - to decrease pain.    Person(s) Educated Patient   Methods Explanation   Comprehension Verbalized understanding  PT Long Term Goals - 05/15/17 1608      PT LONG TERM GOAL #1   Title Improve standing paosture and alignment with patient to demonstrate knee extension without hyperextension 06/20/17   Time 6   Period Weeks   Status On-going     PT LONG TERM GOAL #2   Title Improve patellar tracking with patient to report minimal to no pain with stairs; squatting; exercise 06/20/17   Time 6   Period Weeks   Status On-going     PT LONG TERM GOAL #3   Title Progress to appropriate gym and home exercise program 06/20/17   Time 6   Period Weeks   Status On-going     PT LONG TERM GOAL #4   Title Independent in HEP 06/20/17   Time 6   Period Weeks   Status On-going     PT LONG TERM GOAL #5   Title Improve FOTO to </= 27% limitation 06/20/17   Period Weeks   Status On-going               Plan -  05/21/17 1115    Clinical Impression Statement Pt's Rt hamstring flexibility has improved.  Less palpable tightness in lateral quads today with manual therapy.  Pt making gains towards established goals.    Rehab Potential Good   PT Frequency 2x / week   PT Duration 6 weeks   PT Treatment/Interventions Patient/family education;ADLs/Self Care Home Management;Cryotherapy;Electrical Stimulation;Iontophoresis 4mg /ml Dexamethasone;Moist Heat;Ultrasound;Dry needling;Manual techniques;Therapeutic activities;Therapeutic exercise;Neuromuscular re-education;Taping   PT Next Visit Plan Trial of jog/walk to assess; manual therapy to quads/ITB; assess current gym exercises to modify painful ones.    Consulted and Agree with Plan of Care Patient      Patient will benefit from skilled therapeutic intervention in order to improve the following deficits and impairments:  Postural dysfunction, Improper body mechanics, Pain, Increased fascial restricitons, Increased muscle spasms, Decreased mobility  Visit Diagnosis: Chronic pain of left knee  Chronic pain of right knee  Other symptoms and signs involving the musculoskeletal system     Problem List Patient Active Problem List   Diagnosis Date Noted  . 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 Osteoarthritis of first metatarsophalangeal joint 04/28/2017  . Recurrent major depressive disorder, in partial remission (HCC) 04/22/2017  . Abnormal weight gain 04/22/2017  . Borderline hyperlipidemia 01/20/2017  . Gastroesophageal reflux disease 12/23/2016  . Moderate episode of recurrent major depressive disorder (HCC) 12/23/2016  . Perimenopausal 11/20/2016  . Chronic fatigue 11/20/2016  . Vitamin D insufficiency 08/03/2014  . Anxiety and depression 08/02/2014  . Thinning hair 08/02/2014  . Essential hypertension, benign 08/02/2014   Mayer CamelJennifer Carlson-Long, PTA 05/21/17 11:23 AM  [facility] [address] [place], [place], [address] Phone: [phone]   Fax:  [phone]  Name: Rachael MayersSheila [name] MRN: [id number] Date of Birth: 08/03/1966

## 2017-05-23 ENCOUNTER — Ambulatory Visit (INDEPENDENT_AMBULATORY_CARE_PROVIDER_SITE_OTHER): Payer: 59 | Admitting: Rehabilitative and Restorative Service Providers"

## 2017-05-23 ENCOUNTER — Encounter: Payer: Self-pay | Admitting: Rehabilitative and Restorative Service Providers"

## 2017-05-23 DIAGNOSIS — M25562 Pain in left knee: Secondary | ICD-10-CM

## 2017-05-23 DIAGNOSIS — M25561 Pain in right knee: Secondary | ICD-10-CM

## 2017-05-23 DIAGNOSIS — R29898 Other symptoms and signs involving the musculoskeletal system: Secondary | ICD-10-CM

## 2017-05-23 DIAGNOSIS — G8929 Other chronic pain: Secondary | ICD-10-CM

## 2017-05-23 NOTE — Patient Instructions (Signed)

## 2017-05-23 NOTE — Therapy (Signed)
[facility] [address] [place], [place], [address] Phone: [phone]   Fax:  [phone]  Physical Therapy Treatment  Patient Details  Name: Rachael MayersSheila [name] MRN: [id number] Date of Birth: 12/27/1965 Referring Provider: Dr. Benjamin Stainhekkekandam  Encounter Date: 05/23/2017      PT End of Session - 05/23/17 0924    Visit Number 5   Number of Visits 12   Date for PT Re-Evaluation 06/20/17   PT Start Time 0924   PT Stop Time 1013   PT Time Calculation (min) 49 min   Activity Tolerance Patient tolerated treatment well      Past Medical History:  Diagnosis Date  . Anxiety   . Depression   . Hypertension     Past Surgical History:  Procedure Laterality Date  . CESAREAN SECTION  01/04/1999    There were no vitals filed for this visit.      Subjective Assessment - 05/23/17 0925    Subjective Patient reports that her knees may be a little better overall She notices the knee pain with exercise in the gym, less so with normal daily activities. She has some "soreness" with stairs but not really pain.    Currently in Pain? No/denies                         [facility]ercise - 05/23/17 0001      Knee/Hip Exercises: Stretches   Passive Hamstring Stretch 30 seconds;Right;Left;2 reps  supine with strap   Quad Stretch 30 seconds;Right;Left;2 reps  prone with strap, towel above knee     Piriformis Stretch Right;Left;2 reps;30 seconds   Other Knee/Hip Stretches ITB stretch, supine with strap x 30 sec each leg.      Knee/Hip Exercises: Aerobic   Tread Mill 2.8 to ~ 4.0 mph walk to run x ~ 5 min      Manual Therapy   Manual therapy comments Sensitive skin Rock tape applied to lateral portion of each knee (horseshoe shape, pulling medially with 20% stretch), perpendicular I strip pulling lateral to medial.     Soft tissue mobilization deep tissue work through lateral hip into lateral patellar  area                PT Education - 05/23/17 0951    Education provided Yes   Education Details discussed DN; activity and exercise modification    Person(s) Educated Patient   Methods Explanation   Comprehension Verbalized understanding             PT Long Term Goals - 05/23/17 0924      PT LONG TERM GOAL #1   Title Improve standing posture and alignment with patient to demonstrate knee extension without hyperextension 06/20/17   Time 6   Period Weeks   Status On-going     PT LONG TERM GOAL #2   Title Improve patellar tracking with patient to report minimal to no pain with stairs; squatting; exercise 06/20/17   Time 6   Period Weeks   Status On-going     PT LONG TERM GOAL #3   Title Progress to appropriate gym and home exercise program 06/20/17   Time 6   Period Weeks   Status On-going     PT LONG TERM GOAL #4   Title Independent in HEP 06/20/17   Time 6   Period Weeks   Status On-going     PT LONG TERM GOAL #5  Title Improve FOTO to </= 27% limitation 06/20/17   Time 6   Period Weeks   Status On-going               Plan - 05/23/17 0930    Clinical Impression Statement Hamstring flexibilty continues to improve. There is decreased tightness to palpation through lateral quads and ITB with manual work. Added myofacial ball release lateral hip and thigh. Discussed DN. Assessment of gait walking to running - some abnormal placement of Lt foot with adduction and IR of Lt hip > Rt. Patient will continue with stretching; myofacial release this week. No gym exercise while on vacation. Will assess response.    Rehab Potential Good   PT Frequency 2x / week   PT Duration 6 weeks   PT Treatment/Interventions Patient/family education;ADLs/Self Care Home Management;Cryotherapy;Electrical Stimulation;Iontophoresis 4mg /ml Dexamethasone;Moist Heat;Ultrasound;Dry needling;Manual techniques;Therapeutic activities;Therapeutic exercise;Neuromuscular re-education;Taping    PT Next Visit Plan Trial of jog/walk to assess; manual therapy to quads/ITB; assess current gym exercises to modify painful ones. Consider DN if lateral tightness continues    Consulted and Agree with Plan of Care Patient      Patient will benefit from skilled therapeutic intervention in order to improve the following deficits and impairments:  Postural dysfunction, Improper body mechanics, Pain, Increased fascial restricitons, Increased muscle spasms, Decreased mobility  Visit Diagnosis: Chronic pain of left knee  Chronic pain of right knee  Other symptoms and signs involving the musculoskeletal system     Problem List Patient Active Problem List   Diagnosis Date Noted  . 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 Osteoarthritis of first metatarsophalangeal joint 04/28/2017  . Recurrent major depressive disorder, in partial remission (HCC) 04/22/2017  . Abnormal weight gain 04/22/2017  . Borderline hyperlipidemia 01/20/2017  . Gastroesophageal reflux disease 12/23/2016  . Moderate episode of recurrent major depressive disorder (HCC) 12/23/2016  . Perimenopausal 11/20/2016  . Chronic fatigue 11/20/2016  . Vitamin D insufficiency 08/03/2014  . Anxiety and depression 08/02/2014  . Thinning hair 08/02/2014  . Essential hypertension, benign 08/02/2014    Bella Brummet Rober Minion PT, MPH  05/23/2017, 10:19 AM  [facility] [address] [place], [place], [address] Phone: [phone]   Fax:  [phone]  Name: Rachael [name] MRN: [id number] Date of Birth: 11-02-1966

## 2017-06-01 ENCOUNTER — Other Ambulatory Visit: Payer: Self-pay | Admitting: Obstetrics & Gynecology

## 2017-06-01 DIAGNOSIS — R7989 Other specified abnormal findings of blood chemistry: Secondary | ICD-10-CM

## 2017-06-04 ENCOUNTER — Ambulatory Visit (INDEPENDENT_AMBULATORY_CARE_PROVIDER_SITE_OTHER): Payer: 59 | Admitting: Sports Medicine

## 2017-06-04 ENCOUNTER — Encounter: Payer: Self-pay | Admitting: Sports Medicine

## 2017-06-04 DIAGNOSIS — R635 Abnormal weight gain: Secondary | ICD-10-CM

## 2017-06-04 DIAGNOSIS — M19079 Primary osteoarthritis, unspecified ankle and foot: Secondary | ICD-10-CM | POA: Diagnosis not present

## 2017-06-04 DIAGNOSIS — M2240 Chondromalacia patellae, unspecified knee: Secondary | ICD-10-CM | POA: Diagnosis not present

## 2017-06-04 MED ORDER — PHENTERMINE HCL 37.5 MG PO TABS: ORAL_TABLET | ORAL | 0 refills | Status: DC

## 2017-06-04 NOTE — Assessment & Plan Note (Signed)
Pain essentially resolved with custom orthotics and meloxicam.

## 2017-06-04 NOTE — Progress Notes (Signed)
  Subjective:    CC: Follow-up  HPI: Patellofemoral chondral malacia: Improved significantly with physical therapy, anti-inflammatories. Still has some pain on runs.  Metatarsophalangeal joint osteoarthritis: Pain resolved with custom orthotics and meloxicam.  Abnormal weight gain: Currently in the overweight category, having difficulty losing weight on her own.  Past medical history:  Negative.  See flowsheet/record as well for more information.  Surgical history: Negative.  See flowsheet/record as well for more information.  Family history: Negative.  See flowsheet/record as well for more information.  Social history: Negative.  See flowsheet/record as well for more information.  Allergies, and medications have been entered into the medical record, reviewed, and no changes needed.   Review of Systems: No fevers, chills, night sweats, weight loss, chest pain, or shortness of breath.   Objective:    General: Well Developed, well nourished, and in no acute distress.  Neuro: Alert and oriented x3, extra-ocular muscles intact, sensation grossly intact.  HEENT: Normocephalic, atraumatic, pupils equal round reactive to light, neck supple, no masses, no lymphadenopathy, thyroid nonpalpable.  Skin: Warm and dry, no rashes. Cardiac: Regular rate and rhythm, no murmurs rubs or gallops, no lower extremity edema.  Respiratory: Clear to auscultation bilaterally. Not using accessory muscles, speaking in full sentences.  Impression and Recommendations:    Chondromalacia of patellofemoral joint Improving considerably, still has weak hip abductor's, she is going to work on these in the gym. Return in one month to consider injection. I'm also going to help her with weight loss.  Osteoarthritis of first metatarsophalangeal joint Pain essentially resolved with custom orthotics and meloxicam.  Abnormal weight gain Body mass index of 26, currently overweight. She does have a comorbidity of knee  pain and osteoarthritis. Adding phentermine, return in one month for a weight check, at which point we will have her follow-up with her PCP.  I spent 25 minutes with this patient, greater than 50% was face-to-face time counseling regarding the above diagnoses

## 2017-06-04 NOTE — Assessment & Plan Note (Signed)
Body mass index of 26, currently overweight. She does have a comorbidity of knee pain and osteoarthritis. Adding phentermine, return in one month for a weight check, at which point we will have her follow-up with her PCP.

## 2017-06-04 NOTE — Assessment & Plan Note (Signed)
Improving considerably, still has weak hip abductor's, she is going to work on these in the gym. Return in one month to consider injection. I'm also going to help her with weight loss.

## 2017-06-05 ENCOUNTER — Ambulatory Visit (INDEPENDENT_AMBULATORY_CARE_PROVIDER_SITE_OTHER): Payer: 59 | Admitting: Physical Therapy

## 2017-06-05 DIAGNOSIS — M25562 Pain in left knee: Secondary | ICD-10-CM | POA: Diagnosis not present

## 2017-06-05 DIAGNOSIS — G8929 Other chronic pain: Secondary | ICD-10-CM

## 2017-06-05 DIAGNOSIS — R29898 Other symptoms and signs involving the musculoskeletal system: Secondary | ICD-10-CM | POA: Diagnosis not present

## 2017-06-05 NOTE — Therapy (Signed)
[facility] [address] [place] [address] [place], [place], [address] Phone: [phone]   Fax:  [phone]  Physical Therapy Treatment  Patient Details  Name: Rachael [name] MRN: [id number] Date of Birth: 05/17/1966 Referring Provider: Dr. Benjamin Stainhekkekandam  Encounter Date: 06/05/2017      PT End of Session - 06/05/17 1709    Visit Number 6   Number of Visits 12   Date for PT Re-Evaluation 06/20/17   PT Start Time 1703   PT Stop Time 1747   PT Time Calculation (min) 44 min   Activity Tolerance Patient tolerated treatment well   Behavior During Therapy [facility] for tasks assessed/performed      Past Medical History:  Diagnosis Date  . Anxiety   . Depression   . Hypertension     Past Surgical History:  Procedure Laterality Date  . CESAREAN SECTION  01/04/1999    There were no vitals filed for this visit.      Subjective Assessment - 06/05/17 1704    Subjective Pt returns from beach vacation.  She performed walk/jog 2 miles, 5 days with minimal pain.  She visited MD yesterday; he would like her to decrease freq to 1x/wk and focus on strengthening her hip abductors.     Currently in Pain? Yes   Pain Score 4    Pain Location Knee   Pain Orientation Left   Pain Descriptors / Indicators Dull;Nagging   Aggravating Factors  Stairs   Pain Relieving Factors avoiding activities that cause pain, ice.             [facility] - 06/05/17 0001      Assessment   Medical Diagnosis chondromalacia patella    Referring Provider Dr. Benjamin Stainhekkekandam   Onset Date/Surgical Date 11/11/16   Hand Dominance Right   Next MD Visit 07/02/17     Flexibility   Hamstrings RLE 87 deg, LLE 81 deg           [facility] Adult PT Treatment/Exercise - 06/05/17 0001      Knee/Hip Exercises: Stretches   Passive Hamstring Stretch Right;Left;4 reps;30 seconds  supine with strap     Knee/Hip Exercises: Aerobic   Elliptical L3: 5 min      Knee/Hip  Exercises: Supine   Single Leg Bridge Right;Left;1 set;5 reps     Knee/Hip Exercises: Sidelying   Hip ABduction Strengthening;Left;1 set;15 reps  core engaged   Clams ball between feet x 12 reps, core engaged     Modalities   Modalities Ultrasound     Ultrasound   Ultrasound Location Lt superior/lateral quad    Ultrasound Parameters 100%, 1/1 w/cm2, 8 min    Ultrasound Goals Pain;Other (Comment)  tightness     Manual Therapy   Manual therapy comments Sensitive skin Rock tape applied to lateral portion of each knee (horseshoe shape, pulling medially with 20% stretch),    Soft tissue mobilization deep tissue work through lateral hip into lateral patellar area                     PT Long Term Goals - 06/05/17 1754      PT LONG TERM GOAL #1   Title Improve standing posture and alignment with patient to demonstrate knee extension without hyperextension 06/20/17   Time 6   Period Weeks   Status On-going  improving     PT LONG TERM GOAL #2   Title Improve patellar tracking with patient to report minimal to  no pain with stairs; squatting; exercise 06/20/17   Time 6   Period Weeks   Status On-going     PT LONG TERM GOAL #3   Title Progress to appropriate gym and home exercise program 06/20/17   Time 6   Period Weeks   Status On-going     PT LONG TERM GOAL #4   Title Independent in HEP 06/20/17   Time 6   Period Weeks   Status On-going     PT LONG TERM GOAL #5   Title Improve FOTO to </= 27% limitation 06/20/17   Time 6   Period Weeks   Status On-going               Plan - 06/05/17 1754    Clinical Impression Statement Pt's hamstring flexibility slightly improved on Lt this visit.  Some banding with tenderness noted on her Lt lateral distal quad.  Pt reported elimination of pain at end of session.   Pt making gradual gains towards established goals.    Rehab Potential Good   PT Frequency 2x / week   PT Duration 6 weeks   PT Treatment/Interventions  Patient/family education;ADLs/Self Care Home Management;Cryotherapy;Electrical Stimulation;Iontophoresis 4mg /ml Dexamethasone;Moist Heat;Ultrasound;Dry needling;Manual techniques;Therapeutic activities;Therapeutic exercise;Neuromuscular re-education;Taping   PT Next Visit Plan Assess response to new exercises and tape on knee.    Consulted and Agree with Plan of Care Patient      Patient will benefit from skilled therapeutic intervention in order to improve the following deficits and impairments:  Postural dysfunction, Improper body mechanics, Pain, Increased fascial restricitons, Increased muscle spasms, Decreased mobility  Visit Diagnosis: Chronic pain of left knee  Other symptoms and signs involving the musculoskeletal system     Problem List Patient Active Problem List   Diagnosis Date Noted  . 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 Osteoarthritis of first metatarsophalangeal joint 04/28/2017  . Recurrent major depressive disorder, in partial remission (HCC) 04/22/2017  . Abnormal weight gain 04/22/2017  . Borderline hyperlipidemia 01/20/2017  . Gastroesophageal reflux disease 12/23/2016  . Moderate episode of recurrent major depressive disorder (HCC) 12/23/2016  . Perimenopausal 11/20/2016  . Chronic fatigue 11/20/2016  . Vitamin D insufficiency 08/03/2014  . Anxiety and depression 08/02/2014  . Thinning hair 08/02/2014  . Essential hypertension, benign 08/02/2014   Mayer CamelJennifer Carlson-Long, PTA 06/05/17 5:59 PM  [facility] [address] [place] [address] [place], [place], [address] Phone: [phone]   Fax:  [phone]  Name: Rachael [name] MRN: [id number] Date of Birth: 01/23/1966

## 2017-06-12 ENCOUNTER — Ambulatory Visit (INDEPENDENT_AMBULATORY_CARE_PROVIDER_SITE_OTHER): Payer: 59 | Admitting: Physical Therapy

## 2017-06-12 DIAGNOSIS — R29898 Other symptoms and signs involving the musculoskeletal system: Secondary | ICD-10-CM | POA: Diagnosis not present

## 2017-06-12 DIAGNOSIS — M25562 Pain in left knee: Secondary | ICD-10-CM | POA: Diagnosis not present

## 2017-06-12 DIAGNOSIS — G8929 Other chronic pain: Secondary | ICD-10-CM | POA: Diagnosis not present

## 2017-06-12 NOTE — Therapy (Signed)
[facility] [address] [place] [address] [place], [place], [address] Phone: [phone]   Fax:  [phone]  Physical Therapy Treatment  Patient Details  Name: Rachael MayersSheila Sumlin MRN: [id number] Date of Birth: 11/08/1966 Referring Provider: Dr. Benjamin Stainhekkekandam  Encounter Date: 06/12/2017      PT End of Session - 06/12/17 1538    Visit Number 7   Number of Visits 12   Date for PT Re-Evaluation 06/20/17   PT Start Time 1445   PT Stop Time 1530   PT Time Calculation (min) 45 min   Activity Tolerance Patient tolerated treatment well   Behavior During Therapy [facility] for tasks assessed/performed      Past Medical History:  Diagnosis Date  . Anxiety   . Depression   . Hypertension     Past Surgical History:  Procedure Laterality Date  . CESAREAN SECTION  01/04/1999    There were no vitals filed for this visit.      Subjective Assessment - 06/12/17 1539    Subjective pt reports she has been stretching daily and continues to work out in group classes. minimal running (too busy).  She notices pain in Lt knee with plank exercises and stairs.     Patient Stated Goals improve what she can to bew able to exercise with less or no pain    Currently in Pain? No/denies   Pain Location Knee   Pain Orientation Left            [facility] PT Assessment - 06/12/17 0001      Assessment   Medical Diagnosis chondromalacia patella    Referring Provider Dr. Benjamin Stainhekkekandam   Onset Date/Surgical Date 11/11/16   Hand Dominance Right   Next MD Visit 07/02/17   Prior Therapy none     Flexibility   Hamstrings RLE 85 deg, LLE 81 deg   Quadriceps Lt 142 deg      Palpation   Palpation comment tight Lt lateral quad with banding near patella         [facility] Adult PT Treatment/Exercise - 06/12/17 0001      Knee/Hip Exercises: Stretches   Passive Hamstring Stretch Right;Left;4 reps;30 seconds  supine with strap   Quad Stretch Left;4 reps;30 seconds (foam  noodle above knee), Rt leg 1 rep x 30 sec     Knee/Hip Exercises: Aerobic   Elliptical L3: 5 min      Knee/Hip Exercises: Standing   Stairs 20 steps, after manual therapy.      Knee/Hip Exercises: Prone   Prone Knee Hang Limitations High plank with scissor legs, (as per exercise in class); VC to not lock out knees x 5 reps      Modalities   Modalities Ultrasound     Ultrasound   Ultrasound Location Lt superior/lateral quad    Ultrasound Parameters 100%, 1.2 w/cm2, 8 min    Ultrasound Goals Pain;Other (Comment)  tightness     Manual Therapy   Manual therapy comments Sensitive skin Rock tape applied to lateral and medial portion of Lt knee (horseshoe shape, in each direction supporting knee), then J shaped piece at patellar tendon wrapping up to lateral quad to inhibit muscle.    Soft tissue mobilization deep tissue work through lateral quad into lateral patellar area.  Edge tool assistance to same area to decrease fascial restrictions.                      PT Long Term Goals - 06/12/17  1541      PT LONG TERM GOAL #1   Title Improve standing posture and alignment with patient to demonstrate knee extension without hyperextension 06/20/17   Time 6   Period Weeks   Status On-going  improved, no VC required.      PT LONG TERM GOAL #2   Title Improve patellar tracking with patient to report minimal to no pain with stairs; squatting; exercise 06/20/17   Time 6   Period Weeks   Status On-going  no pain in knee after manual and taping     PT LONG TERM GOAL #3   Title Progress to appropriate gym and home exercise program 06/20/17   Time 6   Period Weeks   Status On-going     PT LONG TERM GOAL #4   Title Independent in HEP 06/20/17   Time 6   Period Weeks   Status On-going     PT LONG TERM GOAL #5   Title Improve FOTO to </= 27% limitation 06/20/17   Time 6   Period Weeks   Status On-going               Plan - 06/12/17 1549    Clinical Impression  Statement Pt's quad flexibility has improved; hamstrings similar to last assessment.  She continues to have some tight banding in Lt distal lateral quad; may benefit from DN in future session.   Pt reported less crepitus and pain with stairs following manual therapy and taping.  Pt making gradual gains towards established goals.    Rehab Potential Good   PT Frequency 2x / week   PT Duration 6 weeks   PT Treatment/Interventions Patient/family education;ADLs/Self Care Home Management;Cryotherapy;Electrical Stimulation;Iontophoresis 4mg /ml Dexamethasone;Moist Heat;Ultrasound;Dry needling;Manual techniques;Therapeutic activities;Therapeutic exercise;Neuromuscular re-education;Taping   PT Next Visit Plan Possible DN.  Assess goals, FOTO; end of POC.    Consulted and Agree with Plan of Care Patient      Patient will benefit from skilled therapeutic intervention in order to improve the following deficits and impairments:  Postural dysfunction, Improper body mechanics, Pain, Increased fascial restricitons, Increased muscle spasms, Decreased mobility  Visit Diagnosis: Chronic pain of left knee  Other symptoms and signs involving the musculoskeletal system     Problem List Patient Active Problem List   Diagnosis Date Noted  . 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 Osteoarthritis of first metatarsophalangeal joint 04/28/2017  . Recurrent major depressive disorder, in partial remission (HCC) 04/22/2017  . Abnormal weight gain 04/22/2017  . Borderline hyperlipidemia 01/20/2017  . Gastroesophageal reflux disease 12/23/2016  . Moderate episode of recurrent major depressive disorder (HCC) 12/23/2016  . Perimenopausal 11/20/2016  . Chronic fatigue 11/20/2016  . Vitamin D insufficiency 08/03/2014  . Anxiety and depression 08/02/2014  . Thinning hair 08/02/2014  . Essential hypertension, benign 08/02/2014   Mayer CamelJennifer Carlson-Long, PTA 06/12/17 3:54 PM  [facility] [address] [place] [address] [place], [place], [address] Phone: [phone]   Fax:  [phone]  Name: Rachael MayersSheila Ramsay MRN: [id number] Date of Birth: 07/29/1966

## 2017-06-14 ENCOUNTER — Other Ambulatory Visit: Payer: Self-pay | Admitting: Physician Assistant

## 2017-06-14 DIAGNOSIS — F3341 Major depressive disorder, recurrent, in partial remission: Secondary | ICD-10-CM

## 2017-06-20 ENCOUNTER — Ambulatory Visit (INDEPENDENT_AMBULATORY_CARE_PROVIDER_SITE_OTHER): Payer: 59 | Admitting: Physical Therapy

## 2017-06-20 ENCOUNTER — Encounter: Payer: Self-pay | Admitting: Physical Therapy

## 2017-06-20 DIAGNOSIS — G8929 Other chronic pain: Secondary | ICD-10-CM

## 2017-06-20 DIAGNOSIS — M25561 Pain in right knee: Secondary | ICD-10-CM | POA: Diagnosis not present

## 2017-06-20 DIAGNOSIS — M25562 Pain in left knee: Secondary | ICD-10-CM

## 2017-06-20 DIAGNOSIS — R29898 Other symptoms and signs involving the musculoskeletal system: Secondary | ICD-10-CM | POA: Diagnosis not present

## 2017-06-20 NOTE — Therapy (Signed)
[facility] [place] [address] [place], [place], [address] Phone: [phone]   Fax:  [phone]  Physical Therapy Treatment  Patient Details  Name: Rachael [name] MRN: [id number] Date of Birth: 06/03/1966 Referring Provider: Dr Benjamin Stain  Encounter Date: 06/20/2017      PT End of Session - 06/20/17 1331    Visit Number 8   Number of Visits 16   Date for PT Re-Evaluation 07/18/17   PT Start Time 1331   PT Stop Time 1413   PT Time Calculation (min) 42 min   Activity Tolerance Patient tolerated treatment well      Past Medical History:  Diagnosis Date  . Anxiety   . Depression   . Hypertension     Past Surgical History:  Procedure Laterality Date  . CESAREAN SECTION  01/04/1999    There were no vitals filed for this visit.      Subjective Assessment - 06/20/17 1334    Subjective Pt reports its the Lt knee she has the most issue with however on Friday after doing her kettle bell workout the Rt knee started clicking and now the pain is alittle more on the Rt side.    Patient Stated Goals improve what she can to bew able to exercise with less or no pain    Currently in Pain? No/denies  Lt knee was a little sore this AM    Pain Relieving Factors mobic            [facility] PT Assessment - 06/20/17 0001      Assessment   Medical Diagnosis chondromalacia patella    Referring Provider Dr Benjamin Stain   Onset Date/Surgical Date 11/11/16   Hand Dominance Right   Next MD Visit 07/02/17   Prior Therapy none     Observation/Other Assessments   Focus on Therapeutic Outcomes (FOTO)  40% limited     Functional Tests   Functional tests Single Leg Squat     Single Leg Squat   Comments fair eccentric quad control Lt harder than Rt  - a lot accessory motion     ROM / Strength   AROM / PROM / Strength Strength;AROM     AROM   AROM Assessment Site --  LE WNL     Strength   Strength Assessment Site Hip   Right/Left Hip Right;Left   Right Hip Flexion 5/5   Right Hip Extension --  5-/5   Right Hip ABduction 4+/5   Left Hip Flexion 5/5   Left Hip Extension --  5-/5   Left Hip ABduction 4+/5     Flexibility   Quadriceps bilat 1" from buttocks                      [facility] Adult PT Treatment/Exercise - 06/20/17 0001      Self-Care   Self-Care Other Self-Care Comments   Other Self-Care Comments  self foam rolling for quads and ITB     Knee/Hip Exercises: Aerobic   Elliptical L3: 5 min      Knee/Hip Exercises: Standing   Other Standing Knee Exercises 10 reps curtesy lunges each side     Knee/Hip Exercises: Sidelying   Other Sidelying Knee/Hip Exercises 10 reps each and each side, FWD/BWD kicks, CW/CCW circles, hot potatoes.      Modalities   Modalities Ultrasound     Ultrasound   Ultrasound Location LT superior/lateral quad   Ultrasound Parameters 100%, 1.5 w/cm2, 1.93mHz  Ultrasound Goals Pain  tightness                 PT Education - 06/20/17 1356    Education provided Yes   Education Details HEP   Person(s) Educated Patient   Methods Explanation;Handout   Comprehension Returned demonstration             PT Long Term Goals - 06/20/17 1623      PT LONG TERM GOAL #1   Title Improve standing posture and alignment with patient to demonstrate knee extension without hyperextension 06/20/17   Status Achieved     PT LONG TERM GOAL #2   Title Improve patellar tracking with patient to report minimal to no pain with stairs; squatting; exercise 07/18/17   Time 4   Period Weeks   Status On-going     PT LONG TERM GOAL #3   Title Progress to appropriate gym and home exercise program 07/18/17   Time 4   Period Weeks   Status On-going     PT LONG TERM GOAL #4   Title Independent in HEP 07/18/17   Time 4   Period Weeks   Status On-going     PT LONG TERM GOAL #5   Title Improve FOTO to </= 27% limitation 07/18/17   Time 4   Period Weeks   Status  On-going  scored 40% limited, slightly worse than when starting PT                Plan - 06/20/17 1620    Clinical Impression Statement Anavey is making slow progress to her goals, flexibility has improved.  She has functional weakness in her hip abductors and in her quads with eccentric activities and fatigues quickly with exercise.  Was instructed in using foam roller more often as she is a little hesitant to have dry needling.  Her Rt knee flared up in pain this past week however Lt knee still bothers her more.  She would benefit from more therapy to progress with higher level strengthening and manual work/modalities for trigger point tightness.    Rehab Potential Good   PT Frequency 2x / week   PT Duration 4 weeks   PT Treatment/Interventions Patient/family education;ADLs/Self Care Home Management;Cryotherapy;Electrical Stimulation;Iontophoresis 4mg /ml Dexamethasone;Moist Heat;Ultrasound;Dry needling;Manual techniques;Therapeutic activities;Therapeutic exercise;Neuromuscular re-education;Taping   PT Next Visit Plan assess tolerance to new HEP and tightness in Lt distal lateral quad.     PT Home Exercise Plan L   Consulted and Agree with Plan of Care Patient      Patient will benefit from skilled therapeutic intervention in order to improve the following deficits and impairments:  Postural dysfunction, Improper body mechanics, Pain, Increased fascial restricitons, Increased muscle spasms, Decreased mobility  Visit Diagnosis: Chronic pain of left knee - Plan: PT plan of care cert/re-cert  Other symptoms and signs involving the musculoskeletal system - Plan: PT plan of care cert/re-cert  Chronic pain of right knee - Plan: PT plan of care cert/re-cert     Problem List Patient Active Problem List   Diagnosis Date Noted  . 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 Osteoarthritis of first metatarsophalangeal joint 04/28/2017  . Recurrent major depressive disorder, in  partial remission (HCC) 04/22/2017  . Abnormal weight gain 04/22/2017  . Borderline hyperlipidemia 01/20/2017  . Gastroesophageal reflux disease 12/23/2016  . Moderate episode of recurrent major depressive disorder (HCC) 12/23/2016  . Perimenopausal 11/20/2016  . Chronic fatigue 11/20/2016  . Vitamin D insufficiency 08/03/2014  . Anxiety  and depression 08/02/2014  . Thinning hair 08/02/2014  . Essential hypertension, benign 08/02/2014    Roderic ScarceSusan Shaver PT 06/20/2017, 4:26 PM  [facility] [address] [place], [place], [address] Phone: [phone]   Fax:  [phone]  Name: Torrie MayersSheila Vo MRN: [id number] Date of Birth: 01/31/1966

## 2017-06-20 NOTE — Patient Instructions (Addendum)
Side Kick    Lie on side, back straight along edge of mat, legs 30 in front of torso. Lift top leg to hip height, foot flexed. Exhale, kicking forward twice. Inhale, kicking backward twice with pointed foot. Keep leg hip height, torso still. Repeat __10-20__ times. Repeat on other side. Do _off days from classes___ sessions per day.  Side Leg Circle    Lie on side, back straight along edge of mat, legs 30 in front of torso. Lift top leg to hip height. Rotate in small circle, __10-20__ times in each direction. Repeat __1__ times. Repeat on other side. Do _off days from classes_ sessions per day.   Over / Back: "Hot Potato"    Lie on side, back straight along edge of mat, legs 30 in front of torso. Touch toes of top foot in front of bottom leg, then quickly touch in back. Repeat _10-20___ times. Repeat on other side. Do _off days from classes___ sessions per day.  can pulse in the lunge position to make it harder

## 2017-06-26 ENCOUNTER — Ambulatory Visit (INDEPENDENT_AMBULATORY_CARE_PROVIDER_SITE_OTHER): Payer: 59 | Admitting: Rehabilitative and Restorative Service Providers"

## 2017-06-26 ENCOUNTER — Encounter: Payer: Self-pay | Admitting: Rehabilitative and Restorative Service Providers"

## 2017-06-26 DIAGNOSIS — M25561 Pain in right knee: Secondary | ICD-10-CM

## 2017-06-26 DIAGNOSIS — G8929 Other chronic pain: Secondary | ICD-10-CM | POA: Diagnosis not present

## 2017-06-26 DIAGNOSIS — R29898 Other symptoms and signs involving the musculoskeletal system: Secondary | ICD-10-CM

## 2017-06-26 DIAGNOSIS — M25562 Pain in left knee: Secondary | ICD-10-CM | POA: Diagnosis not present

## 2017-06-26 NOTE — Therapy (Signed)
[facility] [place] [address] [place], [place], [address] Phone: [phone]   Fax:  [phone]  Physical Therapy Treatment  Patient Details  Name: Rachael MayersSheila [name] MRN: [id number] Date of Birth: 06/22/1966 Referring Provider: Dr Benjamin Stainhekkekandam  Encounter Date: 06/26/2017      [name] End of Session - 06/26/17 1533    Visit Number 9   Number of Visits 16   Date for [name] Re-Evaluation 07/18/17   [name] Start Time 1415   [name] Stop Time 1514   [name] Time Calculation (min) 59 min   Activity Tolerance Patient tolerated treatment well      Past Medical History:  Diagnosis Date  . Anxiety   . Depression   . Hypertension     Past Surgical History:  Procedure Laterality Date  . CESAREAN SECTION  01/04/1999    There were no vitals filed for this visit.      Subjective Assessment - 06/26/17 1615    Subjective [name] reports its the Lt knee continues to be the more painful knee but the Rt knee is still clicking. Ready to try the dry needling.    Currently in Pain? Yes   Pain Score 4    Pain Location Knee   Pain Orientation Right   Pain Descriptors / Indicators Dull;Nagging   Pain Type Chronic pain                         [facility] Adult [name] Treatment/Exercise - 06/26/17 0001      Knee/Hip Exercises: Stretches   Passive Hamstring Stretch Right;Left;4 reps;30 seconds   Quad Stretch Left;4 reps;30 seconds     Knee/Hip Exercises: Aerobic   Elliptical L3: 5 min      Knee/Hip Exercises: Standing   Other Standing Knee Exercises 10 reps curtesy lunges each side     Knee/Hip Exercises: Sidelying   Other Sidelying Knee/Hip Exercises 10 reps each and each side, FWD/BWD kicks, CW/CCW circles, hot potatoes.      Moist Heat Therapy   Number Minutes Moist Heat 15 Minutes   Moist Heat Location Knee  bilat ant thigh/knee     Ultrasound   Ultrasound Location Lr superior/lateral quad proximal to the patella    Ultrasound Parameters  100%; 1.2 w/cm2; 1 mHz; 8 min    Ultrasound Goals Pain  tightness      Manual Therapy   Soft tissue mobilization deep tissue work through lateral quad into lateral patellar area.  Edge tool assistance to same area to decrease fascial restrictions.           Trigger Point Dry Needling - 06/26/17 1739    Consent Given? Yes   Education Handout Provided Yes   Quadriceps Response --  Lt lateral quad w/estim              [name] Education - 06/26/17 1615    Education provided Yes   Education Details DN   Person(s) Educated Patient   Methods Explanation   Comprehension Verbalized understanding             [name] Long Term Goals - 06/20/17 1623      [name] LONG TERM GOAL #1   Title Improve standing posture and alignment with patient to demonstrate knee extension without hyperextension 06/20/17   Status Achieved     [name] LONG TERM GOAL #2   Title Improve patellar tracking with patient to report minimal to no pain with stairs; squatting; exercise 07/18/17  Time 4   Period Weeks   Status On-going     [name] LONG TERM GOAL #3   Title Progress to appropriate gym and home exercise program 07/18/17   Time 4   Period Weeks   Status On-going     [name] LONG TERM GOAL #4   Title Independent in HEP 07/18/17   Time 4   Period Weeks   Status On-going     [name] LONG TERM GOAL #5   Title Improve FOTO to </= 27% limitation 07/18/17   Time 4   Period Weeks   Status On-going  scored 40% limited, slightly worse than when starting [name]                Plan - 06/26/17 1612    Clinical Impression Statement Continued Lt > Rt knee pain. Patient has papable tightness through the Lt lateral quad and ITB. Pressley tolerated DN well today with good improvement in tissue extensibility.    Rehab Potential Good   [name] Frequency 2x / week   [name] Duration 4 weeks   [name] Treatment/Interventions Patient/family education;ADLs/Self Care Home Management;Cryotherapy;Electrical Stimulation;Iontophoresis 4mg /ml  Dexamethasone;Moist Heat;Ultrasound;Dry needling;Manual techniques;Therapeutic activities;Therapeutic exercise;Neuromuscular re-education;Taping   [name] Next Visit Plan assess response to DN/manual work; progress HEPas indicated; continue to work to improve tightness in Lt distal lateral quad.     Consulted and Agree with Plan of Care Patient      Patient will benefit from skilled therapeutic intervention in order to improve the following deficits and impairments:  Postural dysfunction, Improper body mechanics, Pain, Increased fascial restricitons, Increased muscle spasms, Decreased mobility  Visit Diagnosis: Chronic pain of left knee  Other symptoms and signs involving the musculoskeletal system  Chronic pain of right knee     Problem List Patient Active Problem List   Diagnosis Date Noted  . 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 Osteoarthritis of first metatarsophalangeal joint 04/28/2017  . Recurrent major depressive disorder, in partial remission (HCC) 04/22/2017  . Abnormal weight gain 04/22/2017  . Borderline hyperlipidemia 01/20/2017  . Gastroesophageal reflux disease 12/23/2016  . Moderate episode of recurrent major depressive disorder (HCC) 12/23/2016  . Perimenopausal 11/20/2016  . Chronic fatigue 11/20/2016  . Vitamin D insufficiency 08/03/2014  . Anxiety and depression 08/02/2014  . Thinning hair 08/02/2014  . Essential hypertension, benign 08/02/2014    Rachael [name], Rachael [name]  06/26/2017, 5:42 PM  [facility] [address] [place], [place], [address] Phone: [phone]   Fax:  [phone]  Name: Rachael [name] MRN: [id number] Date of Birth: [birth year]

## 2017-06-26 NOTE — Patient Instructions (Signed)

## 2017-07-02 ENCOUNTER — Encounter: Payer: Self-pay | Admitting: Sports Medicine

## 2017-07-02 ENCOUNTER — Ambulatory Visit (INDEPENDENT_AMBULATORY_CARE_PROVIDER_SITE_OTHER): Payer: 59 | Admitting: Sports Medicine

## 2017-07-02 DIAGNOSIS — R635 Abnormal weight gain: Secondary | ICD-10-CM

## 2017-07-02 DIAGNOSIS — M2240 Chondromalacia patellae, unspecified knee: Secondary | ICD-10-CM

## 2017-07-02 MED ORDER — PHENTERMINE HCL 37.5 MG PO TABS: ORAL_TABLET | ORAL | 0 refills | Status: DC

## 2017-07-02 NOTE — Progress Notes (Signed)
  Subjective:    CC: Follow-up  HPI: Knee pain: Doing much better  Abnormal weight gain: 8 pound weight loss.  Past medical history:  Negative.  See flowsheet/record as well for more information.  Surgical history: Negative.  See flowsheet/record as well for more information.  Family history: Negative.  See flowsheet/record as well for more information.  Social history: Negative.  See flowsheet/record as well for more information.  Allergies, and medications have been entered into the medical record, reviewed, and no changes needed.   Review of Systems: No fevers, chills, night sweats, weight loss, chest pain, or shortness of breath.   Objective:    General: Well Developed, well nourished, and in no acute distress.  Neuro: Alert and oriented x3, extra-ocular muscles intact, sensation grossly intact.  HEENT: Normocephalic, atraumatic, pupils equal round reactive to light, neck supple, no masses, no lymphadenopathy, thyroid nonpalpable.  Skin: Warm and dry, no rashes. Cardiac: Regular rate and rhythm, no murmurs rubs or gallops, no lower extremity edema.  Respiratory: Clear to auscultation bilaterally. Not using accessory muscles, speaking in full sentences. Hips: Abductors strength has improved significantly.  Impression and Recommendations:    Abnormal weight gain 8 pound weight loss after the first month. Refilling, return in one month.  Chondromalacia of patellofemoral joint Doing very well, hip abductor strength is improving in the gym. Pain is improving.

## 2017-07-02 NOTE — Assessment & Plan Note (Signed)
Doing very well, hip abductor strength is improving in the gym. Pain is improving.

## 2017-07-02 NOTE — Assessment & Plan Note (Signed)
8 pound weight loss after the first month. Refilling, return in one month.

## 2017-07-03 ENCOUNTER — Ambulatory Visit (INDEPENDENT_AMBULATORY_CARE_PROVIDER_SITE_OTHER): Payer: 59 | Admitting: Physical Therapy

## 2017-07-03 ENCOUNTER — Encounter: Payer: Self-pay | Admitting: Physical Therapy

## 2017-07-03 DIAGNOSIS — R29898 Other symptoms and signs involving the musculoskeletal system: Secondary | ICD-10-CM

## 2017-07-03 DIAGNOSIS — M25561 Pain in right knee: Secondary | ICD-10-CM | POA: Diagnosis not present

## 2017-07-03 DIAGNOSIS — G8929 Other chronic pain: Secondary | ICD-10-CM | POA: Diagnosis not present

## 2017-07-03 DIAGNOSIS — M25562 Pain in left knee: Secondary | ICD-10-CM | POA: Diagnosis not present

## 2017-07-03 NOTE — Therapy (Signed)
[place] [address], [place], [address] Phone: [phone]   Fax:  [phone]  Physical Therapy Treatment  Patient Details  Name: Rachael [name] MRN: [id number] Date of Birth: 1966-09-28 Referring Provider: Dr Dianah Field  Encounter Date: 07/03/2017      PT End of Session - 07/03/17 1450    Visit Number 10   Number of Visits 16   Date for PT Re-Evaluation 07/18/17   PT Start Time 1450   PT Stop Time 1539  heat at end   PT Time Calculation (min) 49 min   Activity Tolerance Patient tolerated treatment well      Past Medical History:  Diagnosis Date  . Anxiety   . Depression   . Hypertension     Past Surgical History:  Procedure Laterality Date  . CESAREAN SECTION  01/04/1999    There were no vitals filed for this visit.      Subjective Assessment - 07/03/17 1451    Subjective Pt reports that the DN helped alot, she was shocked how much better the quad felt on stairs the next day. She felt that her knee did better with todays harder exercise class. Saw Dr T and he saw improvement in her strength.    Patient Stated Goals improve what she can to bew able to exercise with less or no pain    Currently in Pain? Yes   Pain Score 3    Pain Location Knee   Pain Orientation Right   Pain Descriptors / Indicators Dull   Pain Type Chronic pain   Pain Onset More than a month ago   Pain Frequency Intermittent   Aggravating Factors  stairs   Pain Relieving Factors meds                         [facility] Adult PT Treatment/Exercise - 07/03/17 0001      Knee/Hip Exercises: Stretches   Sports administrator Both;2 reps;30 seconds  prone with strap     Knee/Hip Exercises: Aerobic   Elliptical L3: 5 min      Knee/Hip Exercises: Machines for Strengthening   Other Machine quadrapud bear with side stepping 2x5 reps     Knee/Hip Exercises: Standing   Other Standing Knee Exercises 10 reps curtesy  lunges each side with step onto 8" step and small plyo jump   Other Standing Knee Exercises 10 reps single leg sit to/from stand each side.      Modalities   Modalities Moist Heat     Moist Heat Therapy   Number Minutes Moist Heat 15 Minutes   Moist Heat Location Knee  bilat and quads          Trigger Point Dry Needling - 07/03/17 1504    Consent Given? Yes   Education Handout Provided No   Quadriceps Response Palpable increased muscle length;Twitch response elicited  lateral and rectus femoris Lt side                   PT Long Term Goals - 07/03/17 1454      PT LONG TERM GOAL #1   Title Improve standing posture and alignment with patient to demonstrate knee extension without hyperextension 06/20/17   Status Achieved     PT LONG TERM GOAL #2   Title Improve patellar tracking with patient to report minimal to no pain with stairs; squatting; exercise 07/18/17   Status Partially Met  usually good with  squats unless she is very tired and her form starts to go.      PT LONG TERM GOAL #3   Title Progress to appropriate gym and home exercise program 07/18/17   Status Achieved     PT LONG TERM GOAL #4   Title Independent in HEP 07/18/17   Status On-going     PT LONG TERM GOAL #5   Title Improve FOTO to </= 27% limitation 07/18/17   Status On-going               Plan - 07/03/17 1522    Clinical Impression Statement Rachael [name] had a great response to DN at last visit, had less pain on stairs and feelings of better quad/knee control.  Did well today with higher level strengthening and manual work.    Rehab Potential Good   PT Frequency 2x / week   PT Duration 4 weeks   PT Treatment/Interventions Patient/family education;ADLs/Self Care Home Management;Cryotherapy;Electrical Stimulation;Iontophoresis 64m/ml Dexamethasone;Moist Heat;Ultrasound;Dry needling;Manual techniques;Therapeutic activities;Therapeutic exercise;Neuromuscular re-education;Taping   PT Next Visit Plan  cont higher level eccentric quad work and manual work if needed.   Consulted and Agree with Plan of Care Patient      Patient will benefit from skilled therapeutic intervention in order to improve the following deficits and impairments:  Postural dysfunction, Improper body mechanics, Pain, Increased fascial restricitons, Increased muscle spasms, Decreased mobility  Visit Diagnosis: Chronic pain of left knee  Other symptoms and signs involving the musculoskeletal system  Chronic pain of right knee     Problem List Patient Active Problem List   Diagnosis Date Noted  . 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 Osteoarthritis of first metatarsophalangeal joint 04/28/2017  . Recurrent major depressive disorder, in partial remission ([place] 04/22/2017  . Abnormal weight gain 04/22/2017  . Borderline hyperlipidemia 01/20/2017  . Gastroesophageal reflux disease 12/23/2016  . Moderate episode of recurrent major depressive disorder ([place] 12/23/2016  . Perimenopausal 11/20/2016  . Chronic fatigue 11/20/2016  . Vitamin D insufficiency 08/03/2014  . Anxiety and depression 08/02/2014  . Thinning hair 08/02/2014  . Essential hypertension, benign 08/02/2014    SJeral PinchPT  07/03/2017, 3:27 PM  [facility] [address] [place] [address]Phone: [phone]  Fax:  [phone] Name: Rachael VallelyMRN: [id number] of Birth: [birth year]

## 2017-07-16 ENCOUNTER — Ambulatory Visit (INDEPENDENT_AMBULATORY_CARE_PROVIDER_SITE_OTHER): Payer: 59 | Admitting: Physical Therapy

## 2017-07-16 ENCOUNTER — Encounter: Payer: Self-pay | Admitting: Physical Therapy

## 2017-07-16 DIAGNOSIS — M25562 Pain in left knee: Secondary | ICD-10-CM

## 2017-07-16 DIAGNOSIS — G8929 Other chronic pain: Secondary | ICD-10-CM | POA: Diagnosis not present

## 2017-07-16 DIAGNOSIS — M25561 Pain in right knee: Secondary | ICD-10-CM | POA: Diagnosis not present

## 2017-07-16 DIAGNOSIS — R29898 Other symptoms and signs involving the musculoskeletal system: Secondary | ICD-10-CM | POA: Diagnosis not present

## 2017-07-16 NOTE — Therapy (Signed)
[facility] [address] [place], [place], [address] Phone: [phone]   Fax:  [phone]  Physical Therapy Treatment  Patient Details  Name: Rachael [name] MRN: [id number] Date of Birth: 09/18/1966 Referring Provider: Dr Thekkekandam  Encounter Date: 07/16/2017      PT End of Session - 07/16/17 1100    Visit Number 11   Number of Visits 16   Date for PT Re-Evaluation 07/18/17   PT Start Time 1100   PT Stop Time 1132   PT Time Calculation (min) 32 min   Activity Tolerance Patient tolerated treatment well      Past Medical History:  Diagnosis Date  . Anxiety   . Depression   . Hypertension     Past Surgical History:  Procedure Laterality Date  . CESAREAN SECTION  01/04/1999    There were no vitals filed for this visit.      Subjective Assessment - 07/16/17 1101    Subjective Pt reports she is doing really well. Has had minor pain with exercise and when it gets weak   Patient Stated Goals improve what she can to bew able to exercise with less or no pain    Currently in Pain? No/denies            [facility] PT Assessment - 07/16/17 0001      Assessment   Medical Diagnosis chondromalacia patella    Referring Provider Dr Thekkekandam   Onset Date/Surgical Date 11/11/16   Hand Dominance Right     Observation/Other Assessments   Focus on Therapeutic Outcomes (FOTO)  16% limited     Single Leg Squat   Comments good bilat     Strength   Right Hip Flexion 5/5   Right Hip Extension 5/5   Right Hip ABduction 5/5   Left Hip Flexion 5/5   Left Hip Extension 5/5   Left Hip ABduction 5/5     Flexibility   Quadriceps bilat WNL                     [facility] Adult PT Treatment/Exercise - 07/16/17 0001      Knee/Hip Exercises: Stretches   Hip Flexor Stretch Both;2 reps  45 sec    Other Knee/Hip Stretches cross body stretch with strap, 2x45 sec each     Knee/Hip Exercises: Aerobic   Elliptical L3: 5 min      Knee/Hip Exercises: Standing   Forward Lunges Both;2 sets;10 reps  half kneel to stand, with hip drifve     Knee/Hip Exercises: Seated   Sit to Sand 2 sets;10 reps;without UE support  single leg each side     Knee/Hip Exercises: Sidelying   Other Sidelying Knee/Hip Exercises 2x10 side planks with hip taps                PT Education - 07/16/17 1129    Education provided Yes   Education Details HEP progression   Person(s) Educated Patient   Methods Explanation;Demonstration;Handout   Comprehension Returned demonstration;Verbalized understanding             PT Long Term Goals - 07/16/17 1106      PT LONG TERM GOAL #1   Title Improve standing posture and alignment with patient to demonstrate knee extension without hyperextension 06/20/17   Status Achieved     PT LONG TERM GOAL #2   Title Improve patellar tracking with patient to report minimal to no pain with stairs; squatting; exercise 07/18/17     Status Achieved     PT LONG TERM GOAL #3   Title Progress to appropriate gym and home exercise program 07/18/17   Status Achieved     PT LONG TERM GOAL #4   Title Independent in HEP 07/18/17   Status Achieved     PT LONG TERM GOAL #5   Title Improve FOTO to </= 27% limitation 07/18/17   Status Achieved  16% limited               Plan - 07/16/17 1129    Clinical Impression Statement Khristian has done great, all goals met.  She has exceeded her own expectations and has returned to all activity she wanted.  Ready for discharge.    PT Next Visit Plan discharge to HEP    Consulted and Agree with Plan of Care Patient      Patient will benefit from skilled therapeutic intervention in order to improve the following deficits and impairments:     Visit Diagnosis: Chronic pain of left knee  Other symptoms and signs involving the musculoskeletal system  Chronic pain of right knee     Problem List Patient Active Problem List    Diagnosis Date Noted  . 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 Osteoarthritis of first metatarsophalangeal joint 04/28/2017  . Recurrent major depressive disorder, in partial remission ([place]) 04/22/2017  . Abnormal weight gain 04/22/2017  . Borderline hyperlipidemia 01/20/2017  . Gastroesophageal reflux disease 12/23/2016  . Moderate episode of recurrent major depressive disorder ([place]) 12/23/2016  . Perimenopausal 11/20/2016  . Chronic fatigue 11/20/2016  . Vitamin D insufficiency 08/03/2014  . Anxiety and depression 08/02/2014  . Thinning hair 08/02/2014  . Essential hypertension, benign 08/02/2014    [facility] 07/16/2017, 11:31 AM  [facility] [address] [place], [place], [address] Phone: [phone]   Fax:  [phone]  Name: Rachael [name] MRN: [id number] Date of Birth: [birth year]   PHYSICAL THERAPY DISCHARGE SUMMARY  Visits from Start of Care: 11  Current functional level related to goals / functional outcomes: See above   Remaining deficits: none   Education / Equipment: HEP Plan: Patient agrees to discharge.  Patient goals were met. Patient is being discharged due to meeting the stated rehab goals.  ?????    Jeral Pinch, PT 07/16/17 11:31 AM

## 2017-07-23 ENCOUNTER — Encounter: Payer: Self-pay | Admitting: Physician Assistant

## 2017-07-23 ENCOUNTER — Ambulatory Visit (INDEPENDENT_AMBULATORY_CARE_PROVIDER_SITE_OTHER): Payer: 59 | Admitting: Physician Assistant

## 2017-07-23 VITALS — BP 125/78 | HR 77 | Temp 97.5°F | Ht 63.5 in | Wt 141.0 lb

## 2017-07-23 DIAGNOSIS — F341 Dysthymic disorder: Secondary | ICD-10-CM | POA: Insufficient documentation

## 2017-07-23 DIAGNOSIS — F329 Major depressive disorder, single episode, unspecified: Secondary | ICD-10-CM

## 2017-07-23 DIAGNOSIS — F419 Anxiety disorder, unspecified: Secondary | ICD-10-CM

## 2017-07-23 DIAGNOSIS — Z23 Encounter for immunization: Secondary | ICD-10-CM | POA: Diagnosis not present

## 2017-07-23 NOTE — Progress Notes (Signed)
HPI:                                                                Torrie MayersSheila Fielding is a 51 y.o. female who [facility] today for anxiety and depression follow-up  Depression/Anxiety: taking Cymbalta and Buspar twice a day without difficulty. Has weaned herself off of xanax. She feels like anxiety is well-controlled, but mood is still depressed. Denies symptoms of mania/hypomania. Denies suicidal thinking. Denies auditory/visual hallucinations. Previous meds: Effexor, Viibryd, Abilify, Wellbutrin, SSRI's  Past Medical History:  Diagnosis Date  . Anxiety   . Depression   . Hypertension    Past Surgical History:  Procedure Laterality Date  . CESAREAN SECTION  01/04/1999   Social History  Substance Use Topics  . Smoking status: Never Smoker  . Smokeless tobacco: Never Used  . Alcohol use No   family history includes Asthma in her mother; Breast cancer in her unknown relative; COPD in her mother; Diabetes in her unknown relative; Hyperlipidemia in her father; Hypertension in her father; Stroke in her unknown relative.  ROS: negative except as noted in the HPI  Medications: Current Outpatient Prescriptions  Medication Sig Dispense Refill  . ALPRAZolam (XANAX) 0.5 MG tablet Take 1 tablet (0.5 mg total) by mouth as needed for anxiety. 30 tablet 1  . Biotin (BIOTIN 5000) 5 MG CAPS Take by mouth.    Marland Kitchen. BLISOVI 24 FE 1-20 MG-MCG(24) tablet     . busPIRone (BUSPAR) 10 MG tablet Take 1 tablet (10 mg total) by mouth 3 (three) times daily. 90 tablet 2  . DULoxetine (CYMBALTA) 20 MG capsule TAKE 1 CAPSULE (20 MG TOTAL) BY MOUTH 2 (TWO) TIMES DAILY. 60 capsule 2  . meloxicam (MOBIC) 15 MG tablet One tab PO qAM with breakfast for 2 weeks, then daily prn pain. 30 tablet 3  . phentermine (ADIPEX-P) 37.5 MG tablet One tab by mouth qAM 30 tablet 0  . Probiotic Product (PROBIOTIC-10 PO) Take by mouth.    . ranitidine (ZANTAC) 300 MG tablet  Take 0.5 tablets (150 mg total) by mouth 2 (two) times daily. 180 tablet 3  . Vitamin D, Ergocalciferol, (DRISDOL) 50000 units CAPS capsule TAKE 1 CAPSULE (50,000 UNITS TOTAL) BY MOUTH EVERY 7 (SEVEN) DAYS. 8 capsule 0   No current facility-administered medications for this visit.    Allergies  Allergen Reactions  . Abilify [Aripiprazole]     Panic attacks when coupled with cymbalta       Objective:  BP 125/78   Pulse 77   Temp (!) 97.5 F (36.4 C)   Ht 5' 3.5" (1.613 m)   Wt 141 lb (64 kg)   SpO2 100%   BMI 24.59 kg/m  Gen:  alert, not ill-appearing, no distress, appropriate for age HEENT: head normocephalic without obvious abnormality, conjunctiva and cornea clear, trachea midline Pulm: Normal work of breathing, normal phonation Neuro: alert and oriented x 3, no tremor MSK: extremities atraumatic, normal gait and station Skin: intact, no rashes on exposed skin, no jaundice, no cyanosis Psych: well-groomed, cooperative, good eye contact, depressed mood, affect mood-congruent, speech is articulate, and thought processes clear and goal-directed  No results found for this or any previous visit (from the past 72 hour(s)).  No results found.  Depression screen [facility] 2/9 07/23/2017 04/22/2017 01/20/2017 12/23/2016 11/20/2016  Decreased Interest 2 0 Down, Depressed, Hopeless PHQ - 2 Score Altered sleeping 2 0 - 1 1  Tired, decreased energy 1 2 - 2 3  Change in appetite 0 0 - 1 0  Feeling bad or failure about yourself  2 3 - 3 3  Trouble concentrating 1 2 - 1 2  Moving slowly or fidgety/restless 0 0 - 1 0  Suicidal thoughts 0 0 - 0 0  PHQ-9 Score 10 9 - 13 11  Difficult doing work/chores Somewhat difficult - - - -   GAD 7 : Generalized Anxiety Score 04/22/2017 12/23/2016 11/20/2016  Nervous, Anxious, on Edge 0 2 3  Control/stop worrying 0 2 2  Worry too much - different things Trouble relaxing 0 2 2  Restless 0 0 1  Easily annoyed or irritable  Afraid - awful might happen 0 2 1  Total GAD 7 Score Assessment and Plan: 51 y.o. female with   1. Anxiety and depression, Dysthmyia - PHQ9 score 10, worsened from 9, 3 months ago - discussed treatment options to include augmenting Cymbalta, switching to Trintellix, or referral to Psychiatry. Patient opted for a referral - no acute safety issues - cont current meds - Ambulatory referral to Psychiatry  2. Need for immunization against influenza - Flu Vaccine QUAD 36+ mos IM  Patient education and anticipatory guidance given Patient agrees with treatment plan Follow-up as needed if symptoms worsen or fail to improve  Levonne Hubert PA-C

## 2017-07-30 ENCOUNTER — Ambulatory Visit (INDEPENDENT_AMBULATORY_CARE_PROVIDER_SITE_OTHER): Payer: 59 | Admitting: Sports Medicine

## 2017-07-30 ENCOUNTER — Encounter: Payer: Self-pay | Admitting: Sports Medicine

## 2017-07-30 DIAGNOSIS — R635 Abnormal weight gain: Secondary | ICD-10-CM | POA: Diagnosis not present

## 2017-07-30 MED ORDER — PHENTERMINE HCL 37.5 MG PO TABS: ORAL_TABLET | ORAL | 0 refills | Status: DC

## 2017-07-30 MED ORDER — POLYETHYLENE GLYCOL 3350 17 G PO PACK: 17.0000 g | PACK | Freq: Two times a day (BID) | ORAL | 11 refills | Status: DC

## 2017-07-30 NOTE — Progress Notes (Signed)
  Subjective:    CC:  Weight check  HPI: This is a pleasant 51 year old female, we be treating with phentermine for 2 months now, she continues to lose weight, eating closer to her goal. She does feel better, is having a touch of constipation. No complaints, no other adverse effects.  Past medical history:  Negative.  See flowsheet/record as well for more information.  Surgical history: Negative.  See flowsheet/record as well for more information.  Family history: Negative.  See flowsheet/record as well for more information.  Social history: Negative.  See flowsheet/record as well for more information.  Allergies, and medications have been entered into the medical record, reviewed, and no changes needed.   Review of Systems: No fevers, chills, night sweats, weight loss, chest pain, or shortness of breath.   Objective:    General: Well Developed, well nourished, and in no acute distress.  Neuro: Alert and oriented x3, extra-ocular muscles intact, sensation grossly intact.  HEENT: Normocephalic, atraumatic, pupils equal round reactive to light, neck supple, no masses, no lymphadenopathy, thyroid nonpalpable.  Skin: Warm and dry, no rashes. Cardiac: Regular rate and rhythm, no murmurs rubs or gallops, no lower extremity edema.  Respiratory: Clear to auscultation bilaterally. Not using accessory muscles, speaking in full sentences.  Impression and Recommendations:    Abnormal weight gain Additional 6-7 pound weight loss after the second month of Phenergan. She is now in the normal BMI. Having a little bit of constipation, adding MiraLAX, she will also triple her fiber intake.  Return in one month. Target is 125 lbs, we will overshoot her goal weight because she will probably gain a little bit backward we discontinue the medication.  I spent 25 minutes with this patient, greater than 50% was face-to-face time counseling regarding the above  diagnoses ___________________________________________ Ihor Austin. Benjamin Stain, M.D., ABFM., CAQSM. Primary Care and Sports Medicine [facility]  Adjunct Instructor of Family Medicine  [employer]

## 2017-07-30 NOTE — Assessment & Plan Note (Signed)
Additional 6-7 pound weight loss after the second month of Phenergan. She is now in the normal BMI. Having a little bit of constipation, adding MiraLAX, she will also triple her fiber intake.  Return in one month. Target is 125 lbs, we will overshoot her goal weight because she will probably gain a little bit backward we discontinue the medication.

## 2017-08-05 ENCOUNTER — Other Ambulatory Visit: Payer: Self-pay | Admitting: Physician Assistant

## 2017-08-05 DIAGNOSIS — F3341 Major depressive disorder, recurrent, in partial remission: Secondary | ICD-10-CM

## 2017-08-08 ENCOUNTER — Other Ambulatory Visit: Payer: Self-pay | Admitting: Obstetrics & Gynecology

## 2017-08-08 DIAGNOSIS — N951 Menopausal and female climacteric states: Secondary | ICD-10-CM

## 2017-08-11 ENCOUNTER — Other Ambulatory Visit: Payer: Self-pay

## 2017-08-11 DIAGNOSIS — F3341 Major depressive disorder, recurrent, in partial remission: Secondary | ICD-10-CM

## 2017-08-11 MED ORDER — BUSPIRONE HCL 10 MG PO TABS: 10.0000 mg | ORAL_TABLET | Freq: Three times a day (TID) | ORAL | 2 refills | Status: DC

## 2017-08-15 ENCOUNTER — Other Ambulatory Visit: Payer: Self-pay | Admitting: Obstetrics & Gynecology

## 2017-08-15 DIAGNOSIS — R7989 Other specified abnormal findings of blood chemistry: Secondary | ICD-10-CM

## 2017-08-20 ENCOUNTER — Encounter: Payer: Self-pay | Admitting: Physician Assistant

## 2017-08-20 ENCOUNTER — Ambulatory Visit (INDEPENDENT_AMBULATORY_CARE_PROVIDER_SITE_OTHER): Payer: 59 | Admitting: Physician Assistant

## 2017-08-20 ENCOUNTER — Other Ambulatory Visit: Payer: Self-pay | Admitting: *Deleted

## 2017-08-20 VITALS — BP 145/86 | HR 74 | Wt 134.0 lb

## 2017-08-20 DIAGNOSIS — I10 Essential (primary) hypertension: Secondary | ICD-10-CM | POA: Diagnosis not present

## 2017-08-20 DIAGNOSIS — F329 Major depressive disorder, single episode, unspecified: Secondary | ICD-10-CM

## 2017-08-20 DIAGNOSIS — Z79899 Other long term (current) drug therapy: Secondary | ICD-10-CM | POA: Diagnosis not present

## 2017-08-20 DIAGNOSIS — R635 Abnormal weight gain: Secondary | ICD-10-CM | POA: Diagnosis not present

## 2017-08-20 DIAGNOSIS — F419 Anxiety disorder, unspecified: Secondary | ICD-10-CM

## 2017-08-20 DIAGNOSIS — N951 Menopausal and female climacteric states: Secondary | ICD-10-CM

## 2017-08-20 MED ORDER — PHENTERMINE HCL 15 MG PO CAPS: 15.0000 mg | ORAL_CAPSULE | ORAL | 0 refills | Status: DC

## 2017-08-20 MED ORDER — ALPRAZOLAM 0.5 MG PO TABS: 0.5000 mg | ORAL_TABLET | ORAL | 2 refills | Status: DC | PRN

## 2017-08-20 MED ORDER — NORETHIN ACE-ETH ESTRAD-FE 1-20 MG-MCG(24) PO TABS: 1.0000 | ORAL_TABLET | Freq: Every day | ORAL | 2 refills | Status: DC

## 2017-08-20 NOTE — Progress Notes (Signed)
HPI:                                                                Rachael [name] is a 51 y.o. female who presents to [name]: Primary [facility] Medicine today for weight check  She was started on Phentermine by Dr. Benjamin Stain in July. She is tolerating medication well; denies headache, palpitations, or insomnia. She is exercising 4-5 days per week.   HTN: managing with diet and exercise. Does not check BP's at home. Denies vision change, headache, chest pain with exertion, orthopnea, lightheadedness, syncope and edema. No additional CV risk factors.  Depression/Anxiety: has an appointment with Psychiatry at the end of the month. Taking Cymbalta, Buspar and Xanax prn. Denies symptoms of mania/hypomania. Denies suicidal thinking. Denies auditory/visual hallucinations.  Past Medical History:  Diagnosis Date  . Anxiety   . Depression   . Hypertension    Past Surgical History:  Procedure Laterality Date  . CESAREAN SECTION  01/04/1999   Social History  Substance Use Topics  . Smoking status: Never Smoker  . Smokeless tobacco: Never Used  . Alcohol use No   family history includes Asthma in her mother; Breast cancer in her unknown relative; COPD in her mother; Diabetes in her unknown relative; Hyperlipidemia in her father; Hypertension in her father; Stroke in her unknown relative.  ROS: negative except as noted in the HPI  Medications: Current Outpatient Prescriptions  Medication Sig Dispense Refill  . ALPRAZolam (XANAX) 0.5 MG tablet Take 1 tablet (0.5 mg total) by mouth as needed for anxiety. 30 tablet 1  . Biotin (BIOTIN 5000) 5 MG CAPS Take by mouth.    . busPIRone (BUSPAR) 10 MG tablet Take 1 tablet (10 mg total) by mouth 3 (three) times daily. 90 tablet 2  . DULoxetine (CYMBALTA) 20 MG capsule TAKE 1 CAPSULE (20 MG TOTAL) BY MOUTH 2 (TWO) TIMES DAILY. 60 capsule 2  . meloxicam (MOBIC) 15 MG tablet One tab PO qAM with breakfast for 2 weeks, then  daily prn pain. 30 tablet 3  . Norethindrone Acetate-Ethinyl Estrad-FE (BLISOVI 24 FE) 1-20 MG-MCG(24) tablet Take 1 tablet by mouth daily. 91 tablet 2  . phentermine (ADIPEX-P) 37.5 MG tablet One tab by mouth qAM 30 tablet 0  . polyethylene glycol (MIRALAX / GLYCOLAX) packet Take 17 g by mouth 2 (two) times daily. Until stooling regularly 30 packet 11  . Probiotic Product (PROBIOTIC-10 PO) Take by mouth.    . ranitidine (ZANTAC) 300 MG tablet Take 0.5 tablets (150 mg total) by mouth 2 (two) times daily. 180 tablet 3  . Vitamin D, Ergocalciferol, (DRISDOL) 50000 units CAPS capsule TAKE 1 CAPSULE (50,000 UNITS TOTAL) BY MOUTH EVERY 7 (SEVEN) DAYS. 8 capsule 0   No current facility-administered medications for this visit.    Allergies  Allergen Reactions  . Abilify [Aripiprazole]     Panic attacks when coupled with cymbalta       Objective:  BP (!) 145/86   Pulse 74   Wt 134 lb (60.8 kg)   BMI 23.36 kg/m  Gen:  alert, not ill-appearing, no distress, appropriate for age HEENT: head normocephalic without obvious abnormality, conjunctiva and cornea clear, trachea midline Pulm: Normal work of breathing, normal phonation, clear to auscultation bilaterally,  no wheezes, rales or rhonchi CV: Normal rate, regular rhythm, s1 and s2 distinct, no murmurs, clicks or rubs  Neuro: alert and oriented x 3, no tremor MSK: extremities atraumatic, normal gait and station Skin: intact, no rashes on exposed skin, no jaundice, no cyanosis Psych: well-groomed, cooperative, good eye contact, slightly flat affect, speech is articulate, and thought processes clear and goal-directed   No results found for this or any previous visit (from the past 72 hour(s)). No results found.  Depression screen [facility] 2/9 08/20/2017 07/30/2017 07/23/2017 04/22/2017 01/20/2017  Decreased Interest 0 1  Down, Depressed, Hopeless PHQ - 2 Score Altered sleeping 0 2 2 0 -  Tired, decreased energy 2 0 1 2  -  Change in appetite 0 0 0 0 -  Feeling bad or failure about yourself  -  Trouble concentrating 0 0 1 2 -  Moving slowly or fidgety/restless 0 0 0 0 -  Suicidal thoughts 0 0 0 0 -  PHQ-9 Score -  Difficult doing work/chores Somewhat difficult - Somewhat difficult - -   GAD 7 : Generalized Anxiety Score 08/20/2017 04/22/2017 12/23/2016 11/20/2016  Nervous, Anxious, on Edge 1 0 2 3  Control/stop worrying 0 0 2 2  Worry too much - different things Trouble relaxing 0 0 2 2  Restless 2 0 0 1  Easily annoyed or irritable Afraid - awful might happen 0 0 2 1  Total GAD 7 Score Anxiety Difficulty Somewhat difficult - - -      Assessment and Plan: 51 y.o. female with   1. Anxiety and depression - PHQ9 score 7, mild-moderate; GAD7 score 8; no acute safety issues. Continue current medications and follow-up with Psychiatry to augment current therapy. We will continue to provide her Xanax prescription. - ALPRAZolam (XANAX) 0.5 MG tablet; Take 1 tablet (0.5 mg total) by mouth as needed for anxiety.  Dispense: 30 tablet; Refill: 2  2. Essential hypertension, benign BP Readings from Last 3 Encounters:  08/20/17 (!) 145/86  07/30/17 131/84  07/23/17 125/78  - declines medication management at this time - therapeutic lifestyle changes - follow-up in 3 months  3. Abnormal weight gain - additional 3 pound weight loss over 3 weeks. She has reached a normal BMI. Reducing phentermine to  daily. Plan to continue until patient reaches goal of 125 pounds or 3 months, whichever is first.  Wt Readings from Last 3 Encounters:  08/20/17 134 lb (60.8 kg)  07/30/17 137 lb 11.2 oz (62.5 kg)  07/23/17 141 lb (64 kg)  - phentermine 15 MG capsule; Take 1 capsule (15 mg total) by mouth every morning.  Dispense: 30 capsule; Refill: 0  Patient education and anticipatory guidance given Patient agrees with treatment plan Follow-up in 4 weeks for nurse visit  weight check/BP check or sooner as needed if symptoms worsen or fail to improve  Levonne Hubert PA-C

## 2017-08-20 NOTE — Telephone Encounter (Signed)
RF request received from [facility] for a 90 day supply of Blisovi so that ins will cover.  Authorization sent.

## 2017-08-20 NOTE — Patient Instructions (Addendum)
-   Call our office if you decide you want a referral to counseling. Look up Merchandiser, retail Manufacturing engineer [employer]) and [employer] (Artist, [employer])  - Follow-up with Psychiatry to medication management. Once you are on a stable regimen, you can either continue to see Psychiatry or we can manage your refills.  For weight - continue Phentermine at reduced dose - aim for calorie intake of 1300-1400 per day to continue losing weight - measure intake with an app such as MyFitnessPal - continue regular cardiovascular exercise  - Qsymia (contains phentermine and topamax) - Belviq - Contrave - Saxenda

## 2017-08-25 ENCOUNTER — Other Ambulatory Visit: Payer: Self-pay | Admitting: Sports Medicine

## 2017-08-25 DIAGNOSIS — M2240 Chondromalacia patellae, unspecified knee: Secondary | ICD-10-CM

## 2017-08-31 DIAGNOSIS — Z79899 Other long term (current) drug therapy: Secondary | ICD-10-CM | POA: Insufficient documentation

## 2017-09-10 ENCOUNTER — Encounter (HOSPITAL_COMMUNITY): Payer: Self-pay | Admitting: Psychiatry

## 2017-09-10 ENCOUNTER — Ambulatory Visit (INDEPENDENT_AMBULATORY_CARE_PROVIDER_SITE_OTHER): Payer: 59 | Admitting: Psychiatry

## 2017-09-10 VITALS — BP 130/72 | HR 75 | Ht 63.5 in | Wt 130.4 lb

## 2017-09-10 DIAGNOSIS — Z818 Family history of other mental and behavioral disorders: Secondary | ICD-10-CM | POA: Diagnosis not present

## 2017-09-10 DIAGNOSIS — R454 Irritability and anger: Secondary | ICD-10-CM

## 2017-09-10 DIAGNOSIS — F411 Generalized anxiety disorder: Secondary | ICD-10-CM

## 2017-09-10 DIAGNOSIS — R5383 Other fatigue: Secondary | ICD-10-CM | POA: Diagnosis not present

## 2017-09-10 DIAGNOSIS — F332 Major depressive disorder, recurrent severe without psychotic features: Secondary | ICD-10-CM | POA: Diagnosis not present

## 2017-09-10 DIAGNOSIS — Z630 Problems in relationship with spouse or partner: Secondary | ICD-10-CM

## 2017-09-10 DIAGNOSIS — F410 Panic disorder [episodic paroxysmal anxiety] without agoraphobia: Secondary | ICD-10-CM | POA: Diagnosis not present

## 2017-09-10 DIAGNOSIS — R4582 Worries: Secondary | ICD-10-CM

## 2017-09-10 DIAGNOSIS — F4010 Social phobia, unspecified: Secondary | ICD-10-CM

## 2017-09-10 NOTE — Progress Notes (Addendum)
Psychiatric Initial Adult Assessment   Patient Identification: Rachael [name] MRN:  [id number] Date of Evaluation:  09/10/2017 Referral Source: Primary care physician.   Chief Complaint:  I think my medicine not working. Visit Diagnosis:    ICD-10-CM   1. GAD (generalized anxiety disorder) F41.1     History of Present Illness: Patient is 51 year old Caucasian, unemployed married female who is referred from primary care physician for the management of anxiety and depression.  Patient reported long history of depression and anxiety symptoms but lately symptoms are getting worse.  She endorses difficulty going to the social activities, getting easily overwhelmed, irritable, decreased attention, concentration, unorganized, fatigue, isolated, tired, low self-esteem and sadness.  She also endorses continues to have negative thoughts and some time hopelessness.  She struggled with depression since age 37.  She had tried numerous psychotropic medication and currently she is taking Cymbalta and BuSpar.  Her BuSpar was recently added 4 months ago to help with anxiety and panic attacks.  She was prescribed Xanax and she was taking more than usual and her primary care physician recommended to take BuSpar which is helping the anxiety but she does not feel her depression is getting better.  Though she denies any suicidal thoughts, paranoia, hallucinations but endorses chronic fatigue, lack of motivation and decreased energy.  She does not like crowded places, height and social activities.  Patient did not trigger any specific triggers but admitted having marital stress.  They have been married for many years but lately she is seeing lack of communication and not able to do things together with the husband.  She is busy taking care of the kids.  She also endorsed lately going through menopause.  She admitted weight gain but now she is taking phentermine for weight loss.  She lost 20 pounds in past 4 months.  Currently  she is taking Cymbalta 20 mg twice a day, BuSpar 10 mg twice a day.  She admitted having panic attacks and usually when she takes Xanax it helps.  Patient denies any violence, mania, psychosis, anger, rage, phobia, OCD or any PTSD symptoms.  She is been very reluctant to add new medication because she does not want any medication that caused weight gain.  Patient denies drinking alcohol or using any illegal substances.  She is currently not seeing any therapist but open to see one if needed.   Associated Signs/Symptoms: Depression Symptoms:  depressed mood, fatigue, difficulty concentrating, hopelessness, anxiety, panic attacks, (Hypo) Manic Symptoms:  Irritable Mood, Anxiety Symptoms:  Excessive Worry, Panic Symptoms, Social Anxiety, Psychotic Symptoms:  No psychotic symptoms PTSD Symptoms: Negative  Past Psychiatric History: Patient reported history of depression since age 52.  She had seen briefly psychiatrist but most of the time her medicines were given by primary care physician.  She had tried Paxil, Wellbutrin, Cymbalta, Abilify, Lexapro and Zoloft.  Patient denies any history of suicidal attempt or any inpatient psychiatric treatment.  She denies any history of mania or psychosis.  Previous Psychotropic Medications: Yes   Substance Abuse History in the last 12 months:  No.  Consequences of Substance Abuse: Negative  Past Medical History:  Past Medical History:  Diagnosis Date  . Anxiety   . Depression   . Hypertension     Past Surgical History:  Procedure Laterality Date  . CESAREAN SECTION  01/04/1999    Family Psychiatric History: Patient believe grandmother from the father's side may have anxiety illness.  Family History:  Family History  Problem Relation Age  of Onset  . Breast cancer Unknown        aunts  . Diabetes Unknown        grandmother   . Stroke Unknown        grandmother   . Hypertension Father   . Hyperlipidemia Father   . Asthma Mother   . COPD  Mother     Social History:   Social History   Social History  . Marital status: Married    Spouse name: N/A  . Number of children: N/A  . Years of education: N/A   Social History Main Topics  . Smoking status: Never Smoker  . Smokeless tobacco: Never Used  . Alcohol use No  . Drug use: No  . Sexual activity: Yes    Partners: Male    Birth control/ protection: None   Other Topics Concern  . Not on file   Social History Narrative  . No narrative on file    Additional Social History: Patient born in [place] and grew up there.  She C her parents every few weeks.  Patient moved to this area after her husband's job relocated.  She has been married with her husband for many years.  They have 3 children.  11-year-old, 57 year old and 31 year old daughter.  Patient has social not read through the church.  She try to keep herself busy in church activities and work out.  She has limited social network.  Allergies:   Allergies  Allergen Reactions  . Abilify [Aripiprazole]     Panic attacks when coupled with cymbalta    Metabolic Disorder Labs: No results found for: HGBA1C, MPG No results found for: PROLACTIN Lab Results  Component Value Date   CHOL 202 (H) 09/09/2016   TRIG 79 09/09/2016   HDL 49 09/09/2016   CHOLHDL 4.1 09/09/2016   VLDL 16 09/09/2016   LDLCALC 137 (H) 09/09/2016     Current Medications: Current Outpatient Prescriptions  Medication Sig Dispense Refill  . ALPRAZolam (XANAX) 0.5 MG tablet Take 1 tablet (0.5 mg total) by mouth as needed for anxiety. 30 tablet 2  . Biotin (BIOTIN 5000) 5 MG CAPS Take by mouth.    . busPIRone (BUSPAR) 10 MG tablet Take 1 tablet (10 mg total) by mouth 3 (three) times daily. 90 tablet 2  . DULoxetine (CYMBALTA) 20 MG capsule TAKE 1 CAPSULE (20 MG TOTAL) BY MOUTH 2 (TWO) TIMES DAILY. 60 capsule 2  . meloxicam (MOBIC) 15 MG tablet TAKE 1 TABLET BY MOUTH EVERY MORNING WITH BRAKFAST AS NEEDED FOR PAIN 30 tablet 3  . Norethindrone  Acetate-Ethinyl Estrad-FE (BLISOVI 24 FE) 1-20 MG-MCG(24) tablet Take 1 tablet by mouth daily. 91 tablet 2  . phentermine 15 MG capsule Take 1 capsule (15 mg total) by mouth every morning. 30 capsule 0  . polyethylene glycol (MIRALAX / GLYCOLAX) packet Take 17 g by mouth 2 (two) times daily. Until stooling regularly 30 packet 11  . Probiotic Product (PROBIOTIC-10 PO) Take by mouth.    . ranitidine (ZANTAC) 300 MG tablet Take 0.5 tablets (150 mg total) by mouth 2 (two) times daily. 180 tablet 3  . Vitamin D, Ergocalciferol, (DRISDOL) 50000 units CAPS capsule TAKE 1 CAPSULE (50,000 UNITS TOTAL) BY MOUTH EVERY 7 (SEVEN) DAYS. 8 capsule 0   No current facility-administered medications for this visit.     Neurologic: Headache: No Seizure: No Paresthesias:No  Musculoskeletal: Strength & Muscle Tone: within normal limits Gait & Station: normal Patient leans: N/A  Psychiatric Specialty Exam:  ROS  Blood pressure 130/72, pulse 75, height 5' 3.5" (1.613 m), weight 130 lb 6.4 oz (59.1 kg).Body mass index is 22.74 kg/m.  General Appearance: Well Groomed  Eye Contact:  Good  Speech:  Clear and Coherent  Volume:  Normal  Mood:  Anxious  Affect:  Congruent  Thought Process:  Goal Directed  Orientation:  Full (Time, Place, and Person)  Thought Content:  Rumination  Suicidal Thoughts:  No  Homicidal Thoughts:  No  Memory:  Immediate;   Good Recent;   Good Remote;   Good  Judgement:  Good  Insight:  Good  Psychomotor Activity:  Normal  Concentration:  Concentration: Good and Attention Span: Good  Recall:  Good  Fund of Knowledge:Good  Language: Good  Akathisia:  No  Handed:  Right  AIMS (if indicated):  0  Assets:  Communication Skills Desire for Improvement Housing Resilience Social Support  ADL's:  Intact  Cognition: WNL  Sleep: Good   Assessment: Major depressive disorder, recurrent.  Generalized anxiety disorder.  Panic attacks   Plan: I review her symptoms, history,  current medication, psychosocial stressors.  Patient continues to have residual symptoms of depression and anxiety.  She is very reluctant to change to a new medication but agreed to try higher dose of Cymbalta.  She also preferred to continue her follow-up at primary care physician's office.  I explained that she should try Cymbalta 60 mg single dose or 30 mg twice a day.  We also discussed to see a therapist and patient will call primary care physician to schedule appointment however if she had difficulty getting therapist appointment she can call us and we will scheduled appointment in our office.  I spent a lot of time talking about psychotropic medication, side effects and benefits.  We also discussed long-term prognosis of the illness.  Discussed safety concerns at any time having active suicidal thoughts or homicidal thought that she need to call 911 or go to local emergency room.  We will not schedule any appointment however if patient needs to follow-up she will call us to schedule appointment.  I will forward my note to her primary care physician Dr. Rosita Keaharlie Cummings  Meriam Chojnowski T., MD 10/31/201811:09 AM

## 2017-09-17 ENCOUNTER — Ambulatory Visit (INDEPENDENT_AMBULATORY_CARE_PROVIDER_SITE_OTHER): Payer: 59 | Admitting: Physician Assistant

## 2017-09-17 DIAGNOSIS — R635 Abnormal weight gain: Secondary | ICD-10-CM

## 2017-09-17 MED ORDER — PHENTERMINE HCL 15 MG PO CAPS: 15.0000 mg | ORAL_CAPSULE | ORAL | 0 refills | Status: DC

## 2017-09-17 NOTE — Progress Notes (Signed)
Patient is here for blood pressure and weight check. Denies any trouble sleeping, palpitations, or any other medication problems. Patient has lost weight. A refill for Phentermine will be sent to patient preferred pharmacy. Patient advised to schedule a four week nurse visit and keep her upcoming appointment with her PCP. Verbalized understanding, no further questions. 

## 2017-10-15 ENCOUNTER — Encounter: Payer: Self-pay | Admitting: Physician Assistant

## 2017-10-15 ENCOUNTER — Ambulatory Visit: Payer: 59 | Admitting: Physician Assistant

## 2017-10-15 VITALS — BP 118/78 | HR 75 | Wt 125.0 lb

## 2017-10-15 DIAGNOSIS — Z79899 Other long term (current) drug therapy: Secondary | ICD-10-CM | POA: Diagnosis not present

## 2017-10-15 DIAGNOSIS — F329 Major depressive disorder, single episode, unspecified: Secondary | ICD-10-CM

## 2017-10-15 DIAGNOSIS — F419 Anxiety disorder, unspecified: Secondary | ICD-10-CM

## 2017-10-15 MED ORDER — VITAMIN D-3 25 MCG (1000 UT) PO CAPS
ORAL_CAPSULE | ORAL | Status: DC
Start: 2017-10-15 — End: 2018-12-22

## 2017-10-15 NOTE — Patient Instructions (Addendum)
Private Practice: [facility] Carrier, [place] [phone]  Online: [facility]  [facility]: Claudius SisMary Alice Bowman, [place] Sarah Soloman, [place] Terri [place], [place] Sar[place]ie Whitt, [place]

## 2017-10-15 NOTE — Progress Notes (Signed)
HPI:                                                                Rachael MayersSheila [name] is a 51 y.o. female who [facility] [place] Medicine today for anxiety/depression follow-up  Depression/Anxiety: taking Cymbalta 20mg  bid and Buspar 10mg  bid without difficulty. She was evaluated by psychiatry, who recommended increasing her Cymbalta. She opted not to due to concerns about weight gain. She reports irritability, mostly in the afternoons/mid-day. She also states since being on Phentermine she has been waking up at 4am and unable to fall back asleep. Denies symptoms of mania/hypomania. Denies suicidal thinking. Denies auditory/visual hallucinations.   Past Medical History:  Diagnosis Date  . Anxiety   . Depression   . Hypertension    Past Surgical History:  Procedure Laterality Date  . CESAREAN SECTION  01/04/1999   Social History   Tobacco Use  . Smoking status: Never Smoker  . Smokeless tobacco: Never Used  Substance Use Topics  . Alcohol use: No   family history includes Asthma in her mother; Breast cancer in her unknown relative; COPD in her mother; Diabetes in her unknown relative; Hyperlipidemia in her father; Hypertension in her father; Stroke in her unknown relative.  ROS: negative except as noted in the HPI  Medications: Current Outpatient Medications  Medication Sig Dispense Refill  . ALPRAZolam (XANAX) 0.5 MG tablet Take 1 tablet (0.5 mg total) by mouth as needed for anxiety. 30 tablet 2  . Biotin (BIOTIN 5000) 5 MG CAPS Take by mouth.    . busPIRone (BUSPAR) 10 MG tablet Take 1 tablet (10 mg total) by mouth 3 (three) times daily. 90 tablet 2  . DULoxetine (CYMBALTA) 20 MG capsule TAKE 1 CAPSULE (20 MG TOTAL) BY MOUTH 2 (TWO) TIMES DAILY. 60 capsule 2  . meloxicam (MOBIC) 15 MG tablet TAKE 1 TABLET BY MOUTH EVERY MORNING WITH BRAKFAST AS NEEDED FOR PAIN 30 tablet 3  . Norethindrone Acetate-Ethinyl Estrad-FE (BLISOVI 24 FE) 1-20  MG-MCG(24) tablet Take 1 tablet by mouth daily. 91 tablet 2  . polyethylene glycol (MIRALAX / GLYCOLAX) packet Take 17 g by mouth 2 (two) times daily. Until stooling regularly 30 packet 11  . Probiotic Product (PROBIOTIC-10 PO) Take by mouth.    . ranitidine (ZANTAC) 300 MG tablet Take 0.5 tablets (150 mg total) by mouth 2 (two) times daily. 180 tablet 3  . Vitamin D, Ergocalciferol, (DRISDOL) 50000 units CAPS capsule TAKE 1 CAPSULE (50,000 UNITS TOTAL) BY MOUTH EVERY 7 (SEVEN) DAYS. 8 capsule 0  . Cholecalciferol (VITAMIN D-3) 1000 units CAPS 1 capsule PO daily 60 capsule    No current facility-administered medications for this visit.    Allergies  Allergen Reactions  . Abilify [Aripiprazole]     Panic attacks when coupled with cymbalta       Objective:  BP 118/78   Pulse 75   Wt 125 lb (56.7 kg)   BMI 21.80 kg/m  Gen:  alert, not ill-appearing, no distress, appropriate for age HEENT: head normocephalic without obvious abnormality, conjunctiva and cornea clear, trachea midline Pulm: Normal work of breathing, normal phonation  Neuro: alert and oriented x 3, no tremor MSK: extremities atraumatic, normal gait and station Skin: intact, no rashes  on exposed skin, no jaundice, no cyanosis Psych: well-groomed, cooperative, good eye contact, euthymic mood, affect mood-congruent, speech is articulate, and thought processes clear and goal-directed  Depression screen [facility] 2[facility] 10/15/2017 08/20/2017 07/30/2017 07/23/2017 04/22/2017  Decreased Interest 1 1 1 2  0  Down, Depressed, Hopeless 1 2 1 2 2   PHQ - 2 Score 2 3 2 4 2   Altered sleeping 2 0 2 2 0  Tired, decreased energy 0 2 0 1 2  Change in appetite 0 0 0 0 0  Feeling bad or failure about yourself  2 2 1 2 3   Trouble concentrating 2 0 0 1 2  Moving slowly or fidgety/restless 0 0 0 0 0  Suicidal thoughts 0 0 0 0 0  PHQ-9 Score 8 7 5 10 9   Difficult doing work/chores Somewhat difficult Somewhat difficult - Somewhat difficult -   GAD 7  : Generalized Anxiety Score 10/15/2017 08/20/2017 04/22/2017 12/23/2016  Nervous, Anxious, on Edge 3 1 0 2  Control/stop worrying 1 0 0 2  Worry too much - different things 2 2 1 3   Trouble relaxing 1 0 0 2  Restless 1 2 0 0  Easily annoyed or irritable 3 3 3 3   Afraid - awful might happen 0 0 0 2  Total GAD 7 Score 11 8 4 14   Anxiety Difficulty - Somewhat difficult - -      No results found for this or any previous visit (from the past 72 hour(s)). No results found.    Assessment and Plan: 51 y.o. female with   1. Encounter for long-term current use of medication   2. Anxiety and depression - PHQ9 score 8, mild-moderate; GAD7 score 11, moderate; slightly worsened compared to 8 weeks ago - recommend CBT to augment medication, rather than increasing dose. She will also add Buspar 10mg  in the afternoon - she was given multiple psychology referral options and is going to research them on her own - stopping Phentermine, patient is at weight goal - follow-up in 4 months   Patient education and anticipatory guidance given Patient agrees with treatment plan Follow-up in 4 months or sooner as needed if symptoms worsen or fail to improve  I spent 25 minutes with this patient, greater than 50% was face-to-face time counseling regarding the above diagnoses   Levonne Hubertharley E. Roselin Wiemann PA-C

## 2017-11-21 ENCOUNTER — Other Ambulatory Visit: Payer: Self-pay

## 2017-11-21 MED ORDER — BUSPIRONE HCL 7.5 MG PO TABS: 11.2500 mg | ORAL_TABLET | Freq: Three times a day (TID) | ORAL | 2 refills | Status: DC

## 2018-03-04 ENCOUNTER — Encounter: Payer: Self-pay | Admitting: Physician Assistant

## 2018-03-04 ENCOUNTER — Ambulatory Visit (INDEPENDENT_AMBULATORY_CARE_PROVIDER_SITE_OTHER): Payer: 59 | Admitting: Physician Assistant

## 2018-03-04 VITALS — BP 123/74 | HR 74 | Temp 98.8°F | Resp 16 | Wt 130.0 lb

## 2018-03-04 DIAGNOSIS — J0300 Acute streptococcal tonsillitis, unspecified: Secondary | ICD-10-CM

## 2018-03-04 LAB — POCT RAPID STREP A (OFFICE): RAPID STREP A SCREEN: POSITIVE — AB

## 2018-03-04 MED ORDER — PENICILLIN G BENZATHINE 1200000 UNIT/2ML IM SUSP
1.2000 10*6.[IU] | Freq: Once | INTRAMUSCULAR | Status: AC
  Administered 2018-03-04: 1.2 10*6.[IU] via INTRAMUSCULAR

## 2018-03-04 NOTE — Progress Notes (Signed)
HPI:                                                                Rachael MayersSheila [name] is a 52 y.o. female who [facility] [place] today for myalgias  Sore Throat   This is a new problem. The current episode started in the past 7 days. The problem has been gradually worsening. Neither side of throat is experiencing more pain than the other. There has been no fever. The pain is mild. Pertinent negatives include no congestion or coughing. Associated symptoms comments: + myalgia + malaise + anorexia. She has had exposure to strep. She has tried NSAIDs for the symptoms. The treatment provided mild relief.     Past Medical History:  Diagnosis Date  . Anxiety   . Depression   . Hypertension    Past Surgical History:  Procedure Laterality Date  . CESAREAN SECTION  01/04/1999   Social History   Tobacco Use  . Smoking status: Never Smoker  . Smokeless tobacco: Never Used  Substance Use Topics  . Alcohol use: No   family history includes Asthma in her mother; Breast cancer in her unknown relative; COPD in her mother; Diabetes in her unknown relative; Hyperlipidemia in her father; Hypertension in her father; Stroke in her unknown relative.    ROS: negative except as noted in the HPI  Medications: Current Outpatient Medications  Medication Sig Dispense Refill  . ALPRAZolam (XANAX) 0.5 MG tablet Take 1 tablet (0.5 mg total) by mouth as needed for anxiety. 30 tablet 2  . Biotin (BIOTIN 5000) 5 MG CAPS Take by mouth.    . busPIRone (BUSPAR) 7.5 MG tablet Take 1.5 tablets (11.25 mg total) by mouth 3 (three) times daily. 135 tablet 2  . Cholecalciferol (VITAMIN D-3) 1000 units CAPS 1 capsule PO daily 60 capsule   . DULoxetine (CYMBALTA) 20 MG capsule TAKE 1 CAPSULE (20 MG TOTAL) BY MOUTH 2 (TWO) TIMES DAILY. 60 capsule 2  . meloxicam (MOBIC) 15 MG tablet TAKE 1 TABLET BY MOUTH EVERY MORNING WITH BRAKFAST AS NEEDED FOR PAIN 30 tablet 3  .  Norethindrone Acetate-Ethinyl Estrad-FE (BLISOVI 24 FE) 1-20 MG-MCG(24) tablet Take 1 tablet by mouth daily. 91 tablet 2  . polyethylene glycol (MIRALAX / GLYCOLAX) packet Take 17 g by mouth 2 (two) times daily. Until stooling regularly 30 packet 11  . Probiotic Product (PROBIOTIC-10 PO) Take by mouth.    . ranitidine (ZANTAC) 300 MG tablet Take 0.5 tablets (150 mg total) by mouth 2 (two) times daily. 180 tablet 3  . Vitamin D, Ergocalciferol, (DRISDOL) 50000 units CAPS capsule TAKE 1 CAPSULE (50,000 UNITS TOTAL) BY MOUTH EVERY 7 (SEVEN) DAYS. 8 capsule 0   No current facility-administered medications for this visit.    Allergies  Allergen Reactions  . Abilify [Aripiprazole]     Panic attacks when coupled with cymbalta       Objective:  BP 123/74   Pulse 74   Temp 98.8 F (37.1 C) (Oral)   Resp 16   Wt 130 lb (59 kg)   SpO2 100%   BMI 22.67 kg/m  Gen:  alert, ill-appearing, not toxic-appearing, no distress, appropriate for age HEENT: head normocephalic without obvious abnormality, conjunctiva and cornea  clear, left TM partially obstructed by cerumen, right TM semitransparent and pearly gray, tonsils 2+ with erythema, mild edema and exudates, tender tonsillar adenopathy, trachea midline Pulm: Normal work of breathing, normal phonation, clear to auscultation bilaterally, no wheezes, rales or rhonchi CV: Normal rate, regular rhythm, s1 and s2 distinct, no murmurs, clicks or rubs  Neuro: alert and oriented x 3, no tremor MSK: extremities atraumatic, normal gait and station Skin: intact, no rashes on exposed skin, no jaundice, no cyanosis   No results found for this or any previous visit (from the past 72 hour(s)). No results found.    Assessment and Plan: 52 y.o. female with   Streptococcal tonsillitis - Plan: POCT rapid strep A, penicillin g benzathine (BICILLIN LA) 1200000 UNIT/2ML injection 1.2 Million Units   Patient education and anticipatory guidance given Patient  agrees with treatment plan Follow-up as needed if symptoms worsen or fail to improve  Levonne Hubert PA-C

## 2018-03-04 NOTE — Patient Instructions (Signed)
Tonsillitis Tonsillitis is an infection of the throat that causes the tonsils to become red, tender, and swollen. Tonsils are collections of lymphoid tissue at the back of the throat. Each tonsil has crevices (crypts). Tonsils help fight nose and throat infections and keep infection from spreading to other parts of the body for the first 18 months of life. What are the causes? Sudden (acute) tonsillitis is usually caused by infection with streptococcal bacteria. Long-lasting (chronic) tonsillitis occurs when the crypts of the tonsils become filled with pieces of food and bacteria, which makes it easy for the tonsils to become repeatedly infected. What are the signs or symptoms? Symptoms of tonsillitis include:  A sore throat, with possible difficulty swallowing.  White patches on the tonsils.  Fever.  Tiredness.  New episodes of snoring during sleep, when you did not snore before.  Small, foul-smelling, yellowish-white pieces of material (tonsilloliths) that you occasionally cough up or spit out. The tonsilloliths can also cause you to have bad breath.  How is this diagnosed? Tonsillitis can be diagnosed through a physical exam. Diagnosis can be confirmed with the results of lab tests, including a throat culture. How is this treated? The goals of tonsillitis treatment include the reduction of the severity and duration of symptoms and prevention of associated conditions. Symptoms of tonsillitis can be improved with the use of steroids to reduce the swelling. Tonsillitis caused by bacteria can be treated with antibiotic medicines. Usually, treatment with antibiotic medicines is started before the cause of the tonsillitis is known. However, if it is determined that the cause is not bacterial, antibiotic medicines will not treat the tonsillitis. If attacks of tonsillitis are severe and frequent, your health care provider may recommend surgery to remove the tonsils (tonsillectomy). Follow these  instructions at home:  Rest as much as possible and get plenty of sleep.  Drink plenty of fluids. While the throat is very sore, eat soft foods or liquids, such as sherbet, soups, or instant breakfast drinks.  Eat frozen ice pops.  Gargle with a warm or cold liquid to help soothe the throat. Mix 1/4 teaspoon of salt and 1/4 teaspoon of baking soda in 8 oz of water. Contact a health care provider if:  Large, tender lumps develop in your neck.  A rash develops.  A green, yellow-brown, or bloody substance is coughed up.  You are unable to swallow liquids or food for 24 hours.  You notice that only one of the tonsils is swollen. Get help right away if:  You develop any new symptoms such as vomiting, severe headache, stiff neck, chest pain, or trouble breathing or swallowing.  You have severe throat pain along with drooling or voice changes.  You have severe pain, unrelieved with recommended medications.  You are unable to fully open the mouth.  You develop redness, swelling, or severe pain anywhere in the neck.  You have a fever. This information is not intended to replace advice given to you by your health care provider. Make sure you discuss any questions you have with your health care provider. Document Released: 08/07/2005 Document Revised: 04/04/2016 Document Reviewed: 04/16/2013 Elsevier Interactive Patient Education  2017 [employer]  

## 2018-03-07 ENCOUNTER — Other Ambulatory Visit: Payer: Self-pay | Admitting: Physician Assistant

## 2018-03-07 DIAGNOSIS — F3341 Major depressive disorder, recurrent, in partial remission: Secondary | ICD-10-CM

## 2018-03-08 ENCOUNTER — Other Ambulatory Visit: Payer: Self-pay | Admitting: Physician Assistant

## 2018-03-08 DIAGNOSIS — K219 Gastro-esophageal reflux disease without esophagitis: Secondary | ICD-10-CM

## 2018-05-04 LAB — HM COLONOSCOPY

## 2018-08-16 ENCOUNTER — Other Ambulatory Visit: Payer: Self-pay | Admitting: Physician Assistant

## 2018-09-10 ENCOUNTER — Other Ambulatory Visit: Payer: Self-pay | Admitting: Physician Assistant

## 2018-09-10 NOTE — Telephone Encounter (Signed)
Can you confirm how patient is currently taking the Buspar? I can't tell if this is automatic pharmacy refill, but 405 tabs seems like a lot for 1 refill

## 2018-09-11 ENCOUNTER — Other Ambulatory Visit: Payer: Self-pay | Admitting: Physician Assistant

## 2018-09-11 NOTE — Telephone Encounter (Signed)
Can you confirm how patient is currently taking the Buspar? I can't tell if this is automatic pharmacy refill, but 405 tabs seems like a lot for 1 refill 

## 2018-09-25 NOTE — Telephone Encounter (Signed)
Left VM for Pt to return clinic call.  

## 2018-12-16 ENCOUNTER — Ambulatory Visit: Payer: 59 | Admitting: Osteopathic Medicine

## 2018-12-16 ENCOUNTER — Encounter: Payer: Self-pay | Admitting: Osteopathic Medicine

## 2018-12-16 VITALS — BP 132/71 | HR 79 | Temp 98.4°F | Wt 135.5 lb

## 2018-12-16 DIAGNOSIS — H1032 Unspecified acute conjunctivitis, left eye: Secondary | ICD-10-CM | POA: Diagnosis not present

## 2018-12-16 DIAGNOSIS — H0289 Other specified disorders of eyelid: Secondary | ICD-10-CM

## 2018-12-16 MED ORDER — POLYMYXIN B-TRIMETHOPRIM 10000-0.1 UNIT/ML-% OP SOLN: 1.0000 [drp] | Freq: Four times a day (QID) | OPHTHALMIC | 0 refills | Status: DC

## 2018-12-16 NOTE — Progress Notes (Signed)
HPI: Rachael [name] is a 53 y.o. female who  has a past medical history of Anxiety, Depression, and Hypertension.  she presents to [facility] today, 12/16/18,  for chief complaint of:  Eye concern  . Location: L eyelid upper, L eye . Quality: had some blurred vision, crusty discharge this AM, eyelid feels irritated/swollen . Severity: mild . Duration: since this AM      At today's visit 12/16/18 ... PMH, PSH, FH reviewed and updated as needed.  Current medication list and allergy/intolerance hx reviewed and updated as needed. (See remainder of HPI, ROS, Phys Exam below)         ASSESSMENT/PLAN: The primary encounter diagnosis was Irritation of eyelid. A diagnosis of Acute conjunctivitis of left eye, unspecified acute conjunctivitis type was also pertinent to this visit.  Mild eye irritation, advised avoid contacts and will try drops if no better by tonight   Meds ordered this encounter  Medications  . trimethoprim-polymyxin b (POLYTRIM) ophthalmic solution    Sig: Place 1 drop into the left eye every 6 (six) hours.    Dispense:  10 mL    Refill:  0       Follow-up plan: Return if symptoms worsen or fail to improve.                                                 ################################################# ################################################# ################################################# #################################################    Current Meds  Medication Sig  . Biotin (BIOTIN 5000) 5 MG CAPS Take by mouth.  . busPIRone (BUSPAR) 7.5 MG tablet Take 1 tablet (7.5 mg total) by mouth 3 (three) times daily as needed. Due for follow up visit  . busPIRone (BUSPAR) 7.5 MG tablet Take 1.5 tablets (11.25 mg total) by mouth 3 (three) times daily. Due for follow up visit  . DULoxetine (CYMBALTA) 20 MG capsule TAKE 1 CAPSULE BY MOUTH 2 TIMES DAILY.  Marland Kitchen  Norethindrone Acetate-Ethinyl Estrad-FE (BLISOVI 24 FE) 1-20 MG-MCG(24) tablet Take 1 tablet by mouth daily.  . polyethylene glycol (MIRALAX / GLYCOLAX) packet Take 17 g by mouth 2 (two) times daily. Until stooling regularly  . Probiotic Product (PROBIOTIC-10 PO) Take by mouth.  . Vitamin D, Ergocalciferol, (DRISDOL) 50000 units CAPS capsule TAKE 1 CAPSULE (50,000 UNITS TOTAL) BY MOUTH EVERY 7 (SEVEN) DAYS.    Allergies  Allergen Reactions  . Abilify [Aripiprazole]     Panic attacks when coupled with cymbalta       Review of Systems:  Constitutional: No recent illness, no fever/chills  HEENT: No  headache, no vision change  Cardiac: No  chest pain, No  pressure, No palpitations  Respiratory:  No  shortness of breath. No  Cough  Skin: No  Rash   Exam:  BP 132/71 (BP Location: Left Arm, Patient Position: Sitting, Cuff Size: Normal)   Pulse 79   Temp 98.4 F (36.9 C) (Oral)   Wt 135 lb 8 oz (61.5 kg)   BMI 23.63 kg/m   Constitutional: VS see above. General Appearance: alert, well-developed, well-nourished, NAD  Eyes: Very mild swelling L upper eyelid but no erythema, some crusting at corner of eye but no significant conjunctival injection, non-icteric sclera  Ears, Nose, Mouth, Throat: MMM, Normal external inspection ears/nares/mouth/lips/gums.  Neck: No masses, trachea midline.   Respiratory: Normal respiratory effort.   Musculoskeletal: Gait normal.  Skin: warm, dry, intact.   Psychiatric: Normal judgment/insight. Normal mood and affect. Oriented x3.       Visit summary with medication list and pertinent instructions was printed for patient to review, patient was advised to alert [place] if any updates are needed. All questions at time of visit were answered - patient instructed to contact office with any additional concerns. ER/RTC precautions were reviewed with the patient and understanding verbalized.  Please note: voice recognition software was used to produce  this document, and typos may escape review. Please contact Dr. Lyn Hollingshead for any needed clarifications.    Follow up plan: Return if symptoms worsen or fail to improve.

## 2018-12-22 ENCOUNTER — Ambulatory Visit: Payer: 59 | Admitting: Physician Assistant

## 2018-12-22 ENCOUNTER — Encounter: Payer: Self-pay | Admitting: Physician Assistant

## 2018-12-22 VITALS — BP 122/79 | HR 78 | Wt 137.0 lb

## 2018-12-22 DIAGNOSIS — F341 Dysthymic disorder: Secondary | ICD-10-CM

## 2018-12-22 DIAGNOSIS — Z1379 Encounter for other screening for genetic and chromosomal anomalies: Secondary | ICD-10-CM

## 2018-12-22 DIAGNOSIS — F411 Generalized anxiety disorder: Secondary | ICD-10-CM

## 2018-12-22 NOTE — Progress Notes (Signed)
122/79 

## 2018-12-22 NOTE — Patient Instructions (Signed)
Living With Anxiety    After being diagnosed with an anxiety disorder, you may be relieved to know why you have felt or behaved a certain way. It is natural to also feel overwhelmed about the treatment ahead and what it will mean for your life. With care and support, you can manage this condition and recover from it.  How to cope with anxiety  Dealing with stress  Stress is your body’s reaction to life changes and events, both good and bad. Stress can last just a few hours or it can be ongoing. Stress can play a major role in anxiety, so it is important to learn both how to cope with stress and how to think about it differently.  Talk with your health care provider or a counselor to learn more about stress reduction. He or she may suggest some stress reduction techniques, such as:  · Music therapy. This can include creating or listening to music that you enjoy and that inspires you.  · Mindfulness-based meditation. This involves being aware of your normal breaths, rather than trying to control your breathing. It can be done while sitting or walking.  · Centering prayer. This is a kind of meditation that involves focusing on a word, phrase, or sacred image that is meaningful to you and that brings you peace.  · Deep breathing. To do this, expand your stomach and inhale slowly through your nose. Hold your breath for 3-5 seconds. Then exhale slowly, allowing your stomach muscles to relax.  · Self-talk. This is a skill where you identify thought patterns that lead to anxiety reactions and correct those thoughts.  · Muscle relaxation. This involves tensing muscles then relaxing them.  Choose a stress reduction technique that fits your lifestyle and personality. Stress reduction techniques take time and practice. Set aside 5-15 minutes a day to do them. Therapists can offer training in these techniques. The training may be covered by some insurance plans. Other things you can do to manage stress include:  · Keeping a  stress diary. This can help you learn what triggers your stress and ways to control your response.  · Thinking about how you respond to certain situations. You may not be able to control everything, but you can control your reaction.  · Making time for activities that help you relax, and not feeling guilty about spending your time in this way.  Therapy combined with coping and stress-reduction skills provides the best chance for successful treatment.  Medicines  Medicines can help ease symptoms. Medicines for anxiety include:  · Anti-anxiety drugs.  · Antidepressants.  · Beta-blockers.  Medicines may be used as the main treatment for anxiety disorder, along with therapy, or if other treatments are not working. Medicines should be prescribed by a health care provider.  Relationships  Relationships can play a big part in helping you recover. Try to spend more time connecting with trusted friends and family members. Consider going to couples counseling, taking family education classes, or going to family therapy. Therapy can help you and others better understand the condition.  How to recognize changes in your condition  Everyone has a different response to treatment for anxiety. Recovery from anxiety happens when symptoms decrease and stop interfering with your daily activities at home or work. This may mean that you will start to:  · Have better concentration and focus.  · Sleep better.  · Be less irritable.  · Have more energy.  · Have improved memory.  It is   important to recognize when your condition is getting worse. Contact your health care provider if your symptoms interfere with home or work and you do not feel like your condition is improving.  Where to find help and support:  You can get help and support from these sources:  · Self-help groups.  · Online and community organizations.  · A trusted spiritual leader.  · Couples counseling.  · Family education classes.  · Family therapy.  Follow these instructions  at home:  · Eat a healthy diet that includes plenty of vegetables, fruits, whole grains, low-fat dairy products, and lean protein. Do not eat a lot of foods that are high in solid fats, added sugars, or salt.  · Exercise. Most adults should do the following:  ? Exercise for at least 150 minutes each week. The exercise should increase your heart rate and make you sweat (moderate-intensity exercise).  ? Strengthening exercises at least twice a week.  · Cut down on caffeine, tobacco, alcohol, and other potentially harmful substances.  · Get the right amount and quality of sleep. Most adults need 7-9 hours of sleep each night.  · Make choices that simplify your life.  · Take over-the-counter and prescription medicines only as told by your health care provider.  · Avoid caffeine, alcohol, and certain over-the-counter cold medicines. These may make you feel worse. Ask your pharmacist which medicines to avoid.  · Keep all follow-up visits as told by your health care provider. This is important.  Questions to ask your health care provider  · Would I benefit from therapy?  · How often should I follow up with a health care provider?  · How long do I need to take medicine?  · Are there any long-term side effects of my medicine?  · Are there any alternatives to taking medicine?  Contact a health care provider if:  · You have a hard time staying focused or finishing daily tasks.  · You spend many hours a day feeling worried about everyday life.  · You become exhausted by worry.  · You start to have headaches, feel tense, or have nausea.  · You urinate more than normal.  · You have diarrhea.  Get help right away if:  · You have a racing heart and shortness of breath.  · You have thoughts of hurting yourself or others.  If you ever feel like you may hurt yourself or others, or have thoughts about taking your own life, get help right away. You can go to your nearest emergency department or call:  · Your local emergency services  (911 in the [place]  · A suicide crisis helpline, such as the National Suicide Prevention Lifeline at 1-800-273-8255. This is open 24-hours a day.  Summary  · Taking steps to deal with stress can help calm you.  · Medicines cannot cure anxiety disorders, but they can help ease symptoms.  · Family, friends, and partners can play a big part in helping you recover from an anxiety disorder.  This information is not intended to replace advice given to you by your health care provider. Make sure you discuss any questions you have with your health care provider.  Document Released: 10/22/2016 Document Revised: 10/22/2016 Document Reviewed: 10/22/2016  Elsevier Interactive Patient Education © 2019 [employer]

## 2018-12-22 NOTE — Progress Notes (Signed)
HPI:                                                                Rachael MayersSheila [name] is a 53 y.o. female who [facility] [place] today for anxiety/depression follow-up  She was evaluated by a different provider last May and Duloxetine was decreased from 20 mg bid to 30 mg QD, Buspar was scheduled at 10 mg twice a day. States she has been struggling for a few months with feeling overwhelmed, scattered, decreased concentration. States she feels more depressed in the morning. After her morning work-out, her mood improves. She exercises 5 days per week.  She notices more anxiety in the afternoons, especially palpitations/heart racing. States Buspirone in combination with breathing exercises is helpful for preventing a panic attack. She is taking an additional 10 mg dose of Buspirone prn 1-3 days per week. Overall she just feels frustrated and doesn't feel like her medications are really working for her.  [facility] 12/16/2018 10/15/2017 10/15/2017 08/20/2017 07/30/2017  Decreased Interest 1 1 1 1 1   Down, Depressed, Hopeless 2 1 1 2 1   PHQ - 2 Score 3 2 2 3 2   Altered sleeping 1 2 2  0 2  Tired, decreased energy 1 0 0 2 0  Change in appetite 0 0 0 0 0  Feeling bad or failure about yourself  2 2 2 2 1   Trouble concentrating 2 2 2  0 0  Moving slowly or fidgety/restless 0 0 0 0 0  Suicidal thoughts 0 0 0 0 0  PHQ-9 Score 9 8 8 7 5   Difficult doing work/chores Somewhat difficult - Somewhat difficult Somewhat difficult -  Some recent data might be hidden    GAD 7 : Generalized Anxiety Score 12/16/2018 10/15/2017 10/15/2017 08/20/2017  Nervous, Anxious, on Edge 3 3 3 1   Control/stop worrying 1 1 1  0  Worry too much - different things 1 2 2 2   Trouble relaxing 2 1 1  0  Restless 0 1 1 2   Easily annoyed or irritable 3 3 3 3   Afraid - awful might happen 1 0 0 0  Total GAD 7 Score 11 11 11 8   Anxiety Difficulty Somewhat difficult - -  Somewhat difficult      Past Medical History:  Diagnosis Date  . Anxiety   . Depression   . Hypertension    Past Surgical History:  Procedure Laterality Date  . CESAREAN SECTION  01/04/1999   Social History   Tobacco Use  . Smoking status: Never Smoker  . Smokeless tobacco: Never Used  Substance Use Topics  . Alcohol use: No   family history includes Asthma in her mother; Breast cancer in her unknown relative; COPD in her mother; Diabetes in her unknown relative; Hyperlipidemia in her father; Hypertension in her father; Stroke in her unknown relative.    ROS: negative except as noted in the HPI  Medications: Current Outpatient Medications  Medication Sig Dispense Refill  . Biotin (BIOTIN 5000) 5 MG CAPS Take by mouth.    . busPIRone (BUSPAR) 7.5 MG tablet Take 1.5 tablets (11.25 mg total) by mouth 3 (three) times daily. Due for follow up visit 65 tablet 0  . DULoxetine (CYMBALTA) 20 MG  capsule TAKE 1 CAPSULE BY MOUTH 2 TIMES DAILY. 180 capsule 2  . Norethindrone Acetate-Ethinyl Estrad-FE (BLISOVI 24 FE) 1-20 MG-MCG(24) tablet Take 1 tablet by mouth daily. 91 tablet 2  . polyethylene glycol (MIRALAX / GLYCOLAX) packet Take 17 g by mouth 2 (two) times daily. Until stooling regularly 30 packet 11  . Probiotic Product (PROBIOTIC-10 PO) Take by mouth.    . trimethoprim-polymyxin b (POLYTRIM) ophthalmic solution Place 1 drop into the left eye every 6 (six) hours. 10 mL 0  . Vitamin D, Ergocalciferol, (DRISDOL) 50000 units CAPS capsule TAKE 1 CAPSULE (50,000 UNITS TOTAL) BY MOUTH EVERY 7 (SEVEN) DAYS. 8 capsule 0   No current facility-administered medications for this visit.    Allergies  Allergen Reactions  . Abilify [Aripiprazole]     Panic attacks when coupled with cymbalta       Objective:  BP 122/79   Pulse 78   Wt 137 lb (62.1 kg)   BMI 23.89 kg/m  Gen:  alert, not ill-appearing, no distress, appropriate for age HEENT: head normocephalic without obvious  abnormality, conjunctiva and cornea clear, trachea midline Pulm: Normal work of breathing, normal phonation Neuro: alert and oriented x 3, no tremor MSK: extremities atraumatic, normal gait and station Skin: intact, no rashes on exposed skin, no jaundice, no cyanosis Psych: appearance casual, cooperative, good eye contact, depressed mood, affect mood-congruent, speech is articulate, and thought processes clear and goal-directed, good insight, normal judgment, no SI    No results found for this or any previous visit (from the past 72 hour(s)). No results found.    Assessment and Plan: 53 y.o. female with   .Rachael [name] was seen today for medication management.  Diagnoses and all orders for this visit:  Dysthymia  GAD (generalized anxiety disorder)    PHQ9=9, no acute safety issues GAD7=11 Multiple medication failures including Abilify, Effexor, and Viibryd Partial response to Duloxetine and Buspar Genesight testing ordered today. Benefits and limitations of genetic testing explained to patient and she consented to buccal swab today Await results of genetic test prior to adjusting treatment plan  Patient education and anticipatory guidance given Patient agrees with treatment plan Follow-up pending test results or sooner as needed  Rachael Hubert PA-C

## 2018-12-27 ENCOUNTER — Telehealth: Payer: Self-pay | Admitting: Physician Assistant

## 2018-12-27 NOTE — Telephone Encounter (Signed)
I have results of patient's genetic testing Please instruct her to set up her MyChart so I can send her message She can also pick-up a copy from the front desk

## 2018-12-28 NOTE — Telephone Encounter (Signed)
Pt advised. She would like an OV to make sure Rx's are correct based on results. OV made.

## 2018-12-29 ENCOUNTER — Telehealth: Payer: Self-pay | Admitting: Physician Assistant

## 2018-12-29 NOTE — Telephone Encounter (Signed)
Left voicemail with patient regarding her Genesight test results Recommended she schedule an appointment to review these results since she does not have MyChart and we have been unable to reach her on two attempts

## 2018-12-30 ENCOUNTER — Ambulatory Visit: Payer: Self-pay | Admitting: Physician Assistant

## 2019-01-04 ENCOUNTER — Encounter: Payer: Self-pay | Admitting: Physician Assistant

## 2019-01-04 ENCOUNTER — Ambulatory Visit (INDEPENDENT_AMBULATORY_CARE_PROVIDER_SITE_OTHER): Payer: 59 | Admitting: Physician Assistant

## 2019-01-04 VITALS — BP 139/82 | HR 78 | Wt 137.0 lb

## 2019-01-04 DIAGNOSIS — G250 Essential tremor: Secondary | ICD-10-CM | POA: Insufficient documentation

## 2019-01-04 DIAGNOSIS — F330 Major depressive disorder, recurrent, mild: Secondary | ICD-10-CM | POA: Insufficient documentation

## 2019-01-04 MED ORDER — DULOXETINE HCL 20 MG PO CPEP: ORAL_CAPSULE | ORAL | 0 refills | Status: DC

## 2019-01-04 MED ORDER — VORTIOXETINE HBR 5 MG PO TABS: ORAL_TABLET | ORAL | 0 refills | Status: DC

## 2019-01-04 NOTE — Progress Notes (Signed)
HPI:                                                                Rachael [name] is a 53 y.o. female who presents to [name]: Primary Care [facility] Medicine today for genetic test results  Pleasant 53 yo F with longstanding history of treatment resistant depression and multiple medication failures most recently Cymbalta presents to review Genesight psychotropic test results.  She also complains of a chronic bilateral hand tremor that has been present for years, states it has been gradually worsening in the last year or 2 especially following exercise.  Tremors present at rest and with activities such as writing.  She has noticed a change in her handwriting which she attributes to the tremor.  She denies focal weakness, gait disturbance, falls.  She was evaluated by a different provider last May and Duloxetine was decreased from 20 mg bid to 30 mg QD, Buspar was scheduled at 10 mg twice a day. States she has been struggling for a few months with feeling overwhelmed, scattered, decreased concentration. States she feels more depressed in the morning. After her morning work-out, her mood improves. She exercises 5 days per week.  She notices more anxiety in the afternoons, especially palpitations/heart racing. States Buspirone in combination with breathing exercises is helpful for preventing a panic attack. She is taking an additional 10 mg dose of Buspirone prn 1-3 days per week. Overall she just feels frustrated and doesn't feel like her medications are really working for her.  Depression screen [facility] 2/9 12/22/2018 12/16/2018 10/15/2017 10/15/2017 08/20/2017  Decreased Interest 2 1 1 1 1   Down, Depressed, Hopeless 2 2 1 1 2   PHQ - 2 Score 4 3 2 2 3   Altered sleeping 0 1 2 2  0  Tired, decreased energy 1 1 0 0 2  Change in appetite 0 0 0 0 0  Feeling bad or failure about yourself  1 2 2 2 2   Trouble concentrating 2 2 2 2  0  Moving slowly or fidgety/restless 0 0 0 0 0  Suicidal  thoughts 0 0 0 0 0  PHQ-9 Score 8 9 8 8 7   Difficult doing work/chores - Somewhat difficult - Somewhat difficult Somewhat difficult  Some recent data might be hidden    GAD 7 : Generalized Anxiety Score 12/22/2018 12/16/2018 10/15/2017 10/15/2017  Nervous, Anxious, on Edge 3 3 3 3   Control/stop worrying 1 1 1 1   Worry too much - different things 1 1 2 2   Trouble relaxing 0 2 1 1   Restless 0 0 1 1  Easily annoyed or irritable 3 3 3 3   Afraid - awful might happen 1 1 0 0  Total GAD 7 Score 9 11 11 11   Anxiety Difficulty - Somewhat difficult - -      Past Medical History:  Diagnosis Date  . Anxiety   . Depression   . Hypertension    Past Surgical History:  Procedure Laterality Date  . CESAREAN SECTION  01/04/1999   Social History   Tobacco Use  . Smoking status: Never Smoker  . Smokeless tobacco: Never Used  Substance Use Topics  . Alcohol use: No   family history includes Asthma in her mother; Breast cancer in her  unknown relative; COPD in her mother; Diabetes in her unknown relative; Hyperlipidemia in her father; Hypertension in her father; Stroke in her unknown relative.   Review of Systems  Constitutional: Positive for malaise/fatigue.  Musculoskeletal: Positive for joint pain (bilateral hands).  Neurological: Positive for tremors. Negative for sensory change and focal weakness.  Psychiatric/Behavioral: Positive for depression. The patient is nervous/anxious.   All other systems reviewed and are negative.    Medications: Current Outpatient Medications  Medication Sig Dispense Refill  . Biotin (BIOTIN 5000) 5 MG CAPS Take by mouth.    . busPIRone (BUSPAR) 10 MG tablet Take 10 mg by mouth 2 (two) times daily.    . DULoxetine (CYMBALTA) 20 MG capsule 1 tab PO daily x 1 week, then 1 tab PO every other day x 1 week, then stop 10 capsule 0  . famotidine (PEPCID) 20 MG tablet Take 20 mg by mouth daily as needed.    . Norethindrone Acetate-Ethinyl Estrad-FE (BLISOVI 24 FE)  1-20 MG-MCG(24) tablet Take 1 tablet by mouth daily. 91 tablet 2  . polyethylene glycol (MIRALAX / GLYCOLAX) packet Take 17 g by mouth 2 (two) times daily. Until stooling regularly 30 packet 11  . Probiotic Product (PROBIOTIC-10 PO) Take by mouth.    . trimethoprim-polymyxin b (POLYTRIM) ophthalmic solution Place 1 drop into the left eye every 6 (six) hours. 10 mL 0  . Vitamin D, Ergocalciferol, (DRISDOL) 50000 units CAPS capsule TAKE 1 CAPSULE (50,000 UNITS TOTAL) BY MOUTH EVERY 7 (SEVEN) DAYS. 8 capsule 0  . vortioxetine HBr (TRINTELLIX) 5 MG TABS tablet Take 1 tablet (5 mg total) by mouth daily for 7 days, THEN 2 tablets (10 mg total) daily for 23 days. 60 tablet 0   No current facility-administered medications for this visit.    Allergies  Allergen Reactions  . Abilify [Aripiprazole]     Panic attacks when coupled with cymbalta       Objective:  BP 139/82   Pulse 78   Wt 137 lb (62.1 kg)   BMI 23.89 kg/m  Gen:  alert, not ill-appearing, no distress, appropriate for age Neuro: alert and oriented x 3, bilateral resting tremor L>R MSK: extremities atraumatic, normal gait and station Psych: appearance casual, cooperative, good eye contact, depressed mood, affect mood-congruent, speech is articulate, and thought processes clear and goal-directed, good insight, normal judgment, no SI   BP Readings from Last 3 Encounters:  01/04/19 139/82  12/22/18 122/79  12/16/18 132/71     No results found for this or any previous visit (from the past 72 hour(s)). No results found.    Assessment and Plan: 53 y.o. female with   .Rachael [name] was seen today for results.  Diagnoses and all orders for this visit:  Mild episode of recurrent major depressive disorder (HCC) -     vortioxetine HBr (TRINTELLIX) 5 MG TABS tablet; Take 1 tablet (5 mg total) by mouth daily for 7 days, THEN 2 tablets (10 mg total) daily for 23 days.  Benign essential tremor  Other orders -     DULoxetine  (CYMBALTA) 20 MG capsule; 1 tab PO daily x 1 week, then 1 tab PO every other day x 1 week, then stop   Reviewed Genesight test results with patient She was amenable to starting Trintellix. Discussed risks and benefits of medication. Begin Cymbalta taper today Start Trintellix 5 mg at the conclusion of the taper, option to self titrate to 10 mg after 1 week or remain at 5 mg if  there are side effects Okay to continue Buspar at current dose She was also provided with information on TMS therapy to consider should Trintellix not be effective  Discussed treatment for essential tremor is beta blocker. She deferred at this time  Patient education and anticipatory guidance given Patient agrees with treatment plan Follow-up in 1 month or sooner as needed if symptoms worsen or fail to improve  Levonne Hubertharley E. Cummings PA-C

## 2019-01-04 NOTE — Patient Instructions (Addendum)
Decrease Cymbalta to 20 mg daily x 1 week Then 20 mg every other day x 1 week Then stop Start Trintellix 5 mg daily for 1 week Option to increase to 10 mg daily

## 2019-01-15 ENCOUNTER — Encounter: Payer: Self-pay | Admitting: Physician Assistant

## 2019-01-15 NOTE — Progress Notes (Signed)
Negative for intraepithelial lesion or malignancy.  

## 2019-01-18 ENCOUNTER — Telehealth: Payer: Self-pay

## 2019-01-18 NOTE — Telephone Encounter (Signed)
Approvedtoday (Trintellix) Your PA request has been approved. Additional information will be provided in the approval communication. (Message 1145)  Pharmacy aware.

## 2019-01-18 NOTE — Telephone Encounter (Signed)
Pt is calling about the status of her PA for trintellix. Please advise. -EH/RMA

## 2019-02-01 ENCOUNTER — Other Ambulatory Visit: Payer: Self-pay

## 2019-02-01 ENCOUNTER — Encounter: Payer: Self-pay | Admitting: Physician Assistant

## 2019-02-01 ENCOUNTER — Ambulatory Visit: Payer: 59 | Admitting: Physician Assistant

## 2019-02-01 VITALS — BP 128/84 | HR 65 | Wt 136.0 lb

## 2019-02-01 DIAGNOSIS — F330 Major depressive disorder, recurrent, mild: Secondary | ICD-10-CM

## 2019-02-01 DIAGNOSIS — F410 Panic disorder [episodic paroxysmal anxiety] without agoraphobia: Secondary | ICD-10-CM

## 2019-02-01 DIAGNOSIS — R110 Nausea: Secondary | ICD-10-CM

## 2019-02-01 MED ORDER — ALPRAZOLAM 0.5 MG PO TABS: 0.5000 mg | ORAL_TABLET | Freq: Once | ORAL | 0 refills | Status: DC | PRN

## 2019-02-01 MED ORDER — VORTIOXETINE HBR 5 MG PO TABS: ORAL_TABLET | ORAL | 0 refills | Status: DC

## 2019-02-01 MED ORDER — HYDROXYZINE HCL 25 MG PO TABS: 25.0000 mg | ORAL_TABLET | Freq: Every day | ORAL | 0 refills | Status: DC

## 2019-02-01 MED ORDER — ONDANSETRON 4 MG PO TBDP: 4.0000 mg | ORAL_TABLET | Freq: Three times a day (TID) | ORAL | 0 refills | Status: DC | PRN

## 2019-02-01 NOTE — Progress Notes (Signed)
HPI:                                                                Rachael [name] is a 53 y.o. female who presents to [facility] Rachael [name]: Primary Care Sports Medicine today for genetic test results  Pleasant 53 yo F with longstanding history of treatment resistant depression and multiple medication failures most recently Cymbalta presents to review [employer]ight psychotropic test results.  She also complains of a chronic bilateral hand tremor that has been present for years, states it has been gradually worsening in the last year or 2 especially following exercise.  Tremors present at rest and with activities such as writing.  She has noticed a change in her handwriting which she attributes to the tremor.  She denies focal weakness, gait disturbance, falls.  She was evaluated by a different provider last May and Duloxetine was decreased from 20 mg bid to 30 mg QD, Buspar was scheduled at 10 mg twice a day. States she has been struggling for a few months with feeling overwhelmed, scattered, decreased concentration. States she feels more depressed in the morning. After her morning work-out, her mood improves. She exercises 5 days per week.  She notices more anxiety in the afternoons, especially palpitations/heart racing. States Buspirone in combination with breathing exercises is helpful for preventing a panic attack. She is taking an additional 10 mg dose of Buspirone prn 1-3 days per week. Overall she just feels frustrated and doesn't feel like her medications are really working for her.  02/01/2019 - she started a Trintellix-Cymbalta cross-taper on March 9th after completing Genesight testing - reports nausea with every dose of Trintellix. Increased from  to 10 mg last week and nausea became more severe - has been off of Cymbalta for about 2 weeks. Reports withdrawal symptoms including myalgia and brain fog - had a panic attack last night around 2 am. Reports this was the worse once  she had in a long while. Did not respond to Buspar. She got up and baked banana bread, which was helpful.  Depression screen [facility] 2/9 02/01/2019 12/22/2018 12/16/2018 10/15/2017 10/15/2017  Decreased Interest Down, Depressed, Hopeless PHQ - 2 Score Altered sleeping 3 0 Tired, decreased energy 0 0  Change in appetite 2 0 0 0 0  Feeling bad or failure about yourself  Trouble concentrating Moving slowly or fidgety/restless 0 0 0 0 0  Suicidal thoughts 0 0 0 0 0  PHQ-9 Score Difficult doing work/chores Very difficult - Somewhat difficult - Somewhat difficult  Some recent data might be hidden    GAD 7 : Generalized Anxiety Score 02/01/2019 12/22/2018 12/16/2018 10/15/2017  Nervous, Anxious, on Edge Control/stop worrying Worry too much - different things Trouble relaxing 3 0 2 1  Restless 0 0 0 1  Easily annoyed or irritable Afraid - awful might happen 0  Total GAD 7 Score 16  9 11 11   Anxiety Difficulty - - Somewhat difficult -      Past Medical History:  Diagnosis Date  . Anxiety   . Depression   . Hypertension    Past Surgical History:  Procedure Laterality Date  . CESAREAN SECTION  01/04/1999   Social History   Tobacco Use  . Smoking status: Never Smoker  . Smokeless tobacco: Never Used  Substance Use Topics  . Alcohol use: No   family history includes Asthma in her mother; Breast cancer in her unknown relative; COPD in her mother; Diabetes in her unknown relative; Hyperlipidemia in her father; Hypertension in her father; Stroke in her unknown relative.   Review of Systems  Constitutional: Positive for malaise/fatigue.  Musculoskeletal: Positive for joint pain (bilateral hands).  Neurological: Positive for tremors. Negative for sensory change and focal weakness.  Psychiatric/Behavioral: Positive for depression. The patient is nervous/anxious.   All other  systems reviewed and are negative.    Medications: Current Outpatient Medications  Medication Sig Dispense Refill  . ALPRAZolam (XANAX) 0.5 MG tablet Take 1 tablet (0.5 mg total) by mouth once as needed for up to 1 dose for anxiety (panic attack). 5 tablet 0  . Biotin (BIOTIN 5000) 5 MG CAPS Take by mouth.    . busPIRone (BUSPAR) 10 MG tablet Take 10 mg by mouth 2 (two) times daily. May take an additional 10 mg prn    . famotidine (PEPCID) 20 MG tablet Take 20 mg by mouth daily as needed.    . hydrOXYzine (ATARAX/VISTARIL) 25 MG tablet Take 1 tablet (25 mg total) by mouth at bedtime. 30 tablet 0  . Norethindrone Acetate-Ethinyl Estrad-FE (BLISOVI 24 FE) 1-20 MG-MCG(24) tablet Take 1 tablet by mouth daily. 91 tablet 2  . ondansetron (ZOFRAN-ODT) 4 MG disintegrating tablet Take 1 tablet (4 mg total) by mouth every 8 (eight) hours as needed for nausea or vomiting. 10 tablet 0  . polyethylene glycol (MIRALAX / GLYCOLAX) packet Take 17 g by mouth 2 (two) times daily. Until stooling regularly 30 packet 11  . Probiotic Product (PROBIOTIC-10 PO) Take by mouth.    . vortioxetine HBr (TRINTELLIX) 5 MG TABS tablet Take 1 tablet (5 mg total) by mouth daily for 7 days, THEN 2 tablets (10 mg total) daily for 23 days. 60 tablet 0   No current facility-administered medications for this visit.    Allergies  Allergen Reactions  . Abilify [Aripiprazole]     Panic attacks when coupled with cymbalta       Objective:  BP 128/84   Pulse 65   Wt 136 lb (61.7 kg)   BMI 23.71 kg/m  Gen:  alert, not ill-appearing, no distress, appropriate for age Neuro: alert and oriented x 3, bilateral resting tremor L>R MSK: extremities atraumatic, normal gait and station Psych: appearance casual, cooperative, good eye contact, depressed mood, affect mood-congruent, speech is articulate, and thought processes clear and goal-directed, good insight, normal judgment, no SI     Assessment and Plan: 53 y.o. female  with   .Denai was seen today for medication management.  Diagnoses and all orders for this visit:  Nausea without vomiting -     ondansetron (ZOFRAN-ODT) 4 MG disintegrating tablet; Take 1 tablet (4 mg total) by mouth every 8 (eight) hours as needed for nausea or vomiting.  Mild episode of recurrent major depressive disorder (HCC) -     vortioxetine HBr (TRINTELLIX) 5 MG TABS tablet; Take 1 tablet (5 mg total) by mouth daily  for 7 days, THEN 2 tablets (10 mg total) daily for 23 days.  Panic disorder -     hydrOXYzine (ATARAX/VISTARIL) 25 MG tablet; Take 1 tablet (25 mg total) by mouth at bedtime. -     ALPRAZolam (XANAX) 0.5 MG tablet; Take 1 tablet (0.5 mg total) by mouth once as needed for up to 1 dose for anxiety (panic attack).  PHQ9=19, no acute safety issues GAD7=16 I am hopeful that nausea is dose-related and will resolve with time. Recommend she reduce Trintellix to 5 mg for the next 1-2 weeks or until nausea resolves. Also provided with Zofran 4 mg odt as needed I am also hopeful that the uptick in anxiety symptoms is due to mild cymbalta withdrawal Alprazolam 0.5 mg prn for panic attacks Continue Buspar at current dose Adding Hydroxyzine 25 mg QHS for panic/anxiety Consider increasing Buspar Also consider TMS therapy to consider should Trintellix not be effective    Patient education and anticipatory guidance given Patient agrees with treatment plan Follow-up in 1 week or sooner as needed if symptoms worsen or fail to improve  Levonne Hubert PA-C

## 2019-02-07 DIAGNOSIS — F410 Panic disorder [episodic paroxysmal anxiety] without agoraphobia: Secondary | ICD-10-CM | POA: Insufficient documentation

## 2019-02-08 ENCOUNTER — Ambulatory Visit (INDEPENDENT_AMBULATORY_CARE_PROVIDER_SITE_OTHER): Payer: 59 | Admitting: Physician Assistant

## 2019-02-08 ENCOUNTER — Other Ambulatory Visit: Payer: Self-pay

## 2019-02-08 DIAGNOSIS — F330 Major depressive disorder, recurrent, mild: Secondary | ICD-10-CM | POA: Diagnosis not present

## 2019-02-08 DIAGNOSIS — F410 Panic disorder [episodic paroxysmal anxiety] without agoraphobia: Secondary | ICD-10-CM

## 2019-02-08 MED ORDER — BUSPIRONE HCL 10 MG PO TABS: 10.0000 mg | ORAL_TABLET | Freq: Three times a day (TID) | ORAL | 1 refills | Status: DC

## 2019-02-08 MED ORDER — VORTIOXETINE HBR 10 MG PO TABS: 10.0000 mg | ORAL_TABLET | Freq: Every day | ORAL | 0 refills | Status: DC

## 2019-02-08 MED ORDER — HYDROXYZINE HCL 25 MG PO TABS: 25.0000 mg | ORAL_TABLET | Freq: Every day | ORAL | 0 refills | Status: DC

## 2019-02-08 NOTE — Progress Notes (Signed)
Virtual Visit via Telephone Note  I connected with Rachael [name] on 02/10/19 at 11:10 AM EDT by telephone and verified that I am speaking with the correct person using two identifiers.   I discussed the limitations, risks, security and privacy concerns of performing an evaluation and management service by telephone and the availability of in person appointments. I also discussed with the patient that there may be a patient responsible charge related to this service. The patient expressed understanding and agreed to proceed.  HPI:                                                                 Pleasant 53 yo F with longstanding history of treatment resistant depression and multiple medication failures most recently Cymbalta   02/01/2019 - she started a Trintellix-Cymbalta cross-taper on March 9th after completing [facility] testing - reports nausea with every dose of Trintellix. Increased from 5mg  to 10 mg last week and nausea became more severe - has been off of Cymbalta for about 2 weeks. Reports withdrawal symptoms including myalgia and brain fog - had a panic attack last night around 2 am. Reports this was the worse once she had in a long while. Did not respond to Buspar. She got up and baked banana bread, which was helpful.  02/08/2019 - at last OV Trintellix was decreased back to 5 mg; Hydroxyzine 25 mg was added for nighttime anxiety/restlessness and low-dose Alprazolam was re-started prn for panic attacks - yesterday she increased Trintellix from 5 to 10 mg and has had mild nausea, but overall nausea is tolerable compared to before - still struggling a lot with depression and anxiety symptoms - endorses feeling restless, fidgety, short of breath especially in the evenings. She has had to take Alprazolam twice for these mild panic attacks  [facility]2019 02/01/2019 12/22/2018 12/16/2018 10/15/2017  Decreased Interest 3 3 2 1 1   Down, Depressed, Hopeless 3 3 2 2 1   PHQ -  2 Score 6 6 4 3 2   Altered sleeping 2 3 0 1 2  Tired, decreased energy 3 2 1 1  0  Change in appetite 2 2 0 0 0  Feeling bad or failure about yourself  3 3 1 2 2   Trouble concentrating 3 3 2 2 2   Moving slowly or fidgety/restless 2 0 0 0 0  Suicidal thoughts 0 0 0 0 0  PHQ-9 Score 21 19 8 9 8   Difficult doing work/chores Very difficult Very difficult - Somewhat difficult -  Some recent data might be hidden    GAD 7 : Generalized Anxiety Score 02/01/2019 12/22/2018 12/16/2018 10/15/2017  Nervous, Anxious, on Edge 3 3 3 3   Control/stop worrying 2 1 1 1   Worry too much - different things 3 1 1 2   Trouble relaxing 3 0 2 1  Restless 0 0 0 1  Easily annoyed or irritable 3 3 3 3   Afraid - awful might happen 2 1 1  0  Total GAD 7 Score 16 9 11 11   Anxiety Difficulty - - Somewhat difficult -      Past Medical History:  Diagnosis Date  . Anxiety   . Depression   . Hypertension    Past Surgical History:  Procedure Laterality Date  .  CESAREAN SECTION  01/04/1999   Social History   Tobacco Use  . Smoking status: Never Smoker  . Smokeless tobacco: Never Used  Substance Use Topics  . Alcohol use: No   family history includes Asthma in her mother; Breast cancer in her unknown relative; COPD in her mother; Diabetes in her unknown relative; Hyperlipidemia in her father; Hypertension in her father; Stroke in her unknown relative.    ROS: negative except as noted in the HPI  Medications: Current Outpatient Medications  Medication Sig Dispense Refill  . ALPRAZolam (XANAX) 0.5 MG tablet Take 1 tablet (0.5 mg total) by mouth once as needed for up to 1 dose for anxiety (panic attack). 5 tablet 0  . Biotin (BIOTIN 5000) 5 MG CAPS Take by mouth.    . busPIRone (BUSPAR) 10 MG tablet Take 1 tablet (10 mg total) by mouth 3 (three) times daily. May take an additional 10 mg prn 270 tablet 1  . famotidine (PEPCID) 20 MG tablet Take 20 mg by mouth daily as needed.    . Norethindrone Acetate-Ethinyl  Estrad-FE (BLISOVI 24 FE) 1-20 MG-MCG(24) tablet Take 1 tablet by mouth daily. 91 tablet 2  . ondansetron (ZOFRAN-ODT) 4 MG disintegrating tablet Take 1 tablet (4 mg total) by mouth every 8 (eight) hours as needed for nausea or vomiting. 10 tablet 0  . polyethylene glycol (MIRALAX / GLYCOLAX) packet Take 17 g by mouth 2 (two) times daily. Until stooling regularly 30 packet 11  . Probiotic Product (PROBIOTIC-10 PO) Take by mouth.    . vortioxetine HBr (TRINTELLIX) 10 MG TABS tablet Take 1 tablet (10 mg total) by mouth daily. 30 tablet 0  . hydrOXYzine (ATARAX/VISTARIL) 25 MG tablet Take 1 tablet (25 mg total) by mouth at bedtime. 90 tablet 0   No current facility-administered medications for this visit.    Allergies  Allergen Reactions  . Abilify [Aripiprazole]     Panic attacks when coupled with cymbalta       Objective:  There were no vitals taken for this visit. Psych: depressed mood, speech is articulate, normal volume and rate, thought processes clear and goal-directed, normal judgment, good insight, no SI    No results found for this or any previous visit (from the past 72 hour(s)). No results found.    Assessment and Plan: 53 y.o. female with   .Diagnoses and all orders for this visit:  Panic disorder -     busPIRone (BUSPAR) 10 MG tablet; Take 1 tablet (10 mg total) by mouth 3 (three) times daily. May take an additional 10 mg prn -     hydrOXYzine (ATARAX/VISTARIL) 25 MG tablet; Take 1 tablet (25 mg total) by mouth at bedtime.  Mild episode of recurrent major depressive disorder (HCC) -     vortioxetine HBr (TRINTELLIX) 10 MG TABS tablet; Take 1 tablet (10 mg total) by mouth daily.   PHQ9 unchanged, no acute safety issues It has been a rocky transition from Cymbalta to Trintellix, but overall Palma is coping well and being patient I am hopeful of a partial response to 10 mg dose of Trintellix over the next 2 weeks. If not, then we could consider transitioning back  to Cymbalta and arranging TMS therapy Cont Trintellix 10 mg Increase Buspar to 10 mg tid Cont Hydroxyzine 25 mg QHS Follow-up in 2 weeks   I discussed the assessment and treatment plan with the patient. The patient was provided an opportunity to ask questions and all were answered. The patient agreed  with the plan and demonstrated an understanding of the instructions.   The patient was advised to call back or seek an in-person evaluation if the symptoms worsen or if the condition fails to improve as anticipated.  I provided 5-10 minutes of non-face-to-face time during this encounter.   Carlis Stable, [place]

## 2019-02-10 ENCOUNTER — Encounter: Payer: Self-pay | Admitting: Physician Assistant

## 2019-02-10 MED ORDER — BUSPIRONE HCL 10 MG PO TABS: 10.0000 mg | ORAL_TABLET | Freq: Three times a day (TID) | ORAL | 1 refills | Status: DC

## 2019-02-19 ENCOUNTER — Encounter: Payer: Self-pay | Admitting: Physician Assistant

## 2019-02-19 ENCOUNTER — Other Ambulatory Visit: Payer: Self-pay | Admitting: Physician Assistant

## 2019-02-19 DIAGNOSIS — F410 Panic disorder [episodic paroxysmal anxiety] without agoraphobia: Secondary | ICD-10-CM

## 2019-02-20 ENCOUNTER — Other Ambulatory Visit: Payer: Self-pay | Admitting: Physician Assistant

## 2019-02-20 DIAGNOSIS — F410 Panic disorder [episodic paroxysmal anxiety] without agoraphobia: Secondary | ICD-10-CM

## 2019-02-22 ENCOUNTER — Ambulatory Visit (INDEPENDENT_AMBULATORY_CARE_PROVIDER_SITE_OTHER): Payer: 59 | Admitting: Physician Assistant

## 2019-02-22 VITALS — BP 113/76

## 2019-02-22 DIAGNOSIS — F410 Panic disorder [episodic paroxysmal anxiety] without agoraphobia: Secondary | ICD-10-CM

## 2019-02-22 DIAGNOSIS — F330 Major depressive disorder, recurrent, mild: Secondary | ICD-10-CM

## 2019-02-22 DIAGNOSIS — F339 Major depressive disorder, recurrent, unspecified: Secondary | ICD-10-CM

## 2019-02-22 MED ORDER — ALPRAZOLAM 0.5 MG PO TABS: 0.5000 mg | ORAL_TABLET | Freq: Once | ORAL | 0 refills | Status: DC | PRN

## 2019-02-22 MED ORDER — DULOXETINE HCL 20 MG PO CPEP: 20.0000 mg | ORAL_CAPSULE | Freq: Two times a day (BID) | ORAL | 0 refills | Status: DC

## 2019-02-22 NOTE — Progress Notes (Signed)
Virtual Visit via Telephone Note  I connected with Rachael [name] on 02/22/19 at 11:10 AM EDT by telephone and verified that I am speaking with the correct person using two identifiers.   I discussed the limitations, risks, security and privacy concerns of performing an evaluation and management service by telephone and the availability of in person appointments. I also discussed with the patient that there may be a patient responsible charge related to this service. The patient expressed understanding and agreed to proceed.   History of Present Illness:  Pleasant 53 yo F with longstanding history of treatment resistant depression and multiple medication failures most recently Cymbalta   02/01/2019 - she started a Trintellix-Cymbalta cross-taper on March 9th after completing [facility] testing - reports nausea with every dose of Trintellix. Increased from 5mg  to 10 mg last week and nausea became more severe - has been off of Cymbalta for about 2 weeks. Reports withdrawal symptoms including myalgia and brain fog - had a panic attack last night around 2 am. Reports this was the worse once she had in a long while.Did not respond to Buspar.She got up and baked banana bread, which was helpful.  02/08/2019 - at last OV Trintellix was decreased back to 5 mg; Hydroxyzine 25 mg was added for nighttime anxiety/restlessness and low-dose Alprazolam was re-started prn for panic attacks - yesterday she increased Trintellix from 5 to 10 mg and has had mild nausea, but overall nausea is tolerable compared to before - still struggling a lot with depression and anxiety symptoms - endorses feeling restless, fidgety, short of breath especially in the evenings. She has had to take Alprazolam twice for these mild panic attacks  02/22/2019 - reports she was having severe anxiety and nightly panic attacks for the last 4 days that have been making daily life a struggle - she decreased Trintellix back to 5 mg 2  days ago due to persistent nausea - she unfortunately ran out of Alprazolam and has had to ride out the panic attacks. States she did self-medicate with a glass of wine one evening because she didn't know what else she could do   Depression screen [facility] 2/9 02/22/2019 02/08/2019 02/01/2019 12/22/2018 12/16/2018  Decreased Interest 1 3 3 2 1   Down, Depressed, Hopeless 3 3 3 2 2   PHQ - 2 Score 4 6 6 4 3   Altered sleeping 2 2 3  0 1  Tired, decreased energy 2 3 2 1 1   Change in appetite 1 2 2  0 0  Feeling bad or failure about yourself  2 3 3 1 2   Trouble concentrating 2 3 3 2 2   Moving slowly or fidgety/restless 0 2 0 0 0  Suicidal thoughts 0 0 0 0 0  PHQ-9 Score 13 21 19 8 9   Difficult doing work/chores - Very difficult Very difficult - Somewhat difficult  Some recent data might be hidden    GAD 7 : Generalized Anxiety Score 02/22/2019 02/01/2019 12/22/2018 12/16/2018  Nervous, Anxious, on Edge 3 3 3 3   Control/stop worrying 2 2 1 1   Worry too much - different things 3 3 1 1   Trouble relaxing 2 3 0 2  Restless 2 0 0 0  Easily annoyed or irritable 3 3 3 3   Afraid - awful might happen 2 2 1 1   Total GAD 7 Score 17 16 9 11   Anxiety Difficulty - - - Somewhat difficult      Past Medical History:  Diagnosis Date  . Anxiety   .  Depression   . Hypertension    Past Surgical History:  Procedure Laterality Date  . CESAREAN SECTION  01/04/1999   Social History   Tobacco Use  . Smoking status: Never Smoker  . Smokeless tobacco: Never Used  Substance Use Topics  . Alcohol use: No   family history includes Asthma in her mother; Breast cancer in her unknown relative; COPD in her mother; Diabetes in her unknown relative; Hyperlipidemia in her father; Hypertension in her father; Stroke in her unknown relative.    ROS: negative except as noted in the HPI  Medications: Current Outpatient Medications  Medication Sig Dispense Refill  . ALPRAZolam (XANAX) 0.5 MG tablet Take 1 tablet (0.5 mg  total) by mouth once as needed for up to 1 dose for anxiety (panic attack). 5 tablet 0  . Biotin (BIOTIN 5000) 5 MG CAPS Take by mouth.    . busPIRone (BUSPAR) 10 MG tablet Take 1 tablet (10 mg total) by mouth 3 (three) times daily. 270 tablet 1  . famotidine (PEPCID) 20 MG tablet Take 20 mg by mouth daily as needed.    . hydrOXYzine (ATARAX/VISTARIL) 25 MG tablet Take 1 tablet (25 mg total) by mouth at bedtime. 90 tablet 0  . Norethindrone Acetate-Ethinyl Estrad-FE (BLISOVI 24 FE) 1-20 MG-MCG(24) tablet Take 1 tablet by mouth daily. 91 tablet 2  . ondansetron (ZOFRAN-ODT) 4 MG disintegrating tablet Take 1 tablet (4 mg total) by mouth every 8 (eight) hours as needed for nausea or vomiting. 10 tablet 0  . polyethylene glycol (MIRALAX / GLYCOLAX) packet Take 17 g by mouth 2 (two) times daily. Until stooling regularly 30 packet 11  . Probiotic Product (PROBIOTIC-10 PO) Take by mouth.    . vortioxetine HBr (TRINTELLIX) 10 MG TABS tablet Take 1 tablet (10 mg total) by mouth daily. 30 tablet 0   No current facility-administered medications for this visit.    Allergies  Allergen Reactions  . Abilify [Aripiprazole]     Panic attacks when coupled with cymbalta       Objective:  BP 113/76  Neuro: alert and oriented x 3 Psych: cooperative, euthymic mood, affect mood-congruent, speech is articulate, normal rate and volume; thought processes clear and goal-directed, normal judgment, good insight   No results found for this or any previous visit (from the past 72 hour(s)). No results found.    Assessment and Plan: 53 y.o. female with   .Diagnoses and all orders for this visit:  Mild episode of recurrent major depressive disorder (HCC) -     Ambulatory referral to Psychology  Panic disorder -     ALPRAZolam (XANAX) 0.5 MG tablet; Take 1 tablet (0.5 mg total) by mouth once as needed for up to 1 dose for anxiety (panic attack).  Recurrent major depression resistant to treatment [facility]) -      Ambulatory referral to Psychology  Other orders -     DULoxetine (CYMBALTA) 20 MG capsule; Take 1 capsule (20 mg total) by mouth 2 (two) times daily.   Unfortunately Nana's trial of Trintellix was not successful. She has had worsening of both her depressive symptoms and severe anxiety with panic attacks. She has had to increase her Buspar dose and re-start Alprazolam as a result. PHQ9 score on Cymbalta=8, PHQ9 on Trintellix=13. GAD7 score on Cymbalta=9, Trintellix=17 It appears that anxiety was fairly well controlled on Cymbalta so we will switch her back to this As far as treatment resistant depression, referring for TMS therapy Start Duloxetine 20 mg x  1 week, then 20 mg bid Continue Trintellix 5 mg for 2-3 days, then stop Cont Buspar 10 mg tid Cont Alprazolam 0.5 mg once prn for panic Follow-up in 4-6 weeks   Follow Up Instructions:    I discussed the assessment and treatment plan with the patient. The patient was provided an opportunity to ask questions and all were answered. The patient agreed with the plan and demonstrated an understanding of the instructions.   The patient was advised to call back or seek an in-person evaluation if the symptoms worsen or if the condition fails to improve as anticipated.  I provided 11-20 minutes of non-face-to-face time during this encounter.   Carlis Stableharley [place] , [place]

## 2019-02-24 ENCOUNTER — Encounter: Payer: Self-pay | Admitting: Physician Assistant

## 2019-02-24 DIAGNOSIS — F339 Major depressive disorder, recurrent, unspecified: Secondary | ICD-10-CM | POA: Insufficient documentation

## 2019-03-02 ENCOUNTER — Other Ambulatory Visit: Payer: Self-pay | Admitting: Physician Assistant

## 2019-03-02 DIAGNOSIS — F330 Major depressive disorder, recurrent, mild: Secondary | ICD-10-CM

## 2019-05-13 ENCOUNTER — Ambulatory Visit (INDEPENDENT_AMBULATORY_CARE_PROVIDER_SITE_OTHER): Payer: 59 | Admitting: Psychiatry

## 2019-05-13 ENCOUNTER — Other Ambulatory Visit: Payer: Self-pay

## 2019-05-13 ENCOUNTER — Encounter (HOSPITAL_COMMUNITY): Payer: Self-pay | Admitting: Psychiatry

## 2019-05-13 DIAGNOSIS — F339 Major depressive disorder, recurrent, unspecified: Secondary | ICD-10-CM

## 2019-05-13 DIAGNOSIS — F410 Panic disorder [episodic paroxysmal anxiety] without agoraphobia: Secondary | ICD-10-CM | POA: Diagnosis not present

## 2019-05-13 MED ORDER — BUSPIRONE HCL 10 MG PO TABS: 10.0000 mg | ORAL_TABLET | Freq: Three times a day (TID) | ORAL | 1 refills | Status: DC

## 2019-05-13 MED ORDER — DULOXETINE HCL 30 MG PO CPEP: 30.0000 mg | ORAL_CAPSULE | Freq: Two times a day (BID) | ORAL | 0 refills | Status: DC

## 2019-05-13 MED ORDER — ALPRAZOLAM 0.5 MG PO TABS: 0.5000 mg | ORAL_TABLET | Freq: Once | ORAL | 0 refills | Status: DC | PRN

## 2019-05-13 NOTE — Progress Notes (Signed)
Psychiatric Initial Adult Assessment   Patient Identification: Rachael MayersSheila Papp MRN:  [id number] Date of Evaluation:  05/13/2019 Referral Source: PCP Chief Complaint:   Chief Complaint    Depression; Establish Care; TMS Consult     Visit Diagnosis:    ICD-10-CM   1. Recurrent major depression resistant to treatment (HCC)  F33.9   2. Panic disorder  F41.0 ALPRAZolam (XANAX) 0.5 MG tablet    busPIRone (BUSPAR) 10 MG tablet    History of Present Illness: Patient is a 53 year old female referred by her PCP for psychiatric evaluation for TMS as patient has been struggling with depression on and off for 25 years.  Patient reports that she has been depressed now for 25 years, as that she did better during her pregnancies, reports her youngest child is in the fifth grade.  Patient states that her depression has never remitted, medications help some but adds that she continues to struggle with feeling hopeless, fatigue, sleep problems, not really enjoying activities, struggling with increased anxiety, feeling overwhelmed, feeling that she is not good enough.  Patient has that she has never had thoughts of hurting herself, ending her life, hurting others and denies any psychotic symptoms.  On a scale of 0-10, with 0 being no symptoms and 10 being the worst, patient reports that her depression stays a 7 out of 10 and on the same scale her anxiety stays a 8 or 9 out of 10.  She has that she does have a history of panic disorder, but reports that her anxiety has worsened since COVID along with her depression.  Patient adds that her PCP tried her on Trintellix, adds that it made her feel jittery and she is back on her Cymbalta times is currently taking Cymbalta 20 mg twice daily along with BuSpar 10 mg twice daily and Xanax 0.5 mg as needed.  Patient states that she barely uses Xanax as she is afraid of being addicted to it.  Patient has that she does not have any stressors that worsen her depression but does  add that COVID has worsened her anxiety.  She has that she worries about her family, states that her husband is supportive but adds that he does not understand why she is depressed.  She also reports that she has her middle child who has autism, states that he is pretty functional.  She has a 51 year old daughter who is at college but is home now because of COVID and reports that she is really helpful.  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  She reports that even exercising does not help her with her fatigue, problems with concentration, her depression.  Patient denies any substance use, illicit drug use, any tobacco use.  Associated Signs/Symptoms: Depression Symptoms:  depressed mood, psychomotor agitation, fatigue, feelings of worthlessness/guilt, difficulty concentrating, hopelessness, anxiety, panic attacks, loss of energy/fatigue, (Hypo) Manic Symptoms:  Distractibility, Anxiety Symptoms:  Excessive Worry, Panic Symptoms, Obsessive Compulsive Symptoms:   None,, Psychotic Symptoms:  Hallucinations: None PTSD Symptoms: Negative  Past Psychiatric History: Patient has no history of psychiatric hospitalization, has been tried on Paxil, Wellbutrin, Zoloft, Abilify, Trintellix and is currently on Cymbalta along with BuSpar and Xanax  Previous Psychotropic Medications: Yes   Substance Abuse History in the last 12 months:  No.  Consequences of Substance Abuse: Negative  Past Medical History:  Past Medical History:  Diagnosis Date  . Anxiety   . Depression   . Hypertension  Past Surgical History:  Procedure Laterality Date  . CESAREAN SECTION  01/04/1999    Family Psychiatric History: Patient denies any family psychiatric history, does have a son with autism highly functioning  Family History:  Family History  Problem Relation Age of Onset  . Breast cancer Unknown        aunts  . Diabetes Unknown        grandmother   . Stroke  Unknown        grandmother   . Hypertension Father   . Hyperlipidemia Father   . Asthma Mother   . COPD Mother     Social History:   Social History   Socioeconomic History  . Marital status: Married    Spouse name: Not on file  . Number of children: Not on file  . Years of education: Not on file  . Highest education level: Not on file  Occupational History  . Not on file  Social Needs  . Financial resource strain: Not on file  . Food insecurity    Worry: Not on file    Inability: Not on file  . Transportation needs    Medical: Not on file    Non-medical: Not on file  Tobacco Use  . Smoking status: Never Smoker  . Smokeless tobacco: Never Used  Substance and Sexual Activity  . Alcohol use: No  . Drug use: No  . Sexual activity: Yes    Partners: Male    Birth control/protection: None  Lifestyle  . Physical activity    Days per week: Not on file    Minutes per session: Not on file  . Stress: Not on file  Relationships  . Social Musicianconnections    Talks on phone: Not on file    Gets together: Not on file    Attends religious service: Not on file    Active member of club or organization: Not on file    Attends meetings of clubs or organizations: Not on file    Relationship status: Not on file  Other Topics Concern  . Not on file  Social History Narrative  . 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 Abilify [Aripiprazole]     Panic attacks when coupled with cymbalta    Metabolic Disorder Labs: No results found for: HGBA1C, MPG No results found for: PROLACTIN Lab Results  Component Value Date   CHOL 202 (H) 09/09/2016   TRIG 79 09/09/2016   HDL 49 09/09/2016   CHOLHDL 4.1 09/09/2016   VLDL 16 09/09/2016   LDLCALC 137 (H) 09/09/2016   Lab Results  Component Value Date   TSH 1.27 09/09/2016    Therapeutic Level Labs: No results  found for: LITHIUM No results found for: CBMZ No results found for: VALPROATE  Current Medications: Current Outpatient Medications  Medication Sig Dispense Refill  . ALPRAZolam (XANAX) 0.5 MG tablet Take 1 tablet (0.5 mg total) by mouth once as needed for up to 1 dose for anxiety (panic attack). 20 tablet 0  . Biotin (BIOTIN 5000) 5 MG CAPS Take by mouth.    . busPIRone (BUSPAR) 10 MG tablet Take 1 tablet (10 mg total) by mouth 3 (three) times daily. 270 tablet 1  . DULoxetine (CYMBALTA) 20 MG capsule Take 1 capsule (20 mg total) by mouth 2 (two) times daily. 180 capsule 0  .  famotidine (PEPCID) 20 MG tablet Take 20 mg by mouth daily as needed.    . hydrOXYzine (ATARAX/VISTARIL) 25 MG tablet Take 1 tablet (25 mg total) by mouth at bedtime. 90 tablet 0  . Norethindrone Acetate-Ethinyl Estrad-FE (BLISOVI 24 FE) 1-20 MG-MCG(24) tablet Take 1 tablet by mouth daily. 91 tablet 2  . ondansetron (ZOFRAN-ODT) 4 MG disintegrating tablet Take 1 tablet (4 mg total) by mouth every 8 (eight) hours as needed for nausea or vomiting. 10 tablet 0  . polyethylene glycol (MIRALAX / GLYCOLAX) packet Take 17 g by mouth 2 (two) times daily. Until stooling regularly 30 packet 11  . Probiotic Product (PROBIOTIC-10 PO) Take by mouth.     No current facility-administered medications for this visit.     Musculoskeletal: Strength & Muscle Tone: within normal limits Gait & Station: normal Patient leans: N/A  Psychiatric Specialty Exam: Review of Systems  Constitutional: Positive for malaise/fatigue. Negative for fever.  HENT: Negative.  Negative for congestion, hearing loss and sore throat.   Eyes: Negative.  Negative for blurred vision, discharge and redness.  Respiratory: Negative.  Negative for cough, shortness of breath and wheezing.   Gastrointestinal: Negative.  Negative for abdominal pain, constipation, diarrhea, heartburn, nausea and vomiting.  Musculoskeletal: Negative.  Negative for falls, joint pain  and myalgias.  Skin: Negative.  Negative for rash.  Neurological: Negative.  Negative for dizziness, tingling, seizures, loss of consciousness and headaches.  Endo/Heme/Allergies: Negative.  Negative for environmental allergies.  Psychiatric/Behavioral: Positive for depression. Negative for hallucinations, substance abuse and suicidal ideas. The patient is nervous/anxious and has insomnia.     There were no vitals taken for this visit.There is no height or weight on file to calculate BMI.  General Appearance: Casual  Eye Contact:  Good  Speech:  Clear and Coherent and Normal Rate  Volume:  Normal  Mood:  Anxious and Depressed  Affect:  Congruent and Full Range  Thought Process:  Coherent, Goal Directed and Descriptions of Associations: Intact  Orientation:  Full (Time, Place, and Person)  Thought Content:  Logical, Hallucinations: None and Rumination  Suicidal Thoughts:  No  Homicidal Thoughts:  No  Memory:  Immediate;   Fair Recent;   Fair Remote;   Fair  Judgement:  Intact  Insight:  Present  Psychomotor Activity:  Normal  Concentration:  Concentration: Fair and Attention Span: Fair  Recall:  [employer] [place]: Fair  Akathisia:  No  Handed:  Right  AIMS (if indicated):  not done  Assets:  Communication Skills Desire for [place] Talents/Skills Transportation  ADL's:  Intact  Cognition: WNL  Sleep:  Poor   Screenings: GAD-7     Office Visit from 02/22/2019 in [facility] Office Visit from 02/01/2019 in [place] Office Visit from 12/22/2018 in [facility] Office Visit from 12/16/2018 in [place] Visit from 10/15/2017 in [place]  Total GAD-7 Score  17  16  9  11  11     PHQ2-9     Office Visit from  02/22/2019 in [place] Office Visit from 02/08/2019 in [place] Office Visit from 02/01/2019 in [place] Office Visit from 12/22/2018 in [place]  [facility]  PHQ-2 Total Score  4  6  6  4  3   PHQ-9 Total Score  13  21  19  8  9       Assessment and Plan: 3 Cymbalta to 30 mg twice daily for depression and anxiety To continue with take BuSpar at 10 mg 3 times daily for anxiety To continue Xanax 0.5 mg as needed daily for panic attacks Discussed the risks and benefits of TMS with patient in length at this visit, patient would benefit as she has had continued depression with multiple trials of psychotropic medication, reports her depression has never remitted, currently PHQ 9 is a 17, and that it interferes with her day-to-day functioning. Patient to start seeing a therapist at [place] office to help her with her coping skills and her anxiety Call as needed 50% of this visit was spent in discussing depression, etiology, responsive patient to medications along with coping skills for anxiety, the current cope with situation, having a child with special needs, her relationship with her husband and the need for her to have therapy along with discussing medication options, TMS and ECT. This visit was a 50-minute visit   Nelly RoutArchana Mellany Dinsmore, MD 7/2/202011:36 AM

## 2019-05-16 ENCOUNTER — Other Ambulatory Visit: Payer: Self-pay | Admitting: Physician Assistant

## 2019-05-25 ENCOUNTER — Ambulatory Visit (INDEPENDENT_AMBULATORY_CARE_PROVIDER_SITE_OTHER): Payer: 59 | Admitting: Licensed Clinical Social Worker

## 2019-05-25 DIAGNOSIS — F339 Major depressive disorder, recurrent, unspecified: Secondary | ICD-10-CM | POA: Diagnosis not present

## 2019-05-25 DIAGNOSIS — F410 Panic disorder [episodic paroxysmal anxiety] without agoraphobia: Secondary | ICD-10-CM

## 2019-05-25 DIAGNOSIS — F411 Generalized anxiety disorder: Secondary | ICD-10-CM | POA: Diagnosis not present

## 2019-06-01 NOTE — Progress Notes (Signed)
Virtual Visit via Telephone Note  I connected with Rachael [name] on 06/01/19 at 12:30 PM EDT by telephone and verified that I am speaking with the correct person using two identifiers.  Location: Patient: Home Provider: Office   I discussed the limitations, risks, security and privacy concerns of performing an evaluation and management service by telephone and the availability of in person appointments. I also discussed with the patient that there may be a patient responsible charge related to this service. The patient expressed understanding and agreed to proceed.     I discussed the assessment and treatment plan with the patient. The patient was provided an opportunity to ask questions and all were answered. The patient agreed with the plan and demonstrated an understanding of the instructions.   The patient was advised to call back or seek an in-person evaluation if the symptoms worsen or if the condition fails to improve as anticipated.  I provided 60 minutes of non-face-to-face time during this encounter.   Rachael [name], LCAS-A    Comprehensive Clinical Assessment (CCA) Note  06/01/2019 Rachael MayersSheila [name] [id number]  Visit Diagnosis:      ICD-10-CM   1. Panic disorder  F41.0   2. 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 GAD (generalized anxiety disorder)  F41.1       CCA Part One  Part One has been completed on paper by the patient.  (See scanned document in Chart Review)  CCA Part Two A  Intake/Chief Complaint:  CCA Intake With Chief Complaint CCA Part Two Date: 05/25/19 CCA Part Two Time: 1000 Chief Complaint/Presenting Problem: "I've always had anxiety even as a kid. I have panic attacks and shortness of breath. I was recommended to TMS but I want to try outpatient individual therapy first to see if that helps." Patients Currently Reported Symptoms/Problems: Excessive worry, panic attacks, shortness of breath, unsupportive husband, "I  hold a lot in then blow up at others", Hx of strained relationship w/ father, feelings of not wanting to dissapoint other poeple, marriage is struggling Collateral Involvement: Recommended by Dr. Lucianne [name] and [employer] [facility] Individual's Strengths: Strong love/bonds w/ family members, hx of resiliencey, exercises regularly Individual's Preferences: Individual therapy  Mental Health Symptoms Depression:  Depression: Difficulty Concentrating, Hopelessness, Fatigue, Irritability, Change in energy/activity, Worthlessness, Tearfulness  Mania:     Anxiety:      Psychosis:     Trauma:     Obsessions:     Compulsions:     Inattention:     Hyperactivity/Impulsivity:     Oppositional/Defiant Behaviors:     Borderline Personality:     Other Mood/Personality Symptoms:      Mental Status Exam Appearance and self-care  Stature:  Stature: Average  Weight:  Weight: Average weight  Clothing:  Clothing: Casual, Neat/clean  Grooming:  Grooming: Well-groomed  Cosmetic use:  Cosmetic Use: Age appropriate  Posture/gait:  Posture/Gait: Normal  Motor activity:  Motor Activity: Not Remarkable  Sensorium  Attention:  Attention: Normal  Concentration:  Concentration: Anxiety interferes, Preoccupied  Orientation:  Orientation: X5  Recall/memory:  Recall/Memory: Normal  Affect and Mood  Affect:  Affect: Flat  Mood:  Mood: Anxious  Relating  Eye contact:  Eye Contact: Normal  Facial expression:  Facial Expression: Responsive  Attitude toward examiner:  Attitude Toward Examiner: Cooperative  Thought and Language  Speech flow: Speech Flow: Normal  Thought content:  Thought Content: Appropriate to mood and circumstances  Preoccupation:  Hallucinations:     Organization:     Transport planner of Knowledge:  Fund of Knowledge: Average  Intelligence:  Intelligence: Average  Abstraction:  Abstraction: Functional  Judgement:  Judgement: Fair  Art therapist:  Reality Testing: Realistic   Insight:  Insight: Poor  Decision Making:  Decision Making: Only simple  Social Functioning  Social Maturity:  Social Maturity: Responsible  Social Judgement:  Social Judgement: Normal  Stress  Stressors:  Stressors: Family conflict, Transitions  Coping Ability:  Coping Ability: Deficient supports, English as a second language teacher Deficits:     Supports:      Family and Psychosocial History: Family history Marital status: Married Number of Years Married: 72 What types of issues is patient dealing with in the relationship?: arguments, husband is dismissive of PT's mental health issues Does patient have children?: Yes How many children?: 3 How is patient's relationship with their children?: Good, 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 All children are home now due to COVID-19  Childhood History:  Childhood History By whom was/is the patient raised?: Both parents Description of patient's relationship with caregiver when they were a child: Close w/ mom. Father was always distant, "I wondered if my father loved me; I was always insecure around father and did not want to dissapoint him". Patient's description of current relationship with people who raised him/her: Getting better w/ my father as he gets older. Good w/ mother How were you disciplined when you got in trouble as a child/adolescent?: spankings, told to go to room Does patient have siblings?: Yes Number of Siblings: 1 Description of patient's current relationship with siblings: good relationship w/ older sister Did patient suffer any verbal/emotional/physical/sexual abuse as a child?: No Did patient suffer from severe childhood neglect?: No Has patient ever been sexually abused/assaulted/raped as an adolescent or adult?: No Was the patient ever a victim of a crime or a disaster?: No Witnessed domestic violence?: No Has patient been effected by domestic violence as an adult?: No  CCA Part Two B  Employment/Work  Situation: Employment / Work Copywriter, advertising Employment situation: Unemployed  Education: Education Name of [employer]: [place], [place] Did Teacher, adult education From [employer]?: Yes  Religion:    Leisure/Recreation:    Exercise/Diet: Exercise/Diet Do You Exercise?: Yes What Type of Exercise Do You Do?: Run/Walk, Weight Training How Many Times a Week Do You Exercise?: 4-5 times a week Have You Gained or Lost A Significant Amount of Weight in the Past Six Months?: No Do You Follow a Special Diet?: No Do You Have Any Trouble Sleeping?: Yes Explanation of Sleeping Difficulties: Hard to fall asleep  CCA Part Two C  Alcohol/Drug Use: Alcohol / Drug Use History of alcohol / drug use?: No history of alcohol / drug abuse                      CCA Part Three  ASAM's:  Six Dimensions of Multidimensional Assessment  Dimension 1:  Acute Intoxication and/or Withdrawal Potential:     Dimension 2:  Biomedical Conditions and Complications:     Dimension 3:  Emotional, Behavioral, or Cognitive Conditions and Complications:     Dimension 4:  Readiness to Change:     Dimension 5:  Relapse, Continued use, or Continued Problem Potential:     Dimension 6:  Recovery/Living Environment:      Substance use Disorder (SUD)    Social Function:  Social Functioning Social Maturity: Responsible Social Judgement: Normal  Stress:  Stress Stressors: Family conflict, Transitions Coping Ability: Deficient supports, Overwhelmed Patient Takes Medications The Way The Doctor Instructed?: Yes Priority Risk: Low Acuity  Risk Assessment- Self-Harm Potential: Risk Assessment For Self-Harm Potential Thoughts of Self-Harm: No current thoughts Method: No plan  Risk Assessment -Dangerous to Others Potential: Risk Assessment For Dangerous to Others Potential Method: No Plan  DSM5 Diagnoses: Patient Active Problem List   Diagnosis Date Noted  . Recurrent major depression resistant to treatment (HCC)  02/24/2019  . Panic disorder 02/07/2019  . Benign essential tremor 01/04/2019  . Mild episode of recurrent major depressive disorder (HCC) 01/04/2019  . Genetic testing 12/22/2018  . Encounter for long-term current use of medication 10/15/2017  . Chronic prescription benzodiazepine use 08/31/2017  . 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 Osteoarthritis of first metatarsophalangeal joint 04/28/2017  . Abnormal weight gain 04/22/2017  . Borderline hyperlipidemia 01/20/2017  . Gastroesophageal reflux disease 12/23/2016  . Perimenopausal 11/20/2016  . Chronic fatigue 11/20/2016  . Vitamin D insufficiency 08/03/2014  . Anxiety and depression 08/02/2014  . Thinning hair 08/02/2014  . Essential hypertension, benign 08/02/2014    Patient Centered Plan: Patient is on the following Treatment Plan(s):  Anxiety  Recommendations for Services/Supports/Treatments: Recommendations for Services/Supports/Treatments Recommendations For Services/Supports/Treatments: Individual Therapy  Treatment Plan Summary:    Referrals to Alternative Service(s): Referred to Alternative Service(s):   Place:   Date:   Time:    Referred to Alternative Service(s):   Place:   Date:   Time:    Referred to Alternative Service(s):   Place:   Date:   Time:    Referred to Alternative Service(s):   Place:   Date:   Time:     Rachael CommonWesley E Emika Tiano

## 2019-06-04 ENCOUNTER — Other Ambulatory Visit (HOSPITAL_COMMUNITY): Payer: Self-pay | Admitting: Psychiatry

## 2019-06-14 ENCOUNTER — Ambulatory Visit (INDEPENDENT_AMBULATORY_CARE_PROVIDER_SITE_OTHER): Payer: 59 | Admitting: Licensed Clinical Social Worker

## 2019-06-14 DIAGNOSIS — F411 Generalized anxiety disorder: Secondary | ICD-10-CM

## 2019-06-14 DIAGNOSIS — F339 Major depressive disorder, recurrent, unspecified: Secondary | ICD-10-CM | POA: Diagnosis not present

## 2019-06-14 NOTE — Progress Notes (Signed)
Virtual Visit via Video Note  I connected with Rachael [name] on 06/14/19 at 12:30 PM EDT by a video enabled telemedicine application and verified that I am speaking with the correct person using two identifiers.  Location: Patient: Home Provider: Office   I discussed the limitations of evaluation and management by telemedicine and the availability of in person appointments. The patient expressed understanding and agreed to proceed.     I discussed the assessment and treatment plan with the patient. The patient was provided an opportunity to ask questions and all were answered. The patient agreed with the plan and demonstrated an understanding of the instructions.   The patient was advised to call back or seek an in-person evaluation if the symptoms worsen or if the condition fails to improve as anticipated.  I provided 55 minutes of non-face-to-face time during this encounter.   Archie Balboa, LCAS-A    THERAPIST PROGRESS NOTE  Session Time: 12:30-1:30  Participation Level: Active  Behavioral Response: Well GroomedAlertEuthymic  Type of Therapy: Individual Therapy  Treatment Goals addressed: Anxiety  Interventions: CBT and Supportive  Summary: Rachael [name] is a 54 y.o. female who presents with long hx of anxiety and low self esteem. She reports her sxs and daily functioning are mostly unchanged. She is alert and talkative in session. We spend time discussing her day-to-day functioing and the kinds of anxiety sxs  she experiences. Her anxiety stems mostly from fear of not being in control of situations and negative outcomes if she is not in charge of things. PT admits she loves to help others but does not love to ask for help herself. We discuss the role that her childhood family played in her ability to ask for help. PT states her mother was always telling the kids "to care what other people think about you lest you be judged by them".  PT becomes somewhat mildly tearful  when discussing her son's attention problem because it "reflects poorly on her".   Suicidal/Homicidal: Nowithout intent/plan  Therapist Response: I used open questions, active listening, and reflection of thoughts and feelings. I encouraged PT to disclose her emotions and stay w/ blunted emotions of sadness and disappointment when discussing her son and his attention problems.   Plan: Return again in 1 weeks.  Diagnosis:    ICD-10-CM   1. GAD (generalized anxiety disorder)  F41.1   2. Recurrent major depression resistant to treatment [facility])  F33.9        Archie Balboa, LCAS-A 06/14/2019

## 2019-06-15 LAB — HM PAP SMEAR: HM Pap smear: NEGATIVE

## 2019-06-15 LAB — RESULTS CONSOLE HPV: CHL HPV: NEGATIVE

## 2019-06-22 ENCOUNTER — Encounter (HOSPITAL_COMMUNITY): Payer: Self-pay | Admitting: Licensed Clinical Social Worker

## 2019-06-22 ENCOUNTER — Other Ambulatory Visit: Payer: Self-pay

## 2019-06-22 ENCOUNTER — Ambulatory Visit (INDEPENDENT_AMBULATORY_CARE_PROVIDER_SITE_OTHER): Payer: 59 | Admitting: Licensed Clinical Social Worker

## 2019-06-22 DIAGNOSIS — F339 Major depressive disorder, recurrent, unspecified: Secondary | ICD-10-CM | POA: Diagnosis not present

## 2019-06-22 DIAGNOSIS — F411 Generalized anxiety disorder: Secondary | ICD-10-CM

## 2019-06-22 NOTE — Progress Notes (Signed)
Virtual Visit via Video Note  I connected with Rachael [name] on 06/22/19 at 12:30 PM EDT by a video enabled telemedicine application and verified that I am speaking with the correct person using two identifiers.  Location: Patient: Home Provider: Office   I discussed the limitations of evaluation and management by telemedicine and the availability of in person appointments. The patient expressed understanding and agreed to proceed.   I discussed the assessment and treatment plan with the patient. The patient was provided an opportunity to ask questions and all were answered. The patient agreed with the plan and demonstrated an understanding of the instructions.   The patient was advised to call back or seek an in-person evaluation if the symptoms worsen or if the condition fails to improve as anticipated.  I provided 52  minutes of non-face-to-face time during this encounter.   Archie Balboa, LCAS-A    THERAPIST PROGRESS NOTE  Session Time: 12:30-1:30  Participation Level: Active  Behavioral Response: Well GroomedAlertAnxious  Type of Therapy: Individual Therapy  Treatment Goals addressed: Anxiety  Interventions: CBT and Supportive  Summary: Rachael [name] is a 53 y.o. female who presents with long hx of anxiety and low self esteem. She has blunted affect, reserved, guarded mood and admits she is feeling stressed due to unkown factors impacting school for her kids. PT describes feelings of "unease inside" herself. She compares her anxiety to "a big circle" that she gets trapped in. I ask PT about early experiences of exploring and learning as a child and PT reports that she has always learned best by doing. We talk about the constant inner messages of needing to "do better" and PT states she feels she is "grading herself". Part of her feels she is a good mother and her children tell her this too. PT struggles w/ communicating her feelings to her husband who often makes her feel  guilty for not knowing how to do something. I encourage PT to watch TED talk by Almond Lint on "Power of Vulnerability". PT is agreeable to this. PT states she often feels that if she accesses her true feelings of "exhaustion" then she "may never come out of them".   Suicidal/Homicidal: Nowithout intent/plan  Therapist Response: I used open questions, active listening, cognitive challenging, and exploration of early adverse childhood experiences. I taught coping mechanisms for affirming self in the midst of anxiety.   Plan: Return again in 2 weeks.  Diagnosis:    ICD-10-CM   1. GAD (generalized anxiety disorder)  F41.1   2. Recurrent major depression resistant to treatment [facility])  F33.9       Archie Balboa, LCAS-A 06/22/2019

## 2019-07-08 ENCOUNTER — Other Ambulatory Visit: Payer: Self-pay

## 2019-07-08 ENCOUNTER — Ambulatory Visit (INDEPENDENT_AMBULATORY_CARE_PROVIDER_SITE_OTHER): Payer: 59 | Admitting: Licensed Clinical Social Worker

## 2019-07-08 ENCOUNTER — Encounter (HOSPITAL_COMMUNITY): Payer: Self-pay | Admitting: Licensed Clinical Social Worker

## 2019-07-08 DIAGNOSIS — F339 Major depressive disorder, recurrent, unspecified: Secondary | ICD-10-CM

## 2019-07-08 DIAGNOSIS — F411 Generalized anxiety disorder: Secondary | ICD-10-CM | POA: Diagnosis not present

## 2019-07-08 NOTE — Progress Notes (Signed)
Virtual Visit via Video Note  I connected with Rachael [name] on 07/08/19 at  9:00 AM EDT by a video enabled telemedicine application and verified that I am speaking with the correct person using two identifiers.  Location: Patient: Home Provider: Office   I discussed the limitations of evaluation and management by telemedicine and the availability of in person appointments. The patient expressed understanding and agreed to proceed.    I discussed the assessment and treatment plan with the patient. The patient was provided an opportunity to ask questions and all were answered. The patient agreed with the plan and demonstrated an understanding of the instructions.   The patient was advised to call back or seek an in-person evaluation if the symptoms worsen or if the condition fails to improve as anticipated.  I provided 55 minutes of non-face-to-face time during this encounter.   Archie Balboa, LCAS-A    THERAPIST PROGRESS NOTE  Session Time: 9-10AM  Participation Level: Active  Behavioral Response: CasualAlertAnxious  Type of Therapy: Individual Therapy  Treatment Goals addressed: Anxiety and Coping  Interventions: CBT and Supportive  Summary: Rachael [name] is a 53 y.o. female who presents with hx of anxiety and depression resistant to treatment. She reports she watched Brene Brown's Vulnerability TED talk and appears agitated that she can not "just do what it says". PT reports she feels she cannot access how to be vulnerable. Her eyes are glassy and tears stream down her face throughout session though she quickly wipes them away and drinks her bottled water. I ask PT to talk from a place of vulnerability about "what she wishes her husband could understand". PT states that her husbands mother died suddenly of Breast Cancer this summer and she feels he is stuffing his emotions and comments on her own emotions as "irrational". I coach PT through ways to initiate small bids for  connection including asking how husband sees himself, and where he sees himself and their relationship in 1 year. PT reflects fondly on old times when they had a good relationship and fears that "they don't like any of the same things any more". PT takes notes about ways to become curious towards her husband w/o giving into his defensiveness and anger about being asked about himself.    Suicidal/Homicidal: Nowithout intent/plan  Therapist Response: I used open questions, active listening, and emotional reflection. I invited PT to experience her somatic response to her emotions in session. I encouraged PT for continueing therapy despite having a hx of "pulling herself up by own bootstraps". I coached PT through improving communication w/ her husband.   Plan: Return again in 2 weeks.  Diagnosis:    ICD-10-CM   1. GAD (generalized anxiety disorder)  F41.1   2. Recurrent major depression resistant to treatment [facility])  F33.9       Archie Balboa, LCAS-A 07/08/2019

## 2019-07-22 ENCOUNTER — Ambulatory Visit (INDEPENDENT_AMBULATORY_CARE_PROVIDER_SITE_OTHER): Payer: 59 | Admitting: Licensed Clinical Social Worker

## 2019-07-22 DIAGNOSIS — F339 Major depressive disorder, recurrent, unspecified: Secondary | ICD-10-CM | POA: Diagnosis not present

## 2019-07-22 DIAGNOSIS — F411 Generalized anxiety disorder: Secondary | ICD-10-CM | POA: Diagnosis not present

## 2019-07-27 ENCOUNTER — Encounter (HOSPITAL_COMMUNITY): Payer: Self-pay | Admitting: Licensed Clinical Social Worker

## 2019-07-27 NOTE — Progress Notes (Signed)
Virtual Visit via Video Note  I connected with Rachael [name] on 07/27/19 at 10:00 AM EDT by a video enabled telemedicine application and verified that I am speaking with the correct person using two identifiers.  Location: Patient: Home Provider: Office   I discussed the limitations of evaluation and management by telemedicine and the availability of in person appointments. The patient expressed understanding and agreed to proceed.     I discussed the assessment and treatment plan with the patient. The patient was provided an opportunity to ask questions and all were answered. The patient agreed with the plan and demonstrated an understanding of the instructions.   The patient was advised to call back or seek an in-person evaluation if the symptoms worsen or if the condition fails to improve as anticipated.  I provided 55 minutes of non-face-to-face time during this encounter.   Archie Balboa, LCAS-A    THERAPIST PROGRESS NOTE  Session Time: 10-11  Participation Level: Active  Behavioral Response: Well GroomedAlertAnxious  Type of Therapy: Individual Therapy  Treatment Goals addressed: Anxiety  Interventions: CBT and Supportive  Summary: Rachael [name] is a 53 y.o. female who presents with hx of treatment resistant anxiety and mood disorder. 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 She was able to follow through on her homework and ask her family what they want to strive for in the coming year. I discuss the concept of "codependency" and advise PT to read [facility] Beattie's book Codependent No More. PT is agreeable to this. PT describes a significant resolution to conflict for her autistic son.  Suicidal/Homicidal: Nowithout intent/plan  Therapist Response: I used open questions, active listening, and cognitive reframing. I encourage PT to "let out" any emotions she has been storing up. We discuss strategies for improving PT's  self talk.  Plan: Return again in 2 weeks.  Diagnosis:    ICD-10-CM   1. GAD (generalized anxiety disorder)  F41.1   2. Recurrent major depression resistant to treatment [facility])  F33.9       Archie Balboa, LCAS-A 07/27/2019

## 2019-08-09 ENCOUNTER — Ambulatory Visit (INDEPENDENT_AMBULATORY_CARE_PROVIDER_SITE_OTHER): Payer: 59 | Admitting: Licensed Clinical Social Worker

## 2019-08-09 ENCOUNTER — Encounter (HOSPITAL_COMMUNITY): Payer: Self-pay | Admitting: Licensed Clinical Social Worker

## 2019-08-09 DIAGNOSIS — F411 Generalized anxiety disorder: Secondary | ICD-10-CM | POA: Diagnosis not present

## 2019-08-09 NOTE — Progress Notes (Signed)
Virtual Visit via Video Note  I connected with Rachael [name] on 08/09/19 at 10:00 AM EDT by a video enabled telemedicine application and verified that I am speaking with the correct person using two identifiers.  Location: Patient: Home Provider: office   I discussed the limitations of evaluation and management by telemedicine and the availability of in person appointments. The patient expressed understanding and agreed to proceed.     I discussed the assessment and treatment plan with the patient. The patient was provided an opportunity to ask questions and all were answered. The patient agreed with the plan and demonstrated an understanding of the instructions.   The patient was advised to call back or seek an in-person evaluation if the symptoms worsen or if the condition fails to improve as anticipated.  I provided 55 minutes of non-face-to-face time during this encounter.   Archie Balboa, LCAS-A    THERAPIST PROGRESS NOTE  Session Time: 10-11  Participation Level: Active  Behavioral Response: NeatAlertAnxious  Type of Therapy: Individual Therapy  Treatment Goals addressed: Anxiety  Interventions: CBT and Supportive  Summary: Rachael [name] is a 53 y.o. female who presents with hx of anxiety and treatment resistant depression. She is active and talkative w/ blunted affect in our virtual session. She reports that her youngest son continues to struggle w/ math and testing in school which reminds her of herself. She reports that she noticed becoming very frustrated when she missed her workout due to his poor test performance and being asked by teacher to intervene. We discuss PT's hx of struggling w/ maintaining a certain weight and eating different foods than her family. It is unclear whether her sxs qualify as disrordered eating and more investigation is warrented. I reply that this is a concerning issue that needs more attention and she is agreeable to this. She wants to  get tested for ADD and I recommend her to [facility]. PT and I discuss how to ask her husband for a weekend away.    Suicidal/Homicidal: Nowithout intent/plan  Therapist Response: I used open questions, active listening, and reflection. I helped PT determine how to ask her husband for more time together.   Plan: Return again in 2 weeks.  Diagnosis:    ICD-10-CM   1. GAD (generalized anxiety disorder)  F41.1       Archie Balboa, LCAS-A 08/09/2019

## 2019-08-22 ENCOUNTER — Other Ambulatory Visit: Payer: Self-pay | Admitting: Physician Assistant

## 2019-08-22 DIAGNOSIS — F410 Panic disorder [episodic paroxysmal anxiety] without agoraphobia: Secondary | ICD-10-CM

## 2019-08-24 ENCOUNTER — Ambulatory Visit (HOSPITAL_COMMUNITY): Payer: 59 | Admitting: Licensed Clinical Social Worker

## 2019-09-14 ENCOUNTER — Ambulatory Visit (INDEPENDENT_AMBULATORY_CARE_PROVIDER_SITE_OTHER): Payer: 59 | Admitting: Licensed Clinical Social Worker

## 2019-09-14 ENCOUNTER — Encounter (HOSPITAL_COMMUNITY): Payer: Self-pay | Admitting: Licensed Clinical Social Worker

## 2019-09-14 ENCOUNTER — Other Ambulatory Visit: Payer: Self-pay

## 2019-09-14 DIAGNOSIS — F339 Major depressive disorder, recurrent, unspecified: Secondary | ICD-10-CM | POA: Diagnosis not present

## 2019-09-14 DIAGNOSIS — F411 Generalized anxiety disorder: Secondary | ICD-10-CM | POA: Diagnosis not present

## 2019-09-14 NOTE — Progress Notes (Signed)
Virtual Visit via Video Note  I connected with Rachael [name] on 09/14/19 at 10:00 AM EST by a video enabled telemedicine application and verified that I am speaking with the correct person using two identifiers.  Location: Patient: Home Provider: Office   I discussed the limitations of evaluation and management by telemedicine and the availability of in person appointments. The patient expressed understanding and agreed to proceed.    I discussed the assessment and treatment plan with the patient. The patient was provided an opportunity to ask questions and all were answered. The patient agreed with the plan and demonstrated an understanding of the instructions.   The patient was advised to call back or seek an in-person evaluation if the symptoms worsen or if the condition fails to improve as anticipated.  I provided 55 minutes of non-face-to-face time during this encounter.   Archie Balboa, LCAS-A    THERAPIST PROGRESS NOTE  Session Time: 10-11  Participation Level: Active  Behavioral Response: CasualAlertAnxious and Depressed  Type of Therapy: Individual Therapy  Treatment Goals addressed: Coping  Interventions: CBT and Supportive  Summary: Rachael [name] is a 53 y.o. female who presents with hx of GAD and MDD. She is talkative and tearful w/ blunted affect throughout session. She reports she is in a "negative mindset most days and can't get out of it". PT states she has tried to talk to her husband and he is dismissive of her requests and questions. PT and I work on her boundary setting w/ her children and encouraging them to do more around the house to help her. PT identified her main stressor as feeling "like she needs to listen to everything her son says to her". PT states she sometimes wishes she could "take a bottle of pills" but immediate states she would "never do that because of her responsibility to her children".  Suicidal/Homicidal: Nowithout intent/plan  PT  reports vague thoughts of wishing she could get "some relief" from all her stressors, denies any thoughts of wishing she was dead.  Therapist Response: I used open questions, active listening, and reframing. I helped PT express her boundaries and reframe her attempts at letting her kids handle themselves w/o her help. I helped PT identify and make goals for helping her children become more independent.   Plan: Return again in 2 weeks.  Diagnosis:    ICD-10-CM   1. GAD (generalized anxiety disorder)  F41.1   2. Recurrent major depression resistant to treatment [facility])  F33.9      Archie Balboa, LCAS-A 09/14/2019

## 2019-09-28 ENCOUNTER — Ambulatory Visit (INDEPENDENT_AMBULATORY_CARE_PROVIDER_SITE_OTHER): Payer: 59 | Admitting: Licensed Clinical Social Worker

## 2019-09-28 ENCOUNTER — Encounter (HOSPITAL_COMMUNITY): Payer: Self-pay | Admitting: Licensed Clinical Social Worker

## 2019-09-28 ENCOUNTER — Other Ambulatory Visit: Payer: Self-pay

## 2019-09-28 DIAGNOSIS — F411 Generalized anxiety disorder: Secondary | ICD-10-CM | POA: Diagnosis not present

## 2019-09-28 DIAGNOSIS — F339 Major depressive disorder, recurrent, unspecified: Secondary | ICD-10-CM | POA: Diagnosis not present

## 2019-09-28 NOTE — Progress Notes (Signed)
Virtual Visit via Video Note  I connected with Rachael [name] on 09/28/19 at 10:00 AM EST by a video enabled telemedicine application and verified that I am speaking with the correct person using two identifiers.  Location: Patient: Home Provider: Office   I discussed the limitations of evaluation and management by telemedicine and the availability of in person appointments. The patient expressed understanding and agreed to proceed.     I discussed the assessment and treatment plan with the patient. The patient was provided an opportunity to ask questions and all were answered. The patient agreed with the plan and demonstrated an understanding of the instructions.   The patient was advised to call back or seek an in-person evaluation if the symptoms worsen or if the condition fails to improve as anticipated.  I provided 55 minutes of non-face-to-face time during this encounter.   Archie Balboa, LCAS-A    THERAPIST PROGRESS NOTE  Session Time: 10-11AM  Participation Level: Active  Behavioral Response: Casual and Well GroomedAlertAnxious and Depressed  Type of Therapy: Individual Therapy  Treatment Goals addressed: Diagnosis: MDD  Interventions: CBT, Supportive and Meditation: Mindful awareness  Summary: Rachael [name] is a 53 y.o. female who presents with hx of treatment-resistant MDD and GAD. She is active, engaged, tearful, and easily frustrated w/ herself in session. She reports she continues to experience strong negative self talk and excessive worry about her children's abilities to cope w/ their own stressors. She is trying to "let go of the control of her son's bx". I use an intentional pause of silence which PT responds w/, "Are you waiting for me to talk?" PT goes on to say she needs to forgive people in her life. I ask her who. She states firstly, her husband. She eventually states that an incident happened 40yrs ago when she found her husband looking at pornography  which was out of his character and against their beliefs of marriage. We discuss how PT never talked about this w/ her husband but he stated he was sorry and he would never do it again. PT continues to feel confusion and guilt regarding the situation. I validate PT's feelings and encourage her that this is likely a strong contributor to her resistance to tx. I ask her check in w/ her breathing notice how she feels regarding sharing such personal information. She states she feels "good" and not judged. She smiles and laughs easily towards end of session.   Suicidal/Homicidal: Nowithout intent/plan  Therapist Response: I used open questions, intentional silence,reflection, and meaning making. I helped PT examine her core beliefs, and validated PT's experience of emotional pain from her husband. I encouraged mindful awareness of body to encourage further disclosure.   Plan: Return again in 2 weeks.  Diagnosis:    ICD-10-CM   1. Recurrent major depression resistant to treatment ([place])  F33.9   2. GAD (generalized anxiety disorder)  F41.1       Archie Balboa, LCAS-A 09/28/2019

## 2019-10-13 ENCOUNTER — Ambulatory Visit (INDEPENDENT_AMBULATORY_CARE_PROVIDER_SITE_OTHER): Payer: 59 | Admitting: Osteopathic Medicine

## 2019-10-13 ENCOUNTER — Encounter: Payer: Self-pay | Admitting: Osteopathic Medicine

## 2019-10-13 VITALS — Wt 135.0 lb

## 2019-10-13 DIAGNOSIS — R41840 Attention and concentration deficit: Secondary | ICD-10-CM | POA: Diagnosis not present

## 2019-10-13 DIAGNOSIS — F339 Major depressive disorder, recurrent, unspecified: Secondary | ICD-10-CM | POA: Diagnosis not present

## 2019-10-13 DIAGNOSIS — F410 Panic disorder [episodic paroxysmal anxiety] without agoraphobia: Secondary | ICD-10-CM

## 2019-10-13 NOTE — Progress Notes (Signed)
Virtual Visit via Video (App used: Doximity) Note  I connected with      Rachael [name] on 10/13/19 at 9:02 AM by a telemedicine application and verified that I am speaking with the correct person using two identifiers.   Patient is at home I am in office   I discussed the limitations of evaluation and management by telemedicine and the availability of in person appointments. The patient expressed understanding and agreed to proceed.  History of Present Illness: Rachael [name] is a 53 y.o. female who would like to discuss anxiety/depression    History of Present Illness:  Pleasant 53 yo F with longstanding history of treatment resistant depression and multiple medication failures (see below). Now reports increased anxiety. Therapist has some concerns about ADHD, given lack of concentration, feeling easily overwhelmed and then will procrastinate tasks, feeling disorganized.   Previous medications:  Trintellix caused nausea at higher doses and higher anxiety  Cymbalta did well for awhile, has been on this the longest  Hydroxyzine qhs prn insomnia or nighttime panic/agitation   Venlafaxine   Viibryd   Paxil   Wellbutrin   Probably several others she cannot recall   Current treatments:   Seeing therapist every other week   Cymbalta 30 mg bid   Buspirone 10 mg bid consistently, usually tid when she remembers   Alprazolam prn panic attacks when nothing else is working, uses this sparingly, last Rx #20 in 05/2019     10/13/19 today establishing with Dr Lyn Hollingshead:   GAD 7 : Generalized Anxiety Score 10/13/2019 02/22/2019 02/01/2019 12/22/2018  Nervous, Anxious, on Edge 3 3 3 3   Control/stop worrying 2 2 2 1   Worry too much - different things 1 3 3 1   Trouble relaxing 1 2 3  0  Restless 0 2 0 0  Easily annoyed or irritable 3 3 3 3   Afraid - awful might happen 1 2 2 1   Total GAD 7 Score 11 17 16 9   Anxiety Difficulty Somewhat difficult - - -    Depression  screen [facility] 2/9 10/13/2019 02/22/2019 02/08/2019  Decreased Interest 1 1 3   Down, Depressed, Hopeless 3 3 3   PHQ - 2 Score 4 4 6   Altered sleeping 0 2 2  Tired, decreased energy 2 2 3   Change in appetite 0 1 2  Feeling bad or failure about yourself  3 2 3   Trouble concentrating 2 2 3   Moving slowly or fidgety/restless 0 0 2  Suicidal thoughts 0 0 0  PHQ-9 Score 11 13 21   Difficult doing work/chores Somewhat difficult - Very difficult  Some encounter information is confidential and restricted. Go to Review Flowsheets activity to see all data.  Some recent data might be hidden      Observations/Objective: Wt 135 lb (61.2 kg)   BMI 23.54 kg/m  BP Readings from Last 3 Encounters:  02/22/19 113/76  02/01/19 128/84  01/04/19 139/82   Exam: Normal Speech.  NAD  Lab and Radiology Results No results found for this or any previous visit (from the past 72 hour(s)). No results found.     Assessment and Plan: 53 y.o. female with The primary encounter diagnosis was Recurrent major depression resistant to treatment (HCC). Diagnoses of Panic disorder and Concentration deficit were also pertinent to this visit.  Keep medications the same for now.  Will refer for attention deficit testing and go from there.  Patient is advised to call 14/12/2018 if she has not heard about referral within a  week, and to set up a follow-up appointment with [place] a week or 2 after her official evaluation  PDMP not reviewed this encounter. Orders Placed This Encounter  Procedures  . Ambulatory referral to Behavioral Health    Referral Priority:   Routine    Referral Type:   Psychiatric    Referral Reason:   Specialty Services Required    Requested Specialty:   Behavioral Health    Number of Visits Requested:   1   No orders of the defined types were placed in this encounter.  There are no Patient Instructions on file for this visit.     Follow Up Instructions: Return for RECHECK PENDING ADHD EVALUATION  RESULTS / IF WORSE OR CHANGE.    I discussed the assessment and treatment plan with the patient. The patient was provided an opportunity to ask questions and all were answered. The patient agreed with the plan and demonstrated an understanding of the instructions.   The patient was advised to call back or seek an in-person evaluation if any new concerns, if symptoms worsen or if the condition fails to improve as anticipated.  25 minutes of non-face-to-face time was provided during this encounter.      . . . . . . . . . . . . . Marland Kitchen                   Historical information moved to improve visibility of documentation.  Past Medical History:  Diagnosis Date  . Anxiety   . Depression   . Hypertension    Past Surgical History:  Procedure Laterality Date  . CESAREAN SECTION  01/04/1999   Social History   Tobacco Use  . Smoking status: Never Smoker  . Smokeless tobacco: Never Used  Substance Use Topics  . Alcohol use: No   family history includes Asthma in her mother; Breast cancer in her unknown relative; COPD in her mother; Diabetes in her unknown relative; Hyperlipidemia in her father; Hypertension in her father; Stroke in her unknown relative.  Medications: Current Outpatient Medications  Medication Sig Dispense Refill  . ALPRAZolam (XANAX) 0.5 MG tablet Take 1 tablet (0.5 mg total) by mouth once as needed for up to 1 dose for anxiety (panic attack). 20 tablet 0  . Biotin (BIOTIN 5000) 5 MG CAPS Take by mouth.    . busPIRone (BUSPAR) 10 MG tablet Take 1 tablet (10 mg total) by mouth 3 (three) times daily. 270 tablet 1  . DULoxetine (CYMBALTA) 30 MG capsule TAKE 1 CAPSULE TWICE A DAY 180 capsule 1  . Norethindrone Acetate-Ethinyl Estrad-FE (BLISOVI 24 FE) 1-20 MG-MCG(24) tablet Take 1 tablet by mouth daily. 91 tablet 2  . polyethylene glycol (MIRALAX / GLYCOLAX) packet Take 17 g by mouth 2 (two) times daily. Until stooling regularly 30 packet 11  .  famotidine (PEPCID) 20 MG tablet Take 20 mg by mouth daily as needed.    . ondansetron (ZOFRAN-ODT) 4 MG disintegrating tablet Take 1 tablet (4 mg total) by mouth every 8 (eight) hours as needed for nausea or vomiting. (Patient not taking: Reported on 10/13/2019) 10 tablet 0  . Probiotic Product (PROBIOTIC-10 PO) Take by mouth.     No current facility-administered medications for this visit.    Allergies  Allergen Reactions  . Abilify [Aripiprazole]     Panic attacks when coupled with cymbalta

## 2019-10-14 ENCOUNTER — Ambulatory Visit (INDEPENDENT_AMBULATORY_CARE_PROVIDER_SITE_OTHER): Payer: 59

## 2019-10-14 ENCOUNTER — Other Ambulatory Visit: Payer: Self-pay | Admitting: Sports Medicine

## 2019-10-14 ENCOUNTER — Other Ambulatory Visit: Payer: Self-pay

## 2019-10-14 DIAGNOSIS — Z1231 Encounter for screening mammogram for malignant neoplasm of breast: Secondary | ICD-10-CM | POA: Diagnosis not present

## 2019-10-19 ENCOUNTER — Ambulatory Visit (INDEPENDENT_AMBULATORY_CARE_PROVIDER_SITE_OTHER): Payer: 59 | Admitting: Licensed Clinical Social Worker

## 2019-10-19 ENCOUNTER — Encounter (HOSPITAL_COMMUNITY): Payer: Self-pay | Admitting: Licensed Clinical Social Worker

## 2019-10-19 DIAGNOSIS — F339 Major depressive disorder, recurrent, unspecified: Secondary | ICD-10-CM | POA: Diagnosis not present

## 2019-10-19 DIAGNOSIS — F411 Generalized anxiety disorder: Secondary | ICD-10-CM | POA: Diagnosis not present

## 2019-10-19 NOTE — Progress Notes (Signed)
Virtual Visit via Video Note  I connected with Rachael [name] on 10/19/19 at 11:00 AM EST by a video enabled telemedicine application and verified that I am speaking with the correct person using two identifiers.  Location Patient: Home Provider: Office   I discussed the limitations of evaluation and management by telemedicine and the availability of in person appointments. The patient expressed understanding and agreed to proceed.     I discussed the assessment and treatment plan with the patient. The patient was provided an opportunity to ask questions and all were answered. The patient agreed with the plan and demonstrated an understanding of the instructions.   The patient was advised to call back or seek an in-person evaluation if the symptoms worsen or if the condition fails to improve as anticipated.  I provided 55 minutes of non-face-to-face time during this encounter.   Archie Balboa, LCAS-A    THERAPIST PROGRESS NOTE  Session Time: [phone]  Participation Level: Active  Behavioral Response: CasualAlertAnxious  Type of Therapy: Individual Therapy  Treatment Goals addressed: Anxiety  Interventions: Other: IFS  Summary: Rachael [name] is a 53 y.o. female who presents with hx of tx-resistant depression and GAD. She reports her state is mostly unchanged from last session. I inquire about how she has acted towards her S/O given the significance of our previous session. PT is open and states she "realizes she cannot go there w/ her husband". She states she in uncomfortable being totally honest w/ her husband about her uncomfortable feelings and thoughts. I help Pt identify a part of herself, using IFS techniques, she labels "The uninteresting part" that wants to "be heard". I discuss the IFS Exile and Freight forwarder which PT understands. PT has blunted affect and frequently states she "doesn't know how to put the experience into words". I work to help PT make contact w/ exiled part  of herself (wounded inner child). PT states she is interested in continuing IFS in future sessions.   Suicidal/Homicidal: Nowithout intent/plan  Therapist Response: I used open questions, active listening, and therapeutic reflection. I used IFS techniques to help PT gain more awareness of her various parts and how to recognize their voice in her life.   Plan: Return again in 2 weeks.  Diagnosis:    ICD-10-CM   1. GAD (generalized anxiety disorder)  F41.1   2. Recurrent major depression resistant to treatment [facility] [place])  F33.9      Archie Balboa, LCAS-A 10/19/2019

## 2019-11-16 ENCOUNTER — Ambulatory Visit (INDEPENDENT_AMBULATORY_CARE_PROVIDER_SITE_OTHER): Payer: 59 | Admitting: Licensed Clinical Social Worker

## 2019-11-16 DIAGNOSIS — F411 Generalized anxiety disorder: Secondary | ICD-10-CM | POA: Diagnosis not present

## 2019-11-18 ENCOUNTER — Other Ambulatory Visit (HOSPITAL_COMMUNITY): Payer: Self-pay | Admitting: Psychiatry

## 2019-11-18 ENCOUNTER — Encounter (HOSPITAL_COMMUNITY): Payer: Self-pay | Admitting: Licensed Clinical Social Worker

## 2019-11-18 DIAGNOSIS — F410 Panic disorder [episodic paroxysmal anxiety] without agoraphobia: Secondary | ICD-10-CM

## 2019-11-18 NOTE — Progress Notes (Signed)
Virtual Visit via Video Note  I connected with [name] on 11/16/19 at 10:00 AM EST by a video enabled telemedicine application and verified that I am speaking with the correct person using two identifiers.  Location: Patient: Home Provider: Office   I discussed the limitations of evaluation and management by telemedicine and the availability of in person appointments. The patient expressed understanding and agreed to proceed.     I discussed the assessment and treatment plan with the patient. The patient was provided an opportunity to ask questions and all were answered. The patient agreed with the plan and demonstrated an understanding of the instructions.   The patient was advised to call back or seek an in-person evaluation if the symptoms worsen or if the condition fails to improve as anticipated.  I provided 50 minutes of non-face-to-face time during this encounter.   Margo Common, LCAS-A    THERAPIST PROGRESS NOTE  Session Time: 08-1049  Participation Level: Active  Behavioral Response: Well GroomedAlertEuthymic  Type of Therapy: Individual Therapy  Treatment Goals addressed: Anxiety  Interventions: Supportive and Other: IFS  Summary: Rachael [name] is a 54 y.o. female who presents with hx of GAD and MDD. She is attentive and engaged in session. She reports her holidays were good and her depression is decreasing. She continues to experience anxiety. We discuss PT's controlling mentality of her children and how she wishes they would do more but also struggles to allow them to assert themselves when they "mess up".   Suicidal/Homicidal: Nowithout intent/plan  Therapist Response: I used open questions, active listening, and MI techniques to help PT resolve ambivalence about her conflicted emotions.  Plan: Return again in 2 weeks.  Diagnosis:    ICD-10-CM   1. GAD (generalized anxiety disorder)  F41.1      Margo Common, LCAS-A 11/18/2019

## 2019-11-22 ENCOUNTER — Telehealth: Payer: Self-pay

## 2019-11-22 DIAGNOSIS — F410 Panic disorder [episodic paroxysmal anxiety] without agoraphobia: Secondary | ICD-10-CM

## 2019-11-22 MED ORDER — ALPRAZOLAM 0.5 MG PO TABS: 0.5000 mg | ORAL_TABLET | Freq: Once | ORAL | 0 refills | Status: DC | PRN

## 2019-11-22 NOTE — Telephone Encounter (Signed)
sent 

## 2019-11-22 NOTE — Telephone Encounter (Signed)
Pt called requesting med refill for xanax. As per pt, she discussed med refill with provider during virtual visit. Pt has been experiencing panic/anxiety attacks more frequently. Pls send rx to [facility].

## 2019-11-29 ENCOUNTER — Telehealth (HOSPITAL_COMMUNITY): Payer: Self-pay | Admitting: *Deleted

## 2019-11-29 NOTE — Telephone Encounter (Signed)
[facility] requesting refill of pt Cymbalta 30mg . Pt last seen 07/20 with no future appointments upcoming. Pt has been compliant with counseling appointments. Please review and advise.

## 2019-12-02 ENCOUNTER — Ambulatory Visit (HOSPITAL_COMMUNITY): Payer: 59 | Admitting: Licensed Clinical Social Worker

## 2019-12-02 ENCOUNTER — Other Ambulatory Visit (HOSPITAL_COMMUNITY): Payer: Self-pay | Admitting: *Deleted

## 2019-12-02 ENCOUNTER — Other Ambulatory Visit: Payer: Self-pay

## 2019-12-02 MED ORDER — DULOXETINE HCL 30 MG PO CPEP: 30.0000 mg | ORAL_CAPSULE | Freq: Two times a day (BID) | ORAL | 1 refills | Status: DC

## 2019-12-02 NOTE — Telephone Encounter (Signed)
Please fill the prescription

## 2019-12-04 ENCOUNTER — Other Ambulatory Visit: Payer: Self-pay | Admitting: Osteopathic Medicine

## 2019-12-04 DIAGNOSIS — F410 Panic disorder [episodic paroxysmal anxiety] without agoraphobia: Secondary | ICD-10-CM

## 2019-12-11 ENCOUNTER — Other Ambulatory Visit: Payer: Self-pay | Admitting: Osteopathic Medicine

## 2019-12-11 DIAGNOSIS — F410 Panic disorder [episodic paroxysmal anxiety] without agoraphobia: Secondary | ICD-10-CM

## 2019-12-14 ENCOUNTER — Encounter (HOSPITAL_COMMUNITY): Payer: Self-pay | Admitting: Licensed Clinical Social Worker

## 2019-12-14 ENCOUNTER — Ambulatory Visit (INDEPENDENT_AMBULATORY_CARE_PROVIDER_SITE_OTHER): Payer: 59 | Admitting: Licensed Clinical Social Worker

## 2019-12-14 DIAGNOSIS — F411 Generalized anxiety disorder: Secondary | ICD-10-CM

## 2019-12-14 DIAGNOSIS — F339 Major depressive disorder, recurrent, unspecified: Secondary | ICD-10-CM | POA: Diagnosis not present

## 2019-12-14 NOTE — Progress Notes (Signed)
Virtual Visit via Video Note  I connected with Rachael [name] on 12/14/19 at 11:00 AM EST by a video enabled telemedicine application and verified that I am speaking with the correct person using two identifiers.  Location: Patient: Home Provider: Office   I discussed the limitations of evaluation and management by telemedicine and the availability of in person appointments. The patient expressed understanding and agreed to proceed.      I discussed the assessment and treatment plan with the patient. The patient was provided an opportunity to ask questions and all were answered. The patient agreed with the plan and demonstrated an understanding of the instructions.   The patient was advised to call back or seek an in-person evaluation if the symptoms worsen or if the condition fails to improve as anticipated.  I provided 55 minutes of non-face-to-face time during this encounter.   Margo Common, LCAS-A    THERAPIST PROGRESS NOTE  Session Time: [phone]  Participation Level: Active  Behavioral Response: Well GroomedAlertDepressed and Tearful  Type of Therapy: Individual Therapy  Treatment Goals addressed: Anxiety and Coping  Interventions: CBT and Supportive  Summary: Rachael [name] is a 54 y.o. female who presents with h/o GAD and MDD, resistant to tx. She is slightly agitated in session and states she is "over COVID-19" and feels her life is being impacted too much by it. PT shares that she feels invalidated by her husband when she tries to share her insight gained from counseling. She also recognizes that she "invalidates herself" and is unable to validate herself internally. I allow PT to cry and express her frustration and judgment towards herself. I reframe PT's "inability to get over depression" as 'having depression and it is something she will need to manage". PT states she sometimes thinks about how death would bring relief from her perfectionistic thinking though she  quickly states she would not complete suicide since it would hurt her children.  Suicidal/Homicidal: Nowithout intent/plan  Therapist Response: I used open questions, active listening, and reframing and CBT to help PT identify and challenge automatic negative thoughts.   Plan: Return again in 2 weeks.  Diagnosis:    ICD-10-CM   1. GAD (generalized anxiety disorder)  F41.1   2. Recurrent major depression resistant to treatment [facility])  F33.9     Margo Common, LCAS-A 12/14/2019

## 2019-12-16 ENCOUNTER — Encounter: Payer: Self-pay | Admitting: Osteopathic Medicine

## 2020-03-21 ENCOUNTER — Other Ambulatory Visit: Payer: Self-pay | Admitting: Osteopathic Medicine

## 2020-03-21 DIAGNOSIS — F410 Panic disorder [episodic paroxysmal anxiety] without agoraphobia: Secondary | ICD-10-CM

## 2020-03-21 MED ORDER — ALPRAZOLAM 0.5 MG PO TABS: 0.5000 mg | ORAL_TABLET | Freq: Once | ORAL | 0 refills | Status: DC | PRN

## 2020-03-21 NOTE — Telephone Encounter (Signed)
[facility] requesting med refills for alprazolam. Last written 11/22/19.

## 2020-03-24 ENCOUNTER — Other Ambulatory Visit: Payer: Self-pay

## 2020-03-24 ENCOUNTER — Encounter: Payer: Self-pay | Admitting: Medical-Surgical

## 2020-03-24 ENCOUNTER — Ambulatory Visit (INDEPENDENT_AMBULATORY_CARE_PROVIDER_SITE_OTHER): Payer: 59 | Admitting: Medical-Surgical

## 2020-03-24 VITALS — BP 135/87 | HR 95 | Temp 98.2°F | Ht 62.5 in | Wt 134.2 lb

## 2020-03-24 DIAGNOSIS — R110 Nausea: Secondary | ICD-10-CM | POA: Diagnosis not present

## 2020-03-24 DIAGNOSIS — K219 Gastro-esophageal reflux disease without esophagitis: Secondary | ICD-10-CM | POA: Diagnosis not present

## 2020-03-24 DIAGNOSIS — R079 Chest pain, unspecified: Secondary | ICD-10-CM | POA: Diagnosis not present

## 2020-03-24 MED ORDER — PANTOPRAZOLE SODIUM 40 MG PO TBEC: 40.0000 mg | DELAYED_RELEASE_TABLET | Freq: Every day | ORAL | 2 refills | Status: DC

## 2020-03-24 MED ORDER — ONDANSETRON 8 MG PO TBDP: 8.0000 mg | ORAL_TABLET | Freq: Three times a day (TID) | ORAL | 3 refills | Status: DC | PRN

## 2020-03-24 NOTE — Patient Instructions (Signed)
Voltaren gel- up to 4 times a day to affected areas, get this OTC

## 2020-03-24 NOTE — Progress Notes (Signed)
Subjective:    CC: heartburn, nausea  HPI: Very pleasant 54 year old female presenting today with complaints of increased heartburn and nausea over the last 2 weeks.  Reports her symptoms started to increase about 2 weeks ago but has been worse over the last several days.  Experiencing is burning and pain in her chest that goes to her upper back.  Notes it is worse with spicy foods.  Has been having nausea and pain after most meals.  Try eating bland over the last couple of days and notes that her symptoms have improved somewhat.  Reports that her stools have been normal with no melena or hematochezia.  Having bowel movements daily to every other day but yesterday had 5-6 bowel movements.  These were hard at first and then got softer throughout the day.  Notes increased flatulence.  Tries to avoid most greasy/fried foods except the occasional indulgent.  Denies vomiting, fever, chills.  Taking famotidine 20 mg as needed but this has not helped with her symptoms.  Endorses regularly taking NSAIDs.  Denies alcohol use.  Patient reports several family members with gallstones.  I reviewed the past medical history, family history, social history, surgical history, and allergies today and no changes were needed.  Please see the problem list section below in epic for further details.  Past Medical History: Past Medical History:  Diagnosis Date  . Anxiety   . Depression   . Hypertension    Past Surgical History: Past Surgical History:  Procedure Laterality Date  . CESAREAN SECTION  01/04/1999   Social History: Social History   Socioeconomic History  . Marital status: Married    Spouse name: Not on file  . Number of children: Not on file  . Years of education: Not on file  . Highest education level: Not on file  Occupational History  . Not on file  Tobacco Use  . Smoking status: Never Smoker  . Smokeless tobacco: Never Used  Substance and Sexual Activity  . Alcohol use: No  . Drug use:  No  . Sexual activity: Yes    Partners: Male    Birth control/protection: None  Other Topics Concern  . Not on file  Social History Narrative  . Not on file   Social Determinants of Health   Financial Resource Strain:   . Difficulty of Paying Living Expenses:   Food Insecurity:   . Worried About Charity fundraiser in the Last Year:   . Arboriculturist in the Last Year:   Transportation Needs:   . Film/video editor (Medical):   Marland Kitchen Lack of Transportation (Non-Medical):   Physical Activity:   . Days of Exercise per Week:   . Minutes of Exercise per Session:   Stress:   . Feeling of Stress :   Social Connections:   . Frequency of Communication with Friends and Family:   . Frequency of Social Gatherings with Friends and Family:   . Attends Religious Services:   . Active Member of Clubs or Organizations:   . Attends Archivist Meetings:   Marland Kitchen Marital Status:    Family History: Family History  Problem Relation Age of Onset  . Breast cancer Other        aunts  . Diabetes Other        grandmother   . Stroke Other        grandmother   . Hypertension Father   . Hyperlipidemia Father   . Asthma Mother   .  COPD Mother    Allergies: Allergies  Allergen Reactions  . Abilify [Aripiprazole]     Panic attacks when coupled with cymbalta   Medications: See med rec.  Review of Systems: See HPI for pertinent positives and negatives.   Objective:    General: Well Developed, well nourished, and in no acute distress.  Neuro: Alert and oriented x3.  HEENT: Normocephalic, atraumatic.  Skin: Warm and dry. Cardiac: Regular rate and rhythm, no murmurs rubs or gallops, no lower extremity edema.  Respiratory: Clear to auscultation bilaterally. Not using accessory muscles, speaking in full sentences. Abdomen: Soft, nondistended.  Mild diffuse tenderness.  No sounds positive x4.  No HSM.  Negative Murphy sign.  EKG- rate 73, normal sinus rhythm, normal axis  Impression  and Recommendations:    1. Gastroesophageal reflux disease, unspecified whether esophagitis present Suspect GERD exacerbation but cannot rule out possible ulcer.  Low suspicion for gallbladder disease but if symptoms are refractory to PPI therapy, consider work-up for H. pylori and cholecystitis.  Discontinue famotidine. Starting Pantoprazole 40mg  daily. Plan to treat for 8 weeks then taper off as tolerated. Avoid acidic/spicy foods and NSAIDs. May take Tylenol and used topical Voltaren gel for joint discomfort.  2. Chest pain, unspecified type In office EKG- normal rate, rhythm, and axis with no acute changes. Most likely related to increased GERD symptoms but unable to rule out possible ulcer.  3. Nausea without vomiting Most likely related to increased GERD symptoms. Sending in Zofran 8mg  ODT as needed.   Return in about 8 weeks (around 05/19/2020) for GERD follow up. Please contact the office if no improvement in two weeks. ___________________________________________ , DNP, APRN, FNP-BC Primary Care and Sports Medicine [facility]

## 2020-05-12 ENCOUNTER — Ambulatory Visit (INDEPENDENT_AMBULATORY_CARE_PROVIDER_SITE_OTHER): Payer: 59 | Admitting: Osteopathic Medicine

## 2020-05-12 ENCOUNTER — Encounter: Payer: Self-pay | Admitting: Osteopathic Medicine

## 2020-05-12 VITALS — BP 124/74 | HR 75 | Wt 136.0 lb

## 2020-05-12 DIAGNOSIS — K219 Gastro-esophageal reflux disease without esophagitis: Secondary | ICD-10-CM

## 2020-05-12 DIAGNOSIS — F339 Major depressive disorder, recurrent, unspecified: Secondary | ICD-10-CM | POA: Diagnosis not present

## 2020-05-12 NOTE — Patient Instructions (Signed)
[email or web address]

## 2020-05-12 NOTE — Progress Notes (Signed)
Rachael [name] is a 54 y.o. female who presents to  [facility] Primary Care & Sports Medicine at [facility]  today, 05/12/20, seeking care for the following:  . Saw Joy about 8 weeks ago Tx for GERD w/ PPI. Noted improvement after a couple weeks . Mental health still a struggle. Tried/failed multiple medications over the years. Currently in therapy, planning possibly TMS      ASSESSMENT & PLAN with other pertinent findings:  The primary encounter diagnosis was Gastroesophageal reflux disease, unspecified whether esophagitis present. A diagnosis of Recurrent major depression resistant to treatment [facility]) was also pertinent to this visit.   1. Gastroesophageal reflux disease, unspecified whether esophagitis present D/C PPI when out If worse, will refer GI   2. Recurrent major depression resistant to treatment [facility]) Advised reconsider psych referral if Rx change needed Advised f/u re: TMS    Patient Instructions  [email or web address]    No orders of the defined types were placed in this encounter.   No orders of the defined types were placed in this encounter.      Follow-up instructions: Return if symptoms worsen or fail to improve.                                         BP 124/74 (BP Location: Left Arm, Patient Position: Sitting)   Pulse 75   Wt 136 lb (61.7 kg)   SpO2 100%   BMI 24.48 kg/m   Current Meds  Medication Sig  . ALPRAZolam (XANAX) 0.5 MG tablet Take 1 tablet (0.5 mg total) by mouth once as needed for up to 1 dose for anxiety (panic attack).  . Biotin (BIOTIN 5000) 5 MG CAPS Take 1 capsule by mouth once a week.   . busPIRone (BUSPAR) 10 MG tablet TAKE 1 TABLET (10 MG TOTAL) BY MOUTH 3 (THREE) TIMES DAILY. MAY TAKE AN ADDITIONAL 10 MG PRN  . DULoxetine (CYMBALTA) 30 MG capsule Take 1 capsule (30 mg total) by mouth 2 (two) times daily.  . famotidine (PEPCID) 20 MG  tablet Take 20 mg by mouth daily as needed.  . methylphenidate 18 MG PO CR tablet Take 18 mg by mouth daily.  . Multiple Vitamin tablet Take 1 tablet by mouth daily.  . Norethindrone Acetate-Ethinyl Estrad-FE (BLISOVI 24 FE) 1-20 MG-MCG(24) tablet Take 1 tablet by mouth daily.  . ondansetron (ZOFRAN-ODT) 8 MG disintegrating tablet Take 1 tablet (8 mg total) by mouth every 8 (eight) hours as needed for nausea.  . polyethylene glycol (MIRALAX / GLYCOLAX) packet Take 17 g by mouth 2 (two) times daily. Until stooling regularly  . Probiotic Product (PROBIOTIC-10 PO) Take 1 tablet by mouth daily as needed.     No results found for this or any previous visit (from the past 72 hour(s)).  No results found.     All questions at time of visit were answered - patient instructed to contact office with any additional concerns or updates.  ER/RTC precautions were reviewed with the patient as applicable.   Please note: voice recognition software was used to produce this document, and typos may escape review. Please contact Dr. Lyn Hollingshead for any needed clarifications.   Total encounter time: 30 minutes.

## 2020-06-07 ENCOUNTER — Other Ambulatory Visit: Payer: Self-pay | Admitting: Osteopathic Medicine

## 2020-06-07 ENCOUNTER — Other Ambulatory Visit (HOSPITAL_COMMUNITY): Payer: Self-pay | Admitting: Psychiatry

## 2020-06-07 DIAGNOSIS — F410 Panic disorder [episodic paroxysmal anxiety] without agoraphobia: Secondary | ICD-10-CM

## 2020-06-15 ENCOUNTER — Other Ambulatory Visit: Payer: Self-pay | Admitting: Medical-Surgical

## 2020-06-15 DIAGNOSIS — K219 Gastro-esophageal reflux disease without esophagitis: Secondary | ICD-10-CM

## 2020-06-15 NOTE — Telephone Encounter (Signed)
Routing to PCP

## 2020-08-02 ENCOUNTER — Other Ambulatory Visit (HOSPITAL_COMMUNITY): Payer: Self-pay | Admitting: Psychiatry

## 2020-08-07 ENCOUNTER — Other Ambulatory Visit: Payer: Self-pay

## 2020-08-07 DIAGNOSIS — F339 Major depressive disorder, recurrent, unspecified: Secondary | ICD-10-CM

## 2020-08-07 MED ORDER — DULOXETINE HCL 30 MG PO CPEP: 30.0000 mg | ORAL_CAPSULE | Freq: Two times a day (BID) | ORAL | 1 refills | Status: DC

## 2020-09-12 ENCOUNTER — Encounter: Payer: Self-pay | Admitting: Osteopathic Medicine

## 2020-09-12 ENCOUNTER — Ambulatory Visit (INDEPENDENT_AMBULATORY_CARE_PROVIDER_SITE_OTHER): Payer: 59 | Admitting: Osteopathic Medicine

## 2020-09-12 VITALS — BP 117/76 | HR 75 | Temp 97.7°F | Wt 136.0 lb

## 2020-09-12 DIAGNOSIS — F410 Panic disorder [episodic paroxysmal anxiety] without agoraphobia: Secondary | ICD-10-CM

## 2020-09-12 DIAGNOSIS — F339 Major depressive disorder, recurrent, unspecified: Secondary | ICD-10-CM | POA: Diagnosis not present

## 2020-09-12 DIAGNOSIS — F411 Generalized anxiety disorder: Secondary | ICD-10-CM

## 2020-09-12 MED ORDER — BUSPIRONE HCL 15 MG PO TABS: 15.0000 mg | ORAL_TABLET | Freq: Two times a day (BID) | ORAL | 3 refills | Status: DC

## 2020-09-12 MED ORDER — ALPRAZOLAM 0.5 MG PO TABS: 0.2500 mg | ORAL_TABLET | Freq: Two times a day (BID) | ORAL | 0 refills | Status: DC | PRN

## 2020-09-12 MED ORDER — DESVENLAFAXINE ER 50 MG PO TB24: 50.0000 mg | ORAL_TABLET | Freq: Every day | ORAL | 3 refills | Status: DC

## 2020-09-12 NOTE — Progress Notes (Signed)
Rachael [name] is a 54 y.o. female who presents to  [facility] Primary Care & Sports Medicine at [facility]  today, 09/12/20, seeking care for the following:  Marland Kitchen Mental Health - multiple medications tried in the past, has been on Cymba;ta bid for some time feels like it isn't helping as much as it used to.  . Previous Rx: Trintellix and Abilify caused increased anxiety. Lexapro, Wellbutrin, Zoloft not helpful     ASSESSMENT & PLAN with other pertinent findings:  The primary encounter diagnosis was GAD (generalized anxiety disorder). Diagnoses of Panic disorder and Recurrent major depression resistant to treatment [facility]) were also pertinent to this visit.   --> Multiple Rx tred and failed. Pt not interested in seeing psychiatry at this point. Will trial alternative SNRI. Reviewed Genesigth testing results. Low threshold for referral   No results found for this or any previous visit (from the past 24 hour(s)).  Depression screen [facility] 2/9 09/12/2020 10/13/2019 02/22/2019  Decreased Interest 1 1 1   Down, Depressed, Hopeless 1 3 3   PHQ - 2 Score 2 4 4   Altered sleeping 2 0 2  Tired, decreased energy 2 2 2   Change in appetite 0 0 1  Feeling bad or failure about yourself  2 3 2   Trouble concentrating 1 2 2   Moving slowly or fidgety/restless 0 0 0  Suicidal thoughts 0 0 0  PHQ-9 Score 9 11 13   Difficult doing work/chores - Somewhat difficult -  Some encounter information is confidential and restricted. Go to Review Flowsheets activity to see all data.  Some recent data might be hidden   GAD 7 : Generalized Anxiety Score 09/12/2020 10/13/2019 02/22/2019 02/01/2019  Nervous, Anxious, on Edge 3 3 3 3   Control/stop worrying 2 2 2 2   Worry too much - different things 2 1 3 3   Trouble relaxing 1 1 2 3   Restless 0 0 2 0  Easily annoyed or irritable 2 3 3 3   Afraid - awful might happen 2 1 2 2   Total GAD 7 Score 12 11 17 16   Anxiety Difficulty - Somewhat difficult - -        There are no Patient Instructions on file for this visit.  No orders of the defined types were placed in this encounter.   Meds ordered this encounter  Medications  . Desvenlafaxine ER (PRISTIQ) 50 MG TB24    Sig: Take 1 tablet (50 mg total) by mouth daily.    Dispense:  30 tablet    Refill:  3  . ALPRAZolam (XANAX) 0.5 MG tablet    Sig: Take 0.5-1 tablets (0.25-0.5 mg total) by mouth 2 (two) times daily as needed for anxiety (panic attack). LIMIT USE TO PREVENT TOLERANCE/DEPENDENCE. #30 FOR 90+ DAYS    Dispense:  30 tablet    Refill:  0  . busPIRone (BUSPAR) 15 MG tablet    Sig: Take 1 tablet (15 mg total) by mouth 2 (two) times daily.    Dispense:  60 tablet    Refill:  3       Follow-up instructions: Return for VIRTUAL VISIT OR IN-OFFICE VISIT RECHECK MENTAL HEALTH.                                         BP 117/76 (BP Location: Left Arm, Patient Position: Sitting, Cuff Size: Normal)   Pulse 75   Temp 97.7  F (36.5 C) (Oral)   Wt 136 lb 0.6 oz (61.7 kg)   BMI 24.49 kg/m   Current Meds  Medication Sig  . ALPRAZolam (XANAX) 0.5 MG tablet Take 0.5-1 tablets (0.25-0.5 mg total) by mouth 2 (two) times daily as needed for anxiety (panic attack). LIMIT USE TO PREVENT TOLERANCE/DEPENDENCE. #30 FOR 90+ DAYS  . Biotin (BIOTIN 5000) 5 MG CAPS Take 1 capsule by mouth once a week.   . busPIRone (BUSPAR) 15 MG tablet Take 1 tablet (15 mg total) by mouth 2 (two) times daily.  . Multiple Vitamin tablet Take 1 tablet by mouth daily.  . Norethindrone Acetate-Ethinyl Estrad-FE (BLISOVI 24 FE) 1-20 MG-MCG(24) tablet Take 1 tablet by mouth daily.  . pantoprazole (PROTONIX) 40 MG tablet TAKE 1 TABLET BY MOUTH EVERY DAY  . polyethylene glycol (MIRALAX / GLYCOLAX) packet Take 17 g by mouth 2 (two) times daily. Until stooling regularly  . [DISCONTINUED] ALPRAZolam (XANAX) 0.5 MG tablet Take 1 tablet (0.5 mg total) by mouth once as needed for up  to 1 dose for anxiety (panic attack).  . [DISCONTINUED] busPIRone (BUSPAR) 10 MG tablet TAKE 1 TABLET (10 MG TOTAL) BY MOUTH 3 (THREE) TIMES DAILY. MAY TAKE AN ADDITIONAL 10 MG PRN  . [DISCONTINUED] DULoxetine (CYMBALTA) 30 MG capsule Take 1 capsule (30 mg total) by mouth 2 (two) times daily.    No results found for this or any previous visit (from the past 72 hour(s)).  No results found.     All questions at time of visit were answered - patient instructed to contact office with any additional concerns or updates.  ER/RTC precautions were reviewed with the patient as applicable.   Please note: voice recognition software was used to produce this document, and typos may escape review. Please contact Dr. Lyn Hollingshead for any needed clarifications.

## 2020-09-27 ENCOUNTER — Encounter: Payer: Self-pay | Admitting: Osteopathic Medicine

## 2020-09-27 DIAGNOSIS — F339 Major depressive disorder, recurrent, unspecified: Secondary | ICD-10-CM

## 2020-09-27 MED ORDER — DULOXETINE HCL 30 MG PO CPEP: 30.0000 mg | ORAL_CAPSULE | Freq: Two times a day (BID) | ORAL | 1 refills | Status: DC

## 2020-10-10 ENCOUNTER — Telehealth: Payer: 59 | Admitting: Osteopathic Medicine

## 2020-11-15 ENCOUNTER — Other Ambulatory Visit: Payer: Self-pay | Admitting: Osteopathic Medicine

## 2020-11-15 DIAGNOSIS — Z1231 Encounter for screening mammogram for malignant neoplasm of breast: Secondary | ICD-10-CM

## 2020-11-16 ENCOUNTER — Ambulatory Visit (INDEPENDENT_AMBULATORY_CARE_PROVIDER_SITE_OTHER): Payer: 59

## 2020-11-16 ENCOUNTER — Other Ambulatory Visit: Payer: Self-pay

## 2020-11-16 DIAGNOSIS — Z1231 Encounter for screening mammogram for malignant neoplasm of breast: Secondary | ICD-10-CM | POA: Diagnosis not present

## 2020-11-30 ENCOUNTER — Other Ambulatory Visit: Payer: Self-pay | Admitting: Osteopathic Medicine

## 2020-11-30 DIAGNOSIS — F410 Panic disorder [episodic paroxysmal anxiety] without agoraphobia: Secondary | ICD-10-CM

## 2020-12-01 MED ORDER — ALPRAZOLAM 0.5 MG PO TABS: 0.2500 mg | ORAL_TABLET | Freq: Two times a day (BID) | ORAL | 0 refills | Status: DC | PRN

## 2020-12-01 NOTE — Telephone Encounter (Signed)
Attempted to contact patient regarding provider's note. Phone has a busy tone. Unable to leave a vm msg.

## 2020-12-01 NOTE — Telephone Encounter (Signed)
Refill request received for alprazolam/Xanax.  Patient should be using only 30 for 90 days, this refill request was sent a bit early, not due for refill until 12/11/2020.  Please call patient, can let her know I am sending this refill early but she needs to make 30 pills last 90 days which should be printed on her prescription bottle but just in case it's not.

## 2020-12-22 ENCOUNTER — Ambulatory Visit: Payer: 59 | Admitting: Medical-Surgical

## 2020-12-22 ENCOUNTER — Encounter: Payer: Self-pay | Admitting: Medical-Surgical

## 2020-12-22 ENCOUNTER — Other Ambulatory Visit: Payer: Self-pay

## 2020-12-22 VITALS — BP 122/78 | HR 83 | Wt 141.4 lb

## 2020-12-22 DIAGNOSIS — K219 Gastro-esophageal reflux disease without esophagitis: Secondary | ICD-10-CM | POA: Diagnosis not present

## 2020-12-22 NOTE — Patient Instructions (Addendum)
Pantoprazole (Protonix) 40mg    Gastroesophageal Reflux Disease, Adult  Gastroesophageal reflux (GER) happens when acid from the stomach flows up into the tube that connects the mouth and the stomach (esophagus). Normally, food travels down the esophagus and stays in the stomach to be digested. With GER, food and stomach acid sometimes move back up into the esophagus. You may have a disease called gastroesophageal reflux disease (GERD) if the reflux:  Happens often.  Causes frequent or very bad symptoms.  Causes problems such as damage to the esophagus. When this happens, the esophagus becomes sore and swollen. Over time, GERD can make small holes (ulcers) in the lining of the esophagus. What are the causes? This condition is caused by a problem with the muscle between the esophagus and the stomach. When this muscle is weak or not normal, it does not close properly to keep food and acid from coming back up from the stomach. The muscle can be weak because of:  Tobacco use.  Pregnancy.  Having a certain type of hernia (hiatal hernia).  Alcohol use.  Certain foods and drinks, such as coffee, chocolate, onions, and peppermint. What increases the risk?  Being overweight.  Having a disease that affects your connective tissue.  Taking NSAIDs, such a ibuprofen. What are the signs or symptoms?  Heartburn.  Difficult or painful swallowing.  The feeling of having a lump in the throat.  A bitter taste in the mouth.  Bad breath.  Having a lot of saliva.  Having an upset or bloated stomach.  Burping.  Chest pain. Different conditions can cause chest pain. Make sure you see your doctor if you have chest pain.  Shortness of breath or wheezing.  A long-term cough or a cough at night.  Wearing away of the surface of teeth (tooth enamel).  Weight loss. How is this treated?  Making changes to your diet.  Taking medicine.  Having surgery. Treatment will depend on how bad  your symptoms are. Follow these instructions at home: Eating and drinking  Follow a diet as told by your doctor. You may need to avoid foods and drinks such as: ? Coffee and tea, with or without caffeine. ? Drinks that contain alcohol. ? Energy drinks and sports drinks. ? Bubbly (carbonated) drinks or sodas. ? Chocolate and cocoa. ? Peppermint and mint flavorings. ? Garlic and onions. ? Horseradish. ? Spicy and acidic foods. These include peppers, chili powder, curry powder, vinegar, hot sauces, and BBQ sauce. ? Citrus fruit juices and citrus fruits, such as oranges, lemons, and limes. ? Tomato-based foods. These include red sauce, chili, salsa, and pizza with red sauce. ? Fried and fatty foods. These include donuts, french fries, potato chips, and high-fat dressings. ? High-fat meats. These include hot dogs, rib eye steak, sausage, ham, and bacon. ? High-fat dairy items, such as whole milk, butter, and cream cheese.  Eat small meals often. Avoid eating large meals.  Avoid drinking large amounts of liquid with your meals.  Avoid eating meals during the 2-3 hours before bedtime.  Avoid lying down right after you eat.  Do not exercise right after you eat.   Lifestyle  Do not smoke or use any products that contain nicotine or tobacco. If you need help quitting, ask your doctor.  Try to lower your stress. If you need help doing this, ask your doctor.  If you are overweight, lose an amount of weight that is healthy for you. Ask your doctor about a safe weight loss goal.  General instructions  Pay attention to any changes in your symptoms.  Take over-the-counter and prescription medicines only as told by your doctor.  Do not take aspirin, ibuprofen, or other NSAIDs unless your doctor says it is okay.  Wear loose clothes. Do not wear anything tight around your waist.  Raise (elevate) the head of your bed about 6 inches (15 cm). You may need to use a wedge to do this.  Avoid  bending over if this makes your symptoms worse.  Keep all follow-up visits. Contact a doctor if:  You have new symptoms.  You lose weight and you do not know why.  You have trouble swallowing or it hurts to swallow.  You have wheezing or a cough that keeps happening.  You have a hoarse voice.  Your symptoms do not get better with treatment. Get help right away if:  You have sudden pain in your arms, neck, jaw, teeth, or back.  You suddenly feel sweaty, dizzy, or light-headed.  You have chest pain or shortness of breath.  You vomit and the vomit is green, yellow, or black, or it looks like blood or coffee grounds.  You faint.  Your poop (stool) is red, bloody, or black.  You cannot swallow, drink, or eat. These symptoms may represent a serious problem that is an emergency. Do not wait to see if the symptoms will go away. Get medical help right away. Call your local emergency services (911 in the [place] Do not drive yourself to the hospital. Summary  If a person has gastroesophageal reflux disease (GERD), food and stomach acid move back up into the esophagus and cause symptoms or problems such as damage to the esophagus.  Treatment will depend on how bad your symptoms are.  Follow a diet as told by your doctor.  Take all medicines only as told by your doctor. This information is not intended to replace advice given to you by your health care provider. Make sure you discuss any questions you have with your health care provider. Document Revised: 05/08/2020 Document Reviewed: 05/08/2020 Elsevier Patient Education  2021 [employer].

## 2020-12-22 NOTE — Progress Notes (Signed)
Subjective:    CC: digestive issues/anxiety  HPI: Pleasant 55 year old female presenting today for digestive issues.  She was seen May 2021 for reflux symptoms.  At that time she was prescribed Protonix 40 mg daily which did help with her symptoms greatly.  She took this for a short while and then discontinued it to see how she would do off the medications.  Unfortunately her symptoms have returned.  She notes she is having chest tightness, shortness of breath, and intermittent nausea.  Also has occasional dizziness but this has been longstanding.  These symptoms are consistent with her presentation in May of last year.  She does still drink some caffeine but has cut back recently.  She drinks coffee daily but this is a low acid version.  She does chew gum and or eat mints.  Eats onions and other GERD triggers.  Was unsure if her symptoms were related to anxiety or a resurgence of GERD.  Denies fever, chills, vomiting, hematochezia, and melena.  I reviewed the past medical history, family history, social history, surgical history, and allergies today and no changes were needed.  Please see the problem list section below in epic for further details.  Past Medical History: Past Medical History:  Diagnosis Date  . Anxiety   . Depression   . Hypertension    Past Surgical History: Past Surgical History:  Procedure Laterality Date  . CESAREAN SECTION  01/04/1999   Social History: Social History   Socioeconomic History  . Marital status: Married    Spouse name: Not on file  . Number of children: Not on file  . Years of education: Not on file  . Highest education level: Not on file  Occupational History  . Not on file  Tobacco Use  . Smoking status: Never Smoker  . Smokeless tobacco: Never Used  Vaping Use  . Vaping Use: Never used  Substance and Sexual Activity  . Alcohol use: No  . Drug use: No  . Sexual activity: Yes    Partners: Male    Birth control/protection: None  Other  Topics Concern  . Not on file  Social History Narrative  . Not on file   Social Determinants of Health   Financial Resource Strain: Not on file  Food Insecurity: Not on file  Transportation Needs: Not on file  Physical Activity: Not on file  Stress: Not on file  Social Connections: Not on file   Family History: Family History  Problem Relation Age of Onset  . Breast cancer Other        aunts  . Diabetes Other        grandmother   . Stroke Other        grandmother   . Hypertension Father   . Hyperlipidemia Father   . Asthma Mother   . COPD Mother    Allergies: Allergies  Allergen Reactions  . Abilify [Aripiprazole]     Panic attacks when coupled with cymbalta   Medications: See med rec.  Review of Systems: See HPI for pertinent positives and negatives.   Objective:    General: Well Developed, well nourished, and in no acute distress.  Neuro: Alert and oriented x3.  HEENT: Normocephalic, atraumatic.  Skin: Warm and dry. Cardiac: Regular rate and rhythm, no murmurs rubs or gallops, no lower extremity edema.  Respiratory: Clear to auscultation bilaterally. Not using accessory muscles, speaking in full sentences. Abdomen: Soft, nontender, nondistended. Bowel sounds + x 4 quadrants. No HSM appreciated.  Impression  and Recommendations:    1. Gastroesophageal reflux disease, unspecified whether esophagitis present On review, it looks like Protonix 40 mg daily was sent in by Dr. Lyn Hollingshead in August 2021 with 1 year supply.  Recommend restarting this since she never picked it up.  Advised to call the pharmacy to see if this is still available.  If not, she will give [place] a call and I will resend the prescription.  Return if symptoms worsen or fail to improve. ___________________________________________ Thayer Ohm, DNP, APRN, FNP-BC Primary Care and Sports Medicine [facility] [place]

## 2021-01-30 ENCOUNTER — Other Ambulatory Visit: Payer: Self-pay | Admitting: Osteopathic Medicine

## 2021-01-30 DIAGNOSIS — F339 Major depressive disorder, recurrent, unspecified: Secondary | ICD-10-CM

## 2021-02-19 ENCOUNTER — Other Ambulatory Visit: Payer: Self-pay | Admitting: Osteopathic Medicine

## 2021-02-19 DIAGNOSIS — F410 Panic disorder [episodic paroxysmal anxiety] without agoraphobia: Secondary | ICD-10-CM

## 2021-05-30 ENCOUNTER — Other Ambulatory Visit: Payer: Self-pay | Admitting: Osteopathic Medicine

## 2021-05-30 DIAGNOSIS — F410 Panic disorder [episodic paroxysmal anxiety] without agoraphobia: Secondary | ICD-10-CM

## 2021-05-31 NOTE — Telephone Encounter (Signed)
Last appt 09/2020 Refilled Alprazolam 0.5 mg #30 Please have patient schedule follow-up for add'l refills

## 2021-06-25 ENCOUNTER — Other Ambulatory Visit: Payer: Self-pay

## 2021-06-25 ENCOUNTER — Ambulatory Visit (INDEPENDENT_AMBULATORY_CARE_PROVIDER_SITE_OTHER): Payer: 59 | Admitting: Osteopathic Medicine

## 2021-06-25 DIAGNOSIS — Z111 Encounter for screening for respiratory tuberculosis: Secondary | ICD-10-CM | POA: Diagnosis not present

## 2021-06-25 NOTE — Progress Notes (Signed)
Pt is here for PPD placement in left forearm.  Patient tolerated injection well without complications.

## 2021-06-27 ENCOUNTER — Other Ambulatory Visit: Payer: Self-pay

## 2021-06-27 ENCOUNTER — Ambulatory Visit (INDEPENDENT_AMBULATORY_CARE_PROVIDER_SITE_OTHER): Payer: 59 | Admitting: Osteopathic Medicine

## 2021-06-27 VITALS — BP 130/77 | HR 81 | Ht 63.0 in | Wt 141.0 lb

## 2021-06-27 DIAGNOSIS — Z111 Encounter for screening for respiratory tuberculosis: Secondary | ICD-10-CM

## 2021-06-27 NOTE — Progress Notes (Signed)
   Subjective:    Patient ID: Rachael [name], female    DOB: [birth year], 55 y.o.   MRN: [id number]  HPI Patient here for PPD read.    Review of Systems     Objective:   Physical Exam        Assessment & Plan:  Results were negative (no induration).

## 2021-07-14 ENCOUNTER — Other Ambulatory Visit: Payer: Self-pay | Admitting: Osteopathic Medicine

## 2021-07-14 DIAGNOSIS — K219 Gastro-esophageal reflux disease without esophagitis: Secondary | ICD-10-CM

## 2021-07-28 ENCOUNTER — Other Ambulatory Visit: Payer: Self-pay | Admitting: Osteopathic Medicine

## 2021-07-28 DIAGNOSIS — F339 Major depressive disorder, recurrent, unspecified: Secondary | ICD-10-CM

## 2021-08-18 ENCOUNTER — Other Ambulatory Visit: Payer: Self-pay | Admitting: Osteopathic Medicine

## 2021-08-18 DIAGNOSIS — F410 Panic disorder [episodic paroxysmal anxiety] without agoraphobia: Secondary | ICD-10-CM

## 2021-09-07 ENCOUNTER — Encounter: Payer: Self-pay | Admitting: Medical-Surgical

## 2021-09-10 ENCOUNTER — Telehealth: Payer: Self-pay | Admitting: Family Medicine

## 2021-09-10 DIAGNOSIS — F411 Generalized anxiety disorder: Secondary | ICD-10-CM

## 2021-09-10 NOTE — Telephone Encounter (Signed)
I have scheduled a f/u for med refills on Nov.10th. She only has a few of her Xanax which was discontinued. She needs a few to get her thru to her appt. Pharmacy on file is correct. Phone number on file is correct.

## 2021-09-11 MED ORDER — ALPRAZOLAM 0.5 MG PO TABS: 0.2500 mg | ORAL_TABLET | Freq: Two times a day (BID) | ORAL | 0 refills | Status: DC | PRN

## 2021-09-12 NOTE — Telephone Encounter (Signed)
LVM advising patient medication refill sent to pharmacy. Reiterated Nov 10th appt.

## 2021-09-15 ENCOUNTER — Other Ambulatory Visit: Payer: Self-pay | Admitting: Family Medicine

## 2021-09-15 DIAGNOSIS — F410 Panic disorder [episodic paroxysmal anxiety] without agoraphobia: Secondary | ICD-10-CM

## 2021-09-19 ENCOUNTER — Other Ambulatory Visit: Payer: Self-pay | Admitting: Family Medicine

## 2021-09-19 DIAGNOSIS — F410 Panic disorder [episodic paroxysmal anxiety] without agoraphobia: Secondary | ICD-10-CM

## 2021-09-20 ENCOUNTER — Other Ambulatory Visit: Payer: Self-pay

## 2021-09-20 ENCOUNTER — Encounter: Payer: Self-pay | Admitting: Family Medicine

## 2021-09-20 ENCOUNTER — Ambulatory Visit: Payer: 59 | Admitting: Family Medicine

## 2021-09-20 VITALS — BP 132/83 | HR 79 | Ht 63.0 in | Wt 143.0 lb

## 2021-09-20 DIAGNOSIS — F339 Major depressive disorder, recurrent, unspecified: Secondary | ICD-10-CM | POA: Diagnosis not present

## 2021-09-20 DIAGNOSIS — K219 Gastro-esophageal reflux disease without esophagitis: Secondary | ICD-10-CM | POA: Diagnosis not present

## 2021-09-20 DIAGNOSIS — R002 Palpitations: Secondary | ICD-10-CM | POA: Diagnosis not present

## 2021-09-20 DIAGNOSIS — F411 Generalized anxiety disorder: Secondary | ICD-10-CM | POA: Diagnosis not present

## 2021-09-20 MED ORDER — ALPRAZOLAM 0.5 MG PO TABS: 0.2500 mg | ORAL_TABLET | Freq: Two times a day (BID) | ORAL | 3 refills | Status: DC | PRN

## 2021-09-20 MED ORDER — DULOXETINE HCL 40 MG PO CPEP: 40.0000 mg | ORAL_CAPSULE | Freq: Two times a day (BID) | ORAL | 3 refills | Status: DC

## 2021-09-20 MED ORDER — DEXLANSOPRAZOLE 30 MG PO CPDR: 30.0000 mg | DELAYED_RELEASE_CAPSULE | Freq: Every day | ORAL | 3 refills | Status: DC

## 2021-09-20 NOTE — Assessment & Plan Note (Signed)
We will try switching from pantoprazole to Dexilant.  She has tried all over-the-counter options.

## 2021-09-20 NOTE — Assessment & Plan Note (Signed)
She continues to have difficulty with anxiety.  She does feel that Cymbalta is her more effective than other medications for management of her anxiety.  We will increase to 40 mg twice per day.  Continue BuSpar 3 times daily.  She does have alprazolam that she may use as needed.

## 2021-09-20 NOTE — Progress Notes (Signed)
Rachael [name] - 55 y.o. female MRN [id number]  Date of birth: 30-May-1966  Subjective Chief Complaint  Patient presents with   Anxiety    HPI Rachael [name] is a 55 year old female here today for follow-up visit.  This is her first visit with me.  She is a former patient of Dr. Lyn Hollingshead.  She has history of depression and anxiety.  She has taken several medications for management of the symptoms however has not really had great control of this at any point.  She feels like anxiety is more predominant.  She did see psychiatry and a therapist at one point.  She did not feel like psychiatrist was particularly helpful.  Her therapist left the practice and she did not seek anyone else after that.  She is currently treated with Cymbalta 30 mg twice daily.  She feels like this is worked better than anything she has taken previously.  She does also take BuSpar which is helpful.  She does have alprazolam but tries to limit use of this.  She also reports continued epigastric pain and reflux symptoms.  She has taken pantoprazole and had increased to twice daily however symptoms are still not well controlled.  She has tried over-the-counter medications of omeprazole, Nexium and Prevacid.  Depression screen [facility] 2/9 09/20/2021 12/22/2020 09/12/2020  Decreased Interest 2 2 1   Down, Depressed, Hopeless 2 2 1   PHQ - 2 Score 4 4 2   Altered sleeping 1 2 2   Tired, decreased energy 1 3 2   Change in appetite 0 0 0  Feeling bad or failure about yourself  3 3 2   Trouble concentrating 1 1 1   Moving slowly or fidgety/restless 0 1 0  Suicidal thoughts 0 1 0  PHQ-9 Score 10 15 9   Difficult doing work/chores Somewhat difficult Somewhat difficult -  Some encounter information is confidential and restricted. Go to Review Flowsheets activity to see all data.  Some recent data might be hidden   GAD 7 : Generalized Anxiety Score 09/20/2021 12/22/2020 09/12/2020 10/13/2019  Nervous, Anxious, on Edge 3 3 3 3   Control/stop  worrying 3 2 2 2   Worry too much - different things 2 3 2 1   Trouble relaxing 1 1 1 1   Restless 0 1 0 0  Easily annoyed or irritable 3 3 2 3   Afraid - awful might happen 1 2 2 1   Total GAD 7 Score 13 15 12 11   Anxiety Difficulty Somewhat difficult Somewhat difficult - Somewhat difficult      ROS:  A comprehensive ROS was completed and negative except as noted per HPI  Allergies  Allergen Reactions   Abilify [Aripiprazole]     Panic attacks when coupled with cymbalta    Past Medical History:  Diagnosis Date   Anxiety    Depression    Hypertension     Past Surgical History:  Procedure Laterality Date   CESAREAN SECTION  01/04/1999    Social History   Socioeconomic History   Marital status: Married    Spouse name: Not on file   Number of children: Not on file   Years of education: Not on file   Highest education level: Not on file  Occupational History   Not on file  Tobacco Use   Smoking status: Never   Smokeless tobacco: Never  Vaping Use   Vaping Use: Never used  Substance and Sexual Activity   Alcohol use: No   Drug use: No   Sexual activity: Yes    Partners:  Male    Birth control/protection: None  Other Topics Concern   Not on file  Social History Narrative   Not on file   Social Determinants of Health   Financial Resource Strain: Not on file  Food Insecurity: Not on file  Transportation Needs: Not on file  Physical Activity: Not on file  Stress: Not on file  Social Connections: Not on file    Family History  Problem Relation Age of Onset   Breast cancer Other        aunts   Diabetes Other        grandmother    Stroke Other        grandmother    Hypertension Father    Hyperlipidemia Father    Asthma Mother    COPD Mother     Health Maintenance  Topic Date Due   Pneumococcal Vaccine 65-23 Years old (1 - PCV) Never done   Hepatitis C Screening  Never done   Zoster Vaccines- Shingrix (1 of 2) Never done   COVID-19 Vaccine (2 -  Janssen risk series) 03/16/2020   PAP SMEAR-Modifier  09/04/2021   MAMMOGRAM  11/16/2022   TETANUS/TDAP  04/23/2027   COLONOSCOPY (Pts 45-22yrs Insurance coverage will need to be confirmed)  05/04/2028   INFLUENZA VACCINE  Completed   HPV VACCINES  Aged Out   HIV Screening  Discontinued     ----------------------------------------------------------------------------------------------------------------------------------------------------------------------------------------------------------------- Physical Exam BP 132/83 (BP Location: Left Arm, Patient Position: Sitting, Cuff Size: Small)   Pulse 79   Ht 5\' 3"  (1.6 m)   Wt 143 lb (64.9 kg)   SpO2 99%   BMI 25.33 kg/m   Physical Exam Constitutional:      Appearance: Normal appearance.  Abdominal:     General: Abdomen is flat. There is no distension.     Palpations: Abdomen is soft.     Tenderness: There is no abdominal tenderness.  Neurological:     General: No focal deficit present.     Mental Status: She is alert and oriented to person, place, and time.  Psychiatric:        Mood and Affect: Mood normal.        Behavior: Behavior normal.    ------------------------------------------------------------------------------------------------------------------------------------------------------------------------------------------------------------------- Assessment and Plan  GAD (generalized anxiety disorder) She continues to have difficulty with anxiety.  She does feel that Cymbalta is her more effective than other medications for management of her anxiety.  We will increase to 40 mg twice per day.  Continue BuSpar 3 times daily.  She does have alprazolam that she may use as needed.  Gastroesophageal reflux disease We will try switching from pantoprazole to Dexilant.  She has tried all over-the-counter options.   Meds ordered this encounter  Medications   DULoxetine 40 MG CPEP    Sig: Take 40 mg by mouth 2 (two) times  daily.    Dispense:  60 capsule    Refill:  3   Dexlansoprazole (DEXILANT) 30 MG capsule    Sig: Take 1 capsule (30 mg total) by mouth daily.    Dispense:  30 capsule    Refill:  3   ALPRAZolam (XANAX) 0.5 MG tablet    Sig: Take 0.5-1 tablets (0.25-0.5 mg total) by mouth 2 (two) times daily as needed for anxiety.    Dispense:  20 tablet    Refill:  3    No follow-ups on file.    This visit occurred during the SARS-CoV-2 public health emergency.  Safety protocols were in  place, including screening questions prior to the visit, additional usage of staff PPE, and extensive cleaning of exam room while observing appropriate contact time as indicated for disinfecting solutions.

## 2021-09-20 NOTE — Patient Instructions (Signed)
Nice to meet you today! Increase cymbalta to 40mg  twice daily.  Continue buspar with alprazolam as needed.  See me again in 6 weeks.   Let's try changing acid blocking medication to dexilant.

## 2021-09-21 LAB — CBC WITH DIFFERENTIAL/PLATELET
Absolute Monocytes: 290 cells/uL (ref 200–950)
Basophils Absolute: 18 cells/uL (ref 0–200)
Basophils Relative: 0.4 %
Eosinophils Absolute: 373 cells/uL (ref 15–500)
Eosinophils Relative: 8.1 %
HCT: 42.2 % (ref 35.0–45.0)
Hemoglobin: 14.2 g/dL (ref 11.7–15.5)
Lymphs Abs: 1868 cells/uL (ref 850–3900)
MCH: 31.1 pg (ref 27.0–33.0)
MCHC: 33.6 g/dL (ref 32.0–36.0)
MCV: 92.5 fL (ref 80.0–100.0)
MPV: 11.9 fL (ref 7.5–12.5)
Monocytes Relative: 6.3 %
Neutro Abs: 2052 cells/uL (ref 1500–7800)
Neutrophils Relative %: 44.6 %
Platelets: 286 10*3/uL (ref 140–400)
RBC: 4.56 10*6/uL (ref 3.80–5.10)
RDW: 12 % (ref 11.0–15.0)
Total Lymphocyte: 40.6 %
WBC: 4.6 10*3/uL (ref 3.8–10.8)

## 2021-09-21 LAB — BASIC METABOLIC PANEL
BUN: 12 mg/dL (ref 7–25)
CO2: 24 mmol/L (ref 20–32)
Calcium: 9.4 mg/dL (ref 8.6–10.4)
Chloride: 98 mmol/L (ref 98–110)
Creat: 0.97 mg/dL (ref 0.50–1.03)
Glucose, Bld: 90 mg/dL (ref 65–99)
Potassium: 5.1 mmol/L (ref 3.5–5.3)
Sodium: 131 mmol/L — ABNORMAL LOW (ref 135–146)

## 2021-09-21 LAB — TSH+FREE T4: TSH W/REFLEX TO FT4: 1.6 mIU/L

## 2021-09-28 ENCOUNTER — Other Ambulatory Visit: Payer: Self-pay | Admitting: Family Medicine

## 2021-09-28 DIAGNOSIS — E871 Hypo-osmolality and hyponatremia: Secondary | ICD-10-CM

## 2021-10-18 ENCOUNTER — Other Ambulatory Visit: Payer: Self-pay | Admitting: Family Medicine

## 2021-10-18 DIAGNOSIS — F339 Major depressive disorder, recurrent, unspecified: Secondary | ICD-10-CM

## 2021-11-01 ENCOUNTER — Encounter: Payer: Self-pay | Admitting: Family Medicine

## 2021-11-01 ENCOUNTER — Telehealth: Payer: 59 | Admitting: Family Medicine

## 2021-11-01 ENCOUNTER — Telehealth: Payer: Self-pay

## 2021-11-01 DIAGNOSIS — F339 Major depressive disorder, recurrent, unspecified: Secondary | ICD-10-CM

## 2021-11-01 DIAGNOSIS — K219 Gastro-esophageal reflux disease without esophagitis: Secondary | ICD-10-CM | POA: Diagnosis not present

## 2021-11-01 NOTE — Telephone Encounter (Signed)
Medication: Dexlansoprazole (DEXILANT) 30 MG capsule Prior authorization submitted via CoverMyMeds on 11/01/2021 [place] submission pending

## 2021-11-01 NOTE — Progress Notes (Signed)
Rachael [name] - 55 y.o. female MRN [id number]  Date of birth: [birth year]   This visit type was conducted due to national recommendations for restrictions regarding the COVID-19 Pandemic (e.g. social distancing).  This format is felt to be most appropriate for this patient at this time.  All issues noted in this document were discussed and addressed.  No physical exam was performed (except for noted visual exam findings with Video Visits).  I discussed the limitations of evaluation and management by telemedicine and the availability of in person appointments. The patient expressed understanding and agreed to proceed.  I connected withNAME@ on 11/01/21 at  9:30 AM EST by a video enabled telemedicine application and verified that I am speaking with the correct person using two identifiers.  Present at visit: Everrett Coombe, DO [name]   Patient Location: Home [address] [place] [address]   Provider location:   [facility]  Chief Complaint  Patient presents with   Anxiety   Depression    HPI  Rachael [name] is a 55 y.o. female who presents via audio/video conferencing for a telehealth visit today.  She was following up today for anxiety and depression.  Reports that she has had slight improvement with increase of duloxetine.  She has not fully increased to the 40 mg twice daily.  She does continue to have some fatigue.  He is waiting on approval of Dexilant.  For now she is continuing on Protonix.  Still having some breakthrough symptoms.  ROS:  A comprehensive ROS was completed and negative except as noted per HPI   ROS:  A comprehensive ROS was completed and negative except as noted per HPI  Past Medical History:  Diagnosis Date   Anxiety    Depression    Hypertension     Past Surgical History:  Procedure Laterality Date   CESAREAN SECTION  01/04/1999    Family History  Problem Relation Age of Onset   Breast cancer Other        aunts   Diabetes Other         grandmother    Stroke Other        grandmother    Hypertension Father    Hyperlipidemia Father    Asthma Mother    COPD Mother     Social History   Socioeconomic History   Marital status: Married    Spouse name: Not on file   Number of children: Not on file   Years of education: Not on file   Highest education level: Not on file  Occupational History   Not on file  Tobacco Use   Smoking status: Never   Smokeless tobacco: Never  Vaping Use   Vaping Use: Never used  Substance and Sexual Activity   Alcohol use: No   Drug use: No   Sexual activity: Yes    Partners: Male    Birth control/protection: None  Other Topics Concern   Not on file  Social History Narrative   Not on file   Social Determinants of Health   Financial Resource Strain: Not on file  Food Insecurity: Not on file  Transportation Needs: Not on file  Physical Activity: Not on file  Stress: Not on file  Social Connections: Not on file  Intimate Partner Violence: Not on file     Current Outpatient Medications:    ALPRAZolam (XANAX) 0.5 MG tablet, Take 0.5-1 tablets (0.25-0.5 mg total) by mouth 2 (two) times daily as needed for anxiety., Disp:  20 tablet, Rfl: 3   Biotin 5 MG CAPS, Take 1 capsule by mouth once a week. , Disp: , Rfl:    busPIRone (BUSPAR) 10 MG tablet, Take 1 tablet (10 mg total) by mouth 3 (three) times daily. Needs appointment., Disp: 90 tablet, Rfl: 0   DULoxetine HCl 40 MG CPEP, TAKE 1 CAPSULE BY MOUTH TWICE A DAY, Disp: 180 capsule, Rfl: 2   HAILEY 24 FE 1-20 MG-MCG(24) tablet, Take 1 tablet by mouth daily., Disp: , Rfl:    Multiple Vitamin tablet, Take 1 tablet by mouth daily., Disp: , Rfl:    Dexlansoprazole (DEXILANT) 30 MG capsule, Take 1 capsule (30 mg total) by mouth daily. (Patient not taking: Reported on 11/01/2021), Disp: 30 capsule, Rfl: 3   pantoprazole (PROTONIX) 40 MG tablet, Take 40 mg by mouth daily., Disp: , Rfl:   EXAM:  VITALS per patient if applicable: Ht 5\' 3"   (1.6 m)    Wt 143 lb (64.9 kg)    BMI 25.33 kg/m   GENERAL: alert, oriented, appears well and in no acute distress  HEENT: atraumatic, conjunttiva clear, no obvious abnormalities on inspection of external nose and ears  NECK: normal movements of the head and neck  LUNGS: on inspection no signs of respiratory distress, breathing rate appears normal, no obvious gross SOB, gasping or wheezing  CV: no obvious cyanosis  MS: moves all visible extremities without noticeable abnormality  PSYCH/NEURO: pleasant and cooperative, no obvious depression or anxiety, speech and thought processing grossly intact  ASSESSMENT AND PLAN:  Discussed the following assessment and plan:  Gastroesophageal reflux disease Still awaiting approval of Dexilant.  Discussed with today and she will work on [employer] for this.  Recurrent major depression resistant to treatment [facility]) She has had some improvement with increase of duloxetine.  Continue with titration to 40 mg twice daily.     I discussed the assessment and treatment plan with the patient. The patient was provided an opportunity to ask questions and all were answered. The patient agreed with the plan and demonstrated an understanding of the instructions.   The patient was advised to call back or seek an in-person evaluation if the symptoms worsen or if the condition fails to improve as anticipated.    [facility], DO

## 2021-11-01 NOTE — Assessment & Plan Note (Signed)
She has had some improvement with increase of duloxetine.  Continue with titration to 40 mg twice daily.

## 2021-11-01 NOTE — Assessment & Plan Note (Signed)
Still awaiting approval of Dexilant.  Discussed with [employer] today and she will work on [place] for this.

## 2021-11-01 NOTE — Progress Notes (Signed)
Has been taking Duloxetine - 30 mg in the AM, 40 in the PM Having severe lack of energy, did not want to go to 40 mg BID (had 30's left)  PHQ9 (8) -GAD7 (10) completed.    Dexilant not approved yet, still taking Pantoprazole 40 mg daily  Husband tested positive for Covid, she has no symptoms, she wanted to wait til closer to Christmas to test to decide on being around others.

## 2021-11-14 NOTE — Telephone Encounter (Signed)
Medication: Dexlansoprazole (DEXILANT) 30 MG capsule Prior authorization determination received Medication has been approved Approval dates: 11/01/2021-11/01/2022  Patient aware via: MyChart Pharmacy aware: Yes Provider aware via this encounter

## 2021-12-19 ENCOUNTER — Other Ambulatory Visit: Payer: Self-pay | Admitting: Family Medicine

## 2021-12-19 DIAGNOSIS — F410 Panic disorder [episodic paroxysmal anxiety] without agoraphobia: Secondary | ICD-10-CM

## 2022-01-16 ENCOUNTER — Other Ambulatory Visit: Payer: Self-pay | Admitting: Family Medicine

## 2022-01-16 DIAGNOSIS — F410 Panic disorder [episodic paroxysmal anxiety] without agoraphobia: Secondary | ICD-10-CM

## 2022-02-18 ENCOUNTER — Other Ambulatory Visit: Payer: Self-pay | Admitting: Family Medicine

## 2022-02-18 DIAGNOSIS — F410 Panic disorder [episodic paroxysmal anxiety] without agoraphobia: Secondary | ICD-10-CM

## 2022-02-18 NOTE — Telephone Encounter (Signed)
Pls contact pt to schedule 6 month follow-up due in June. Thanks ?

## 2022-02-18 NOTE — Telephone Encounter (Signed)
LVM for patient to call back to get appt scheduled. AM 

## 2022-03-14 ENCOUNTER — Other Ambulatory Visit: Payer: Self-pay | Admitting: Family Medicine

## 2022-03-14 DIAGNOSIS — F410 Panic disorder [episodic paroxysmal anxiety] without agoraphobia: Secondary | ICD-10-CM

## 2022-04-16 ENCOUNTER — Ambulatory Visit: Payer: 59 | Admitting: Family Medicine

## 2022-04-16 ENCOUNTER — Encounter: Payer: Self-pay | Admitting: Family Medicine

## 2022-04-16 VITALS — BP 121/72 | HR 70 | Ht 63.0 in | Wt 139.0 lb

## 2022-04-16 DIAGNOSIS — R5382 Chronic fatigue, unspecified: Secondary | ICD-10-CM

## 2022-04-16 DIAGNOSIS — K219 Gastro-esophageal reflux disease without esophagitis: Secondary | ICD-10-CM

## 2022-04-16 DIAGNOSIS — I10 Essential (primary) hypertension: Secondary | ICD-10-CM | POA: Diagnosis not present

## 2022-04-16 DIAGNOSIS — F419 Anxiety disorder, unspecified: Secondary | ICD-10-CM

## 2022-04-16 DIAGNOSIS — R5383 Other fatigue: Secondary | ICD-10-CM

## 2022-04-16 DIAGNOSIS — F32A Depression, unspecified: Secondary | ICD-10-CM

## 2022-04-16 DIAGNOSIS — F339 Major depressive disorder, recurrent, unspecified: Secondary | ICD-10-CM | POA: Diagnosis not present

## 2022-04-16 DIAGNOSIS — F411 Generalized anxiety disorder: Secondary | ICD-10-CM | POA: Diagnosis not present

## 2022-04-16 MED ORDER — DULOXETINE HCL 60 MG PO CPEP: 60.0000 mg | ORAL_CAPSULE | Freq: Every day | ORAL | 1 refills | Status: DC

## 2022-04-16 MED ORDER — PANTOPRAZOLE SODIUM 40 MG PO TBEC
40.0000 mg | DELAYED_RELEASE_TABLET | Freq: Every day | ORAL | 3 refills | Status: DC
Start: 2022-04-16 — End: 2023-04-18

## 2022-04-16 NOTE — Assessment & Plan Note (Signed)
Blood pressure stable at this time without medication.  She does exercise regularly and feel like diet is pretty good.  Continue management through diet and lifestyle change.

## 2022-04-16 NOTE — Assessment & Plan Note (Signed)
Orders Placed This Encounter  Procedures  . Basic Metabolic Panel (BMET)  . CBC with Differential  . Iron, TIBC and Ferritin Panel  . Vitamin D (25 hydroxy)  . B12  . TSH  . Ambulatory referral to Psychology    Referral Priority:   Routine    Referral Type:   Psychiatric    Referral Reason:   Specialty Services Required    Requested Specialty:   Psychology    Number of Visits Requested:   1

## 2022-04-16 NOTE — Assessment & Plan Note (Signed)
Currently on Cymbalta.  Increasing to 60 mg.  Continue BuSpar at current strength.  She may continue alprazolam as needed but we discussed trying to cut back some on this.  Referral placed for counseling/therapy.

## 2022-04-16 NOTE — Patient Instructions (Signed)
Let's try increasing duloxetine to 60mg  daily.  Continue buspar Continue alprazolam as needed.  I have entered a referral to see a therapist.

## 2022-04-16 NOTE — Assessment & Plan Note (Addendum)
Unfortunately Dexilant was too expensive.  Adding pantoprazole back on.

## 2022-04-16 NOTE — Progress Notes (Signed)
Rachael [name] - 56 y.o. female MRN [id number]  Date of birth: November 11, 1966  Subjective Chief Complaint  Patient presents with   Depression   Panic Attack    Rachael [name] was a 56 year old female here today for follow-up visit.  She reports having increased anxiety over the past several months.  She continues on Cymbalta for history of depression and anxiety.  Additionally she is taking BuSpar.  She has needed to use her alprazolam more frequently.  She was using every couple weeks now she is using every couple of days.  She is not sure what has increased her anxiety.  She has seen a therapist at one point however this was during the pandemic and was virtual.  She will be interested in seeing someone in person.  She does continue to have quite a bit of fatigue.  We did some basic lab work previously which was normal.  She would like to have additional lab testing today.  Continues to have problems with reflux.  She has tried Protonix previously had slight relief did have breakthrough symptoms.  We will try to get Dexilant approved for her however this proved to be too expensive.  ROS:  A comprehensive ROS was completed and negative except as noted per Rachael  Allergies  Allergen Reactions   Abilify [Aripiprazole]     Panic attacks when coupled with cymbalta    Past Medical History:  Diagnosis Date   Anxiety    Depression    Hypertension     Past Surgical History:  Procedure Laterality Date   CESAREAN SECTION  01/04/1999    Social History   Socioeconomic History   Marital status: Married    Spouse name: Not on file   Number of children: Not on file   Years of education: Not on file   Highest education level: Not on file  Occupational History   Not on file  Tobacco Use   Smoking status: Never   Smokeless tobacco: Never  Vaping Use   Vaping Use: Never used  Substance and Sexual Activity   Alcohol use: No   Drug use: No   Sexual activity: Yes    Partners: Male    Birth  control/protection: None  Other Topics Concern   Not on file  Social History Narrative   Not on file   Social Determinants of Health   Financial Resource Strain: Not on file  Food Insecurity: Not on file  Transportation Needs: Not on file  Physical Activity: Not on file  Stress: Not on file  Social Connections: Not on file    Family History  Problem Relation Age of Onset   Breast cancer Other        aunts   Diabetes Other        grandmother    Stroke Other        grandmother    Hypertension Father    Hyperlipidemia Father    Asthma Mother    COPD Mother     Health Maintenance  Topic Date Due   COVID-19 Vaccine (2 - Janssen risk series) 05/02/2022 (Originally 03/16/2020)   Zoster Vaccines- Shingrix (1 of 2) 07/17/2022 (Originally 10/16/1987)   Hepatitis C Screening  04/17/2023 (Originally 10/15/1986)   INFLUENZA VACCINE  06/11/2022   MAMMOGRAM  11/16/2022   PAP SMEAR-Modifier  06/14/2024   TETANUS/TDAP  04/23/2027   COLONOSCOPY (Pts 45-66yrs Insurance coverage will need to be confirmed)  05/04/2028   HPV VACCINES  Aged Out   HIV Screening  Discontinued     ----------------------------------------------------------------------------------------------------------------------------------------------------------------------------------------------------------------- Physical Exam BP 121/72 (BP Location: Left Arm, Patient Position: Sitting, Cuff Size: Normal)   Pulse 70   Ht 5\' 3"  (1.6 m)   Wt 139 lb (63 kg)   SpO2 98%   BMI 24.62 kg/m   Physical Exam Constitutional:      Appearance: Normal appearance.  Eyes:     General: No scleral icterus. Cardiovascular:     Rate and Rhythm: Normal rate and regular rhythm.  Pulmonary:     Effort: Pulmonary effort is normal.     Breath sounds: Normal breath sounds.  Musculoskeletal:     Cervical back: Neck supple.  Neurological:     Mental Status: She is alert.  Psychiatric:        Mood and Affect: Mood normal.         Behavior: Behavior normal.    ------------------------------------------------------------------------------------------------------------------------------------------------------------------------------------------------------------------- Assessment and Plan  Essential hypertension, benign Blood pressure stable at this time without medication.  She does exercise regularly and feel like diet is pretty good.  Continue management through diet and lifestyle change.  Gastroesophageal reflux disease Unfortunately Dexilant was too expensive.  Adding pantoprazole back on.   Anxiety and depression Currently on Cymbalta.  Increasing to 60 mg.  Continue BuSpar at current strength.  She may continue alprazolam as needed but we discussed trying to cut back some on this.  Referral placed for counseling/therapy.  Chronic fatigue Orders Placed This Encounter  Procedures   Basic Metabolic Panel (BMET)   CBC with Differential   Iron, TIBC and Ferritin Panel   Vitamin D (25 hydroxy)   B12   TSH   Ambulatory referral to Psychology    Referral Priority:   Routine    Referral Type:   Psychiatric    Referral Reason:   Specialty Services Required    Requested Specialty:   Psychology    Number of Visits Requested:   1     Meds ordered this encounter  Medications   DULoxetine (CYMBALTA) 60 MG capsule    Sig: Take 1 capsule (60 mg total) by mouth daily.    Dispense:  90 capsule    Refill:  1   pantoprazole (PROTONIX) 40 MG tablet    Sig: Take 1 tablet (40 mg total) by mouth daily.    Dispense:  90 tablet    Refill:  3    Return in about 3 months (around 07/17/2022) for GAD.    This visit occurred during the SARS-CoV-2 public health emergency.  Safety protocols were in place, including screening questions prior to the visit, additional usage of staff PPE, and extensive cleaning of exam room while observing appropriate contact time as indicated for disinfecting solutions.

## 2022-04-17 LAB — CBC WITH DIFFERENTIAL/PLATELET
Absolute Monocytes: 240 cells/uL (ref 200–950)
Basophils Absolute: 19 cells/uL (ref 0–200)
Basophils Relative: 0.4 %
Eosinophils Absolute: 101 cells/uL (ref 15–500)
Eosinophils Relative: 2.1 %
HCT: 38.4 % (ref 35.0–45.0)
Hemoglobin: 12.9 g/dL (ref 11.7–15.5)
Lymphs Abs: 1992 cells/uL (ref 850–3900)
MCH: 30.1 pg (ref 27.0–33.0)
MCHC: 33.6 g/dL (ref 32.0–36.0)
MCV: 89.7 fL (ref 80.0–100.0)
MPV: 11.2 fL (ref 7.5–12.5)
Monocytes Relative: 5 %
Neutro Abs: 2448 cells/uL (ref 1500–7800)
Neutrophils Relative %: 51 %
Platelets: 245 10*3/uL (ref 140–400)
RBC: 4.28 10*6/uL (ref 3.80–5.10)
RDW: 12.7 % (ref 11.0–15.0)
Total Lymphocyte: 41.5 %
WBC: 4.8 10*3/uL (ref 3.8–10.8)

## 2022-04-17 LAB — IRON,TIBC AND FERRITIN PANEL
%SAT: 27 % (calc) (ref 16–45)
Ferritin: 20 ng/mL (ref 16–232)
Iron: 104 ug/dL (ref 45–160)
TIBC: 386 mcg/dL (calc) (ref 250–450)

## 2022-04-17 LAB — VITAMIN B12: Vitamin B-12: 417 pg/mL (ref 200–1100)

## 2022-04-17 LAB — BASIC METABOLIC PANEL
BUN/Creatinine Ratio: 14 (calc) (ref 6–22)
BUN: 15 mg/dL (ref 7–25)
CO2: 28 mmol/L (ref 20–32)
Calcium: 9.6 mg/dL (ref 8.6–10.4)
Chloride: 93 mmol/L — ABNORMAL LOW (ref 98–110)
Creat: 1.04 mg/dL — ABNORMAL HIGH (ref 0.50–1.03)
Glucose, Bld: 87 mg/dL (ref 65–99)
Potassium: 4.5 mmol/L (ref 3.5–5.3)
Sodium: 130 mmol/L — ABNORMAL LOW (ref 135–146)

## 2022-04-17 LAB — TSH: TSH: 1.28 mIU/L

## 2022-04-17 LAB — VITAMIN D 25 HYDROXY (VIT D DEFICIENCY, FRACTURES): Vit D, 25-Hydroxy: 38 ng/mL (ref 30–100)

## 2022-04-22 ENCOUNTER — Other Ambulatory Visit: Payer: Self-pay

## 2022-04-22 DIAGNOSIS — F411 Generalized anxiety disorder: Secondary | ICD-10-CM

## 2022-04-22 MED ORDER — ALPRAZOLAM 0.5 MG PO TABS: 0.2500 mg | ORAL_TABLET | Freq: Two times a day (BID) | ORAL | 3 refills | Status: DC | PRN

## 2022-04-22 NOTE — Progress Notes (Signed)
Completed.

## 2022-04-26 ENCOUNTER — Encounter: Payer: Self-pay | Admitting: Family Medicine

## 2022-04-30 ENCOUNTER — Ambulatory Visit: Payer: 59

## 2022-05-03 ENCOUNTER — Ambulatory Visit (INDEPENDENT_AMBULATORY_CARE_PROVIDER_SITE_OTHER): Payer: 59 | Admitting: Family Medicine

## 2022-05-03 VITALS — BP 116/65 | HR 88 | Ht 63.0 in | Wt 143.0 lb

## 2022-05-03 DIAGNOSIS — Z23 Encounter for immunization: Secondary | ICD-10-CM | POA: Diagnosis not present

## 2022-05-05 ENCOUNTER — Encounter: Payer: Self-pay | Admitting: Family Medicine

## 2022-05-07 ENCOUNTER — Other Ambulatory Visit (HOSPITAL_BASED_OUTPATIENT_CLINIC_OR_DEPARTMENT_OTHER): Payer: Self-pay | Admitting: Family Medicine

## 2022-05-07 DIAGNOSIS — Z1231 Encounter for screening mammogram for malignant neoplasm of breast: Secondary | ICD-10-CM

## 2022-05-09 ENCOUNTER — Ambulatory Visit (INDEPENDENT_AMBULATORY_CARE_PROVIDER_SITE_OTHER): Payer: 59

## 2022-05-09 DIAGNOSIS — Z1231 Encounter for screening mammogram for malignant neoplasm of breast: Secondary | ICD-10-CM | POA: Diagnosis not present

## 2022-06-06 ENCOUNTER — Encounter: Payer: Self-pay | Admitting: Family Medicine

## 2022-06-06 ENCOUNTER — Other Ambulatory Visit: Payer: Self-pay | Admitting: Family Medicine

## 2022-06-06 MED ORDER — DULOXETINE HCL 60 MG PO CPEP: 60.0000 mg | ORAL_CAPSULE | Freq: Two times a day (BID) | ORAL | 1 refills | Status: DC

## 2022-07-18 ENCOUNTER — Ambulatory Visit: Payer: 59 | Admitting: Family Medicine

## 2022-07-18 ENCOUNTER — Encounter: Payer: Self-pay | Admitting: Family Medicine

## 2022-07-18 VITALS — BP 117/67 | HR 71 | Ht 63.0 in | Wt 141.0 lb

## 2022-07-18 DIAGNOSIS — F411 Generalized anxiety disorder: Secondary | ICD-10-CM | POA: Diagnosis not present

## 2022-07-18 DIAGNOSIS — K219 Gastro-esophageal reflux disease without esophagitis: Secondary | ICD-10-CM | POA: Diagnosis not present

## 2022-07-18 DIAGNOSIS — F410 Panic disorder [episodic paroxysmal anxiety] without agoraphobia: Secondary | ICD-10-CM | POA: Diagnosis not present

## 2022-07-18 DIAGNOSIS — Z23 Encounter for immunization: Secondary | ICD-10-CM | POA: Diagnosis not present

## 2022-07-18 MED ORDER — DULOXETINE HCL 60 MG PO CPEP: 60.0000 mg | ORAL_CAPSULE | Freq: Every day | ORAL | 1 refills | Status: DC

## 2022-07-18 MED ORDER — DULOXETINE HCL 30 MG PO CPEP: 30.0000 mg | ORAL_CAPSULE | Freq: Every day | ORAL | 1 refills | Status: DC

## 2022-07-18 NOTE — Patient Instructions (Addendum)
Let's try duloxetine at 90mg  (60+30mg ) Continue buspar at current strength.  Look into the Calm app.  Continue with regular exercise.  Let's follow up in 3 months.

## 2022-07-18 NOTE — Assessment & Plan Note (Signed)
Well controlled.  Continue pantoprazole at current strength.

## 2022-07-18 NOTE — Assessment & Plan Note (Addendum)
Change duloxetine to 60mg +30mg  to make 90mg  daily.  Continue buspar at current strength.  Try to limit alprazolam use.  Discussed looking into "calm" app.  Continue daily devotionals and regular exercise.  May consider change to pristiq if change in cymbalta is not helpful.

## 2022-07-18 NOTE — Progress Notes (Signed)
Tranisha Tissue - 56 y.o. female MRN [id number]  Date of birth: 02/21/1966  Subjective Chief Complaint  Patient presents with   Anxiety    HPI Kilea Mccarey is a 56 y.o. female here today for follow up of anxiety.    Current treatment with duloxetine, buspar and alprazolam as needed.  She reports that she is having some jittery feeling with current strength of duloxetine.  She has had some continued breakthrough anxiety.  She did not tolerate buspar at 15mg .  She is trying to limit her use of alprazolam but does need this once in a while.  She is continuing to exercise regularly.  She did look into therapy but was too costly to do this at this time.   Also remains on pantoprazole for management of GERD.       07/18/2022   10:31 AM 04/16/2022    1:28 PM 11/01/2021    9:10 AM  Depression screen PHQ 2/9  Decreased Interest 1 1 1   Down, Depressed, Hopeless 1 2 1   PHQ - 2 Score 2 3 2   Altered sleeping 1 1 1   Tired, decreased energy 0 3 3  Change in appetite 1 0 0  Feeling bad or failure about yourself  2 2 1   Trouble concentrating 0 1 1  Moving slowly or fidgety/restless 0 0 0  Suicidal thoughts  0 0  PHQ-9 Score 6 10 8   Difficult doing work/chores Somewhat difficult Somewhat difficult Somewhat difficult      07/18/2022   10:32 AM 04/16/2022    1:28 PM 11/01/2021    9:13 AM 09/20/2021    8:32 AM  GAD 7 : Generalized Anxiety Score  Nervous, Anxious, on Edge 1 3 2 3   Control/stop worrying 2 2 1 3   Worry too much - different things 2 2 1 2   Trouble relaxing 0 2 1 1   Restless 1 0 1 0  Easily annoyed or irritable 2 3 2 3   Afraid - awful might happen 2 2 2 1   Total GAD 7 Score 10 14 10 13   Anxiety Difficulty Somewhat difficult Somewhat difficult Somewhat difficult Somewhat difficult    ROS:  A comprehensive ROS was completed and negative except as noted per HPI   Allergies  Allergen Reactions   Abilify [Aripiprazole]     Panic attacks when coupled with cymbalta    Past  Medical History:  Diagnosis Date   Anxiety    Depression    Hypertension     Past Surgical History:  Procedure Laterality Date   CESAREAN SECTION  01/04/1999    Social History   Socioeconomic History   Marital status: Married    Spouse name: Not on file   Number of children: Not on file   Years of education: Not on file   Highest education level: Not on file  Occupational History   Not on file  Tobacco Use   Smoking status: Never   Smokeless tobacco: Never  Vaping Use   Vaping Use: Never used  Substance and Sexual Activity   Alcohol use: No   Drug use: No   Sexual activity: Yes    Partners: Male    Birth control/protection: None  Other Topics Concern   Not on file  Social History Narrative   Not on file   Social Determinants of Health   Financial Resource Strain: Not on file  Food Insecurity: Not on file  Transportation Needs: Not on file  Physical Activity: Not on file  Stress:  Not on file  Social Connections: Not on file    Family History  Problem Relation Age of Onset   Breast cancer Other        aunts   Diabetes Other        grandmother    Stroke Other        grandmother    Hypertension Father    Hyperlipidemia Father    Asthma Mother    COPD Mother     Health Maintenance  Topic Date Due   INFLUENZA VACCINE  06/11/2022   COVID-19 Vaccine (2 - Janssen risk series) 08/03/2022 (Originally 03/16/2020)   Zoster Vaccines- Shingrix (2 of 2) 12/12/2022 (Originally 06/28/2022)   Hepatitis C Screening  04/17/2023 (Originally 10/15/1986)   MAMMOGRAM  05/09/2024   PAP SMEAR-Modifier  06/14/2024   TETANUS/TDAP  04/23/2027   COLONOSCOPY (Pts 45-22yrs Insurance coverage will need to be confirmed)  05/04/2028   HPV VACCINES  Aged Out   HIV Screening  Discontinued      ----------------------------------------------------------------------------------------------------------------------------------------------------------------------------------------------------------------- Physical Exam BP 117/67 (BP Location: Left Arm, Patient Position: Sitting, Cuff Size: Normal)   Pulse 71   Ht 5\' 3"  (1.6 m)   Wt 141 lb (64 kg)   SpO2 100%   BMI 24.98 kg/m   Physical Exam Constitutional:      Appearance: Normal appearance.  Eyes:     General: No scleral icterus. Cardiovascular:     Rate and Rhythm: Normal rate and regular rhythm.  Pulmonary:     Effort: Pulmonary effort is normal.     Breath sounds: Normal breath sounds.  Musculoskeletal:     Cervical back: Neck supple.  Neurological:     Mental Status: She is alert.  Psychiatric:        Mood and Affect: Mood normal.        Behavior: Behavior normal.     ------------------------------------------------------------------------------------------------------------------------------------------------------------------------------------------------------------------- Assessment and Plan  Panic disorder Change duloxetine to 60mg +30mg  to make 90mg  daily.  Continue buspar at current strength.  Try to limit alprazolam use.  Discussed looking into "calm" app.  Continue daily devotionals and regular exercise.  May consider change to pristiq if change in cymbalta is not helpful.   Gastroesophageal reflux disease Well controlled.  Continue pantoprazole at current strength.    Meds ordered this encounter  Medications   DULoxetine (CYMBALTA) 30 MG capsule    Sig: Take 1 capsule (30 mg total) by mouth daily. Take with 60mg  duloxetine for total of 90mg .    Dispense:  90 capsule    Refill:  1   DULoxetine (CYMBALTA) 60 MG capsule    Sig: Take 1 capsule (60 mg total) by mouth daily.    Dispense:  90 capsule    Refill:  1    Return in about 3 months (around 10/17/2022) for Anxiety.    This visit  occurred during the SARS-CoV-2 public health emergency.  Safety protocols were in place, including screening questions prior to the visit, additional usage of staff PPE, and extensive cleaning of exam room while observing appropriate contact time as indicated for disinfecting solutions.

## 2022-08-15 ENCOUNTER — Ambulatory Visit: Payer: 59

## 2022-10-17 ENCOUNTER — Ambulatory Visit: Payer: 59 | Admitting: Family Medicine

## 2022-10-17 ENCOUNTER — Encounter: Payer: Self-pay | Admitting: Family Medicine

## 2022-10-17 VITALS — BP 125/71 | HR 83 | Ht 63.0 in | Wt 140.0 lb

## 2022-10-17 DIAGNOSIS — J0100 Acute maxillary sinusitis, unspecified: Secondary | ICD-10-CM

## 2022-10-17 DIAGNOSIS — F419 Anxiety disorder, unspecified: Secondary | ICD-10-CM | POA: Diagnosis not present

## 2022-10-17 DIAGNOSIS — F32A Depression, unspecified: Secondary | ICD-10-CM | POA: Diagnosis not present

## 2022-10-17 DIAGNOSIS — J019 Acute sinusitis, unspecified: Secondary | ICD-10-CM | POA: Insufficient documentation

## 2022-10-17 MED ORDER — DOXYCYCLINE HYCLATE 100 MG PO TABS: 100.0000 mg | ORAL_TABLET | Freq: Two times a day (BID) | ORAL | 0 refills | Status: DC

## 2022-10-17 NOTE — Patient Instructions (Signed)
Start doxycycline for sinuses.  Add flonase daily.  Use humidifier and/or nasal saline.

## 2022-10-17 NOTE — Assessment & Plan Note (Signed)
And course of doxycycline.  Recommend continued supportive care with increase fluids.  Nasal saline rinses and/or Flonase recommended as well for congestion.

## 2022-10-17 NOTE — Assessment & Plan Note (Signed)
Fairly stable at this time.  Continue duloxetine and BuSpar at current strength.  We discussed trying to limit alprazolam as much as possible.

## 2022-10-17 NOTE — Progress Notes (Signed)
Inella Kuwahara - 56 y.o. female MRN [id number]  Date of birth: Jan 23, 1966  Subjective Chief Complaint  Patient presents with   Anxiety    HPI Lakyra is a 56 year old female here today for follow-up visit.  She reports she is doing okay.  She reports that increase in duloxetine was not really made much of a difference in her anxiety.  BuSpar seems to be effective.  She does continue to use alprazolam as needed.  No issues with tolerance for medications.  She has had increased sinus congestion and pressure.  Sore throat with postnasal drainage.  She has had this for about 3 weeks.  Denies fever or chills.  She has been using allergy medication without much improvement.  Reflux remains stable with Protonix at current strength.  ROS:  A comprehensive ROS was completed and negative except as noted per HPI  Allergies  Allergen Reactions   Abilify [Aripiprazole]     Panic attacks when coupled with cymbalta    Past Medical History:  Diagnosis Date   Anxiety    Depression    Hypertension     Past Surgical History:  Procedure Laterality Date   CESAREAN SECTION  01/04/1999    Social History   Socioeconomic History   Marital status: Married    Spouse name: Not on file   Number of children: Not on file   Years of education: Not on file   Highest education level: Not on file  Occupational History   Not on file  Tobacco Use   Smoking status: Never   Smokeless tobacco: Never  Vaping Use   Vaping Use: Never used  Substance and Sexual Activity   Alcohol use: No   Drug use: No   Sexual activity: Yes    Partners: Male    Birth control/protection: None  Other Topics Concern   Not on file  Social History Narrative   Not on file   Social Determinants of Health   Financial Resource Strain: Not on file  Food Insecurity: Not on file  Transportation Needs: Not on file  Physical Activity: Not on file  Stress: Not on file  Social Connections: Not on file    Family History   Problem Relation Age of Onset   Breast cancer Other        aunts   Diabetes Other        grandmother    Stroke Other        grandmother    Hypertension Father    Hyperlipidemia Father    Asthma Mother    COPD Mother     Health Maintenance  Topic Date Due   COVID-19 Vaccine (2 - Janssen risk series) 12/12/2022 (Originally 03/16/2020)   Zoster Vaccines- Shingrix (2 of 2) 12/12/2022 (Originally 06/28/2022)   Hepatitis C Screening  04/17/2023 (Originally 10/15/1986)   MAMMOGRAM  05/09/2024   PAP SMEAR-Modifier  06/14/2024   DTaP/Tdap/Td (2 - Td or Tdap) 04/23/2027   COLONOSCOPY (Pts 45-60yrs Insurance coverage will need to be confirmed)  05/04/2028   INFLUENZA VACCINE  Completed   HPV VACCINES  Aged Out   HIV Screening  Discontinued     ----------------------------------------------------------------------------------------------------------------------------------------------------------------------------------------------------------------- Physical Exam BP 125/71 (BP Location: Left Arm, Patient Position: Sitting, Cuff Size: Normal)   Pulse 83   Ht 5\' 3"  (1.6 m)   Wt 140 lb (63.5 kg)   SpO2 100%   BMI 24.80 kg/m   Physical Exam Constitutional:      Appearance: Normal appearance.  HENT:  Head: Normocephalic and atraumatic.  Eyes:     General: No scleral icterus. Cardiovascular:     Rate and Rhythm: Normal rate and regular rhythm.  Pulmonary:     Effort: Pulmonary effort is normal.     Breath sounds: Normal breath sounds.  Musculoskeletal:     Cervical back: Neck supple.  Neurological:     Mental Status: She is alert.  Psychiatric:        Mood and Affect: Mood normal.        Behavior: Behavior normal.     ------------------------------------------------------------------------------------------------------------------------------------------------------------------------------------------------------------------- Assessment and Plan  Anxiety and  depression Fairly stable at this time.  Continue duloxetine and BuSpar at current strength.  We discussed trying to limit alprazolam as much as possible.  Acute sinusitis And course of doxycycline.  Recommend continued supportive care with increase fluids.  Nasal saline rinses and/or Flonase recommended as well for congestion.   Meds ordered this encounter  Medications   doxycycline (VIBRA-TABS) 100 MG tablet    Sig: Take 1 tablet (100 mg total) by mouth 2 (two) times daily.    Dispense:  20 tablet    Refill:  0    Return in about 6 months (around 04/18/2023) for mood f/u .    This visit occurred during the SARS-CoV-2 public health emergency.  Safety protocols were in place, including screening questions prior to the visit, additional usage of staff PPE, and extensive cleaning of exam room while observing appropriate contact time as indicated for disinfecting solutions.

## 2022-10-30 ENCOUNTER — Other Ambulatory Visit: Payer: Self-pay | Admitting: Family Medicine

## 2022-10-30 ENCOUNTER — Encounter: Payer: Self-pay | Admitting: Family Medicine

## 2022-10-30 DIAGNOSIS — F411 Generalized anxiety disorder: Secondary | ICD-10-CM

## 2022-10-30 DIAGNOSIS — F410 Panic disorder [episodic paroxysmal anxiety] without agoraphobia: Secondary | ICD-10-CM

## 2022-10-30 NOTE — Telephone Encounter (Signed)
Last fill 04/22/22 last visit 10/17/22

## 2022-12-11 ENCOUNTER — Other Ambulatory Visit: Payer: Self-pay | Admitting: Family Medicine

## 2023-01-10 ENCOUNTER — Other Ambulatory Visit: Payer: Self-pay | Admitting: Family Medicine

## 2023-04-18 ENCOUNTER — Encounter: Payer: Self-pay | Admitting: Family Medicine

## 2023-04-18 ENCOUNTER — Ambulatory Visit: Payer: 59 | Admitting: Family Medicine

## 2023-04-18 VITALS — BP 123/68 | HR 72 | Ht 63.0 in | Wt 140.0 lb

## 2023-04-18 DIAGNOSIS — L603 Nail dystrophy: Secondary | ICD-10-CM | POA: Diagnosis not present

## 2023-04-18 DIAGNOSIS — F411 Generalized anxiety disorder: Secondary | ICD-10-CM

## 2023-04-18 DIAGNOSIS — F410 Panic disorder [episodic paroxysmal anxiety] without agoraphobia: Secondary | ICD-10-CM

## 2023-04-18 DIAGNOSIS — F419 Anxiety disorder, unspecified: Secondary | ICD-10-CM

## 2023-04-18 DIAGNOSIS — M79671 Pain in right foot: Secondary | ICD-10-CM

## 2023-04-18 DIAGNOSIS — F32A Depression, unspecified: Secondary | ICD-10-CM

## 2023-04-18 MED ORDER — BUSPIRONE HCL 15 MG PO TABS: 15.0000 mg | ORAL_TABLET | Freq: Three times a day (TID) | ORAL | 1 refills | Status: DC

## 2023-04-18 MED ORDER — DULOXETINE HCL 60 MG PO CPEP: 60.0000 mg | ORAL_CAPSULE | Freq: Two times a day (BID) | ORAL | 1 refills | Status: DC

## 2023-04-18 MED ORDER — ALPRAZOLAM 0.5 MG PO TABS: ORAL_TABLET | ORAL | 3 refills | Status: DC

## 2023-04-18 MED ORDER — PANTOPRAZOLE SODIUM 40 MG PO TBEC: 40.0000 mg | DELAYED_RELEASE_TABLET | Freq: Every day | ORAL | 3 refills | Status: DC

## 2023-04-18 MED ORDER — DULOXETINE HCL 30 MG PO CPEP: 30.0000 mg | ORAL_CAPSULE | Freq: Every day | ORAL | 1 refills | Status: DC

## 2023-04-20 DIAGNOSIS — M79671 Pain in right foot: Secondary | ICD-10-CM | POA: Insufficient documentation

## 2023-04-20 NOTE — Assessment & Plan Note (Signed)
Depressive symptoms are well-controlled with duloxetine at current strength.  She does continue to have some anxiety.  Will see if BuSpar 15 mg twice per day is more effective for her than the 10 mg strength.  She may continue alprazolam as needed but try to limit this as much as possible.

## 2023-04-20 NOTE — Progress Notes (Signed)
Rachael [name] - 57 y.o. female MRN [id number]  Date of birth: 01-24-1966  Subjective Chief Complaint  Patient presents with   Anxiety   Foot Pain    HPI Rachael [name] is a 57 y.o. female here today for follow-up visit.  She continues on duloxetine 90 mg daily.  She is tolerating this pretty well.  She feels that this is working okay for her.  She does have continued anxiety.  Feels like she is needing alprazolam little more often recently.  She does continue on BuSpar.  Tried taking 3 times daily but this did not seem to make a big difference.  This did work pretty well for her when she initially started.  She does report damaging a toenail previously.  Nail has not grown back out correctly.  She does also have some foot pain that she previously had orthotics to help with.  She would like to see podiatry.  ROS:  A comprehensive ROS was completed and negative except as noted per HPI  Allergies  Allergen Reactions   Abilify [Aripiprazole]     Panic attacks when coupled with cymbalta   Shingrix [Zoster Vac Recomb Adjuvanted] Hives    Past Medical History:  Diagnosis Date   Anxiety    Depression    Hypertension     Past Surgical History:  Procedure Laterality Date   CESAREAN SECTION  01/04/1999    Social History   Socioeconomic History   Marital status: Married    Spouse name: Not on file   Number of children: Not on file   Years of education: Not on file   Highest education level: Not on file  Occupational History   Not on file  Tobacco Use   Smoking status: Never   Smokeless tobacco: Never  Vaping Use   Vaping Use: Never used  Substance and Sexual Activity   Alcohol use: No   Drug use: No   Sexual activity: Yes    Partners: Male    Birth control/protection: None  Other Topics Concern   Not on file  Social History Narrative   Not on file   Social Determinants of Health   Financial Resource Strain: Not on file  Food Insecurity: Not on file   Transportation Needs: Not on file  Physical Activity: Not on file  Stress: Not on file  Social Connections: Not on file    Family History  Problem Relation Age of Onset   Breast cancer Other        aunts   Diabetes Other        grandmother    Stroke Other        grandmother    Hypertension Father    Hyperlipidemia Father    Asthma Mother    COPD Mother     Health Maintenance  Topic Date Due   COVID-19 Vaccine (2 - Janssen risk series) 07/04/2023 (Originally 03/16/2020)   Hepatitis C Screening  04/17/2024 (Originally 10/15/1986)   INFLUENZA VACCINE  06/12/2023   MAMMOGRAM  05/09/2024   PAP SMEAR-Modifier  06/14/2024   DTaP/Tdap/Td (2 - Td or Tdap) 04/23/2027   Colonoscopy  05/04/2028   HPV VACCINES  Aged Out   HIV Screening  Discontinued   Zoster Vaccines- Shingrix  Discontinued     ----------------------------------------------------------------------------------------------------------------------------------------------------------------------------------------------------------------- Physical Exam BP 123/68 (BP Location: Left Arm, Patient Position: Sitting, Cuff Size: Normal)   Pulse 72   Ht 5\' 3"  (1.6 m)   Wt 140 lb (63.5 kg)   SpO2 100%  BMI 24.80 kg/m   Physical Exam Constitutional:      Appearance: Normal appearance.  HENT:     Head: Normocephalic and atraumatic.  Eyes:     General: No scleral icterus. Cardiovascular:     Rate and Rhythm: Normal rate and regular rhythm.  Pulmonary:     Effort: Pulmonary effort is normal.     Breath sounds: Normal breath sounds.  Musculoskeletal:     Cervical back: Normal range of motion and neck supple.  Neurological:     Mental Status: She is alert.  Psychiatric:        Mood and Affect: Mood normal.        Behavior: Behavior normal.      ------------------------------------------------------------------------------------------------------------------------------------------------------------------------------------------------------------------- Assessment and Plan  Anxiety and depression Depressive symptoms are well-controlled with duloxetine at current strength.  She does continue to have some anxiety.  Will see if BuSpar 15 mg twice per day is more effective for her than the 10 mg strength.  She may continue alprazolam as needed but try to limit this as much as possible.  Right foot pain She previously had orthotics.  She would like to see podiatry, referral entered.   Meds ordered this encounter  Medications   busPIRone (BUSPAR) 15 MG tablet    Sig: Take 1 tablet (15 mg total) by mouth 3 (three) times daily.    Dispense:  270 tablet    Refill:  1   DULoxetine (CYMBALTA) 30 MG capsule    Sig: Take 1 capsule (30 mg total) by mouth daily. Take with 60mg  duloxetine for total of 90mg .    Dispense:  90 capsule    Refill:  1   DULoxetine (CYMBALTA) 60 MG capsule    Sig: Take 1 capsule (60 mg total) by mouth 2 (two) times daily.    Dispense:  180 capsule    Refill:  1   ALPRAZolam (XANAX) 0.5 MG tablet    Sig: TAKE 1/2 - 1 TABLET (0.25 - 0.5 MG TOTAL) BY MOUTH TWICE A DAY AS NEEDED FOR ANXIETY    Dispense:  20 tablet    Refill:  3    This request is for a new prescription for a controlled substance as required by Federal/State law.   pantoprazole (PROTONIX) 40 MG tablet    Sig: Take 1 tablet (40 mg total) by mouth daily.    Dispense:  90 tablet    Refill:  3    Return in about 6 months (around 10/18/2023) for Anxiety.    This visit occurred during the SARS-CoV-2 public health emergency.  Safety protocols were in place, including screening questions prior to the visit, additional usage of staff PPE, and extensive cleaning of exam room while observing appropriate contact time as indicated for disinfecting  solutions.

## 2023-04-20 NOTE — Assessment & Plan Note (Signed)
She previously had orthotics.  She would like to see podiatry, referral entered.

## 2023-04-25 ENCOUNTER — Ambulatory Visit (INDEPENDENT_AMBULATORY_CARE_PROVIDER_SITE_OTHER): Payer: 59

## 2023-04-25 ENCOUNTER — Ambulatory Visit: Payer: 59 | Admitting: Podiatry

## 2023-04-25 ENCOUNTER — Encounter: Payer: Self-pay | Admitting: Podiatry

## 2023-04-25 DIAGNOSIS — M79671 Pain in right foot: Secondary | ICD-10-CM | POA: Diagnosis not present

## 2023-04-25 DIAGNOSIS — M205X1 Other deformities of toe(s) (acquired), right foot: Secondary | ICD-10-CM | POA: Diagnosis not present

## 2023-04-25 DIAGNOSIS — L603 Nail dystrophy: Secondary | ICD-10-CM | POA: Diagnosis not present

## 2023-04-25 MED ORDER — MELOXICAM 15 MG PO TABS: 15.0000 mg | ORAL_TABLET | Freq: Every day | ORAL | 0 refills | Status: DC

## 2023-04-25 NOTE — Progress Notes (Signed)
  Subjective:  Patient ID: Rachael [name], female    DOB: 02/18/1966,   MRN: [id number]  Chief Complaint  Patient presents with   Nail Problem    Right great toe nail some discoloration    Foot Pain    Right foot pain    57 y.o. female presents for concern of dystrophic toenail as well as right foot pain .Relates the nail has been like this for a year. Did have some damage previous and never imrpoved. Has tried OTC fungal treatments with no change. Also relates years of right foot pain mostly in the big toe joint and tracking up the foot. Also some pain in the heel. Very active and works out a lot  Denies any other pedal complaints. Denies n/v/f/c.   Past Medical History:  Diagnosis Date   Anxiety    Depression    Hypertension     Objective:  Physical Exam: Vascular: DP/PT pulses 2/4 bilateral. CFT <3 seconds. Normal hair growth on digits. No edema.  Skin. No lacerations or abrasions bilateral feet. Right hallux nail thickened and discolored medially with subungual debris Musculoskeletal: MMT 5/5 bilateral lower extremities in DF, PF, Inversion and Eversion. Deceased ROM in DF of ankle joint. Right first MPJ tender to palpation dorsally. Diminished ROM and pain on end ROM. No pain proximally or around heel.  Neurological: Sensation intact to light touch.   Assessment:   1. Hallux limitus, right   2. Onychodystrophy      Plan:  Patient was evaluated and treated and all questions answered. -Xrays reviewed. Joint space lose noted to first MPJ on right with moderate degenerative changes noted.  -Discussed hallux limitus and  treatement options; conservative and  Surgical management; risks, benefits, alternatives discussed. All patient's questions answered. -Rx Meloxicam provided. BUN normal Cr slightly elevated about a year ago.  -Patient deferred injection.   -Recommend continue with good supportive shoes and inserts. Discussed stiff soled shoes and use of carbon fiber foot  plate.   -Examined patient -Discussed treatment options for painful dystrophic nails  -Clinical picture and Fungal culture was obtained by removing a portion of the hard nail itself from each of the involved toenails using a sterile nail nipper and sent to [facility] lab. Patient tolerated the biopsy procedure well without discomfort or need for anesthesia.  -Discussed fungal nail treatment options including oral, topical, and laser treatments.  -Patient to return in 4 weeks for follow up evaluation and discussion of fungal culture results or sooner if symptoms worsen.    Louann Sjogren, DPM

## 2023-05-25 ENCOUNTER — Other Ambulatory Visit: Payer: Self-pay | Admitting: Podiatry

## 2023-05-29 ENCOUNTER — Encounter: Payer: Self-pay | Admitting: Podiatry

## 2023-05-29 ENCOUNTER — Ambulatory Visit: Payer: 59 | Admitting: Podiatry

## 2023-05-29 DIAGNOSIS — L603 Nail dystrophy: Secondary | ICD-10-CM | POA: Diagnosis not present

## 2023-05-29 DIAGNOSIS — M205X1 Other deformities of toe(s) (acquired), right foot: Secondary | ICD-10-CM | POA: Diagnosis not present

## 2023-05-29 NOTE — Progress Notes (Signed)
  Subjective:  Patient ID: Rachael [name], female    DOB: 1965-11-24,   MRN: [id number]  Chief Complaint  Patient presents with   Follow-up    Right foot pain. Continues to have pain to her right foot. She is currently taking the meloxicam as ordered.    Results    Nail biopsy results.     57 y.o. female presents for follow-up of fungal nails and to discuss culture results as well as follow-up fo rhallux limitus.  Denies any other pedal complaints. Denies n/v/f/c.   Past Medical History:  Diagnosis Date   Anxiety    Depression    Hypertension     Objective:  Physical Exam: Vascular: DP/PT pulses 2/4 bilateral. CFT <3 seconds. Normal hair growth on digits. No edema.  Skin. No lacerations or abrasions bilateral feet. Right hallux nail thickened and discolored medially with subungual debris Musculoskeletal: MMT 5/5 bilateral lower extremities in DF, PF, Inversion and Eversion. Deceased ROM in DF of ankle joint. Right first MPJ tender to palpation dorsally. Diminished ROM and pain on end ROM. No pain proximally or around heel.  Neurological: Sensation intact to light touch.   Assessment:   1. Hallux limitus, right   2. Onychodystrophy       Plan:  Patient was evaluated and treated and all questions answered. -Xrays reviewed. Joint space lose noted to first MPJ on right with moderate degenerative changes noted.  -Discussed hallux limitus and  treatement options; conservative and  Surgical management; risks, benefits, alternatives discussed. All patient's questions answered. -continue meloxicam.  -Patient deferred injection.   -Recommend continue with good supportive shoes and inserts. Discussed stiff soled shoes and use of carbon fiber foot plate.   -Examined patient -Discussed treatment options for painful dystrophic nails  -Culture results positive for fungus.  -Discussed fungal nail treatment options including oral, topical, and laser treatments.  -Will discuss with  husband and call with what treatments she wants to go with.  -Patient to return in 3 months for recheck.     Louann Sjogren, DPM

## 2023-05-30 MED ORDER — CICLOPIROX 8 % EX SOLN: Freq: Every day | CUTANEOUS | 0 refills | Status: DC

## 2023-06-09 ENCOUNTER — Encounter: Payer: Self-pay | Admitting: Family Medicine

## 2023-06-09 ENCOUNTER — Other Ambulatory Visit: Payer: Self-pay | Admitting: Family Medicine

## 2023-06-09 ENCOUNTER — Ambulatory Visit (INDEPENDENT_AMBULATORY_CARE_PROVIDER_SITE_OTHER): Payer: 59

## 2023-06-09 ENCOUNTER — Ambulatory Visit: Payer: 59 | Admitting: Family Medicine

## 2023-06-09 VITALS — BP 122/75 | HR 72 | Ht 63.0 in | Wt 143.0 lb

## 2023-06-09 DIAGNOSIS — Z1231 Encounter for screening mammogram for malignant neoplasm of breast: Secondary | ICD-10-CM

## 2023-06-09 DIAGNOSIS — M25532 Pain in left wrist: Secondary | ICD-10-CM | POA: Diagnosis not present

## 2023-06-09 NOTE — Assessment & Plan Note (Addendum)
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  Conservative management with thumb spica brace, prescribed for her today.  Continue meloxicam.  May use Voltaren gel and ice to area as well.  If refractory to conservative management will plan to have her see Dr. Benjamin Stain.

## 2023-06-09 NOTE — Patient Instructions (Signed)
De Quervain's Tenosynovitis  De Quervain's tenosynovitis is a condition that causes inflammation of the tendon on the thumb side of the wrist. Tendons are cords of tissue that connect bones to muscles. The tendons in the hand pass through a tunnel called a sheath. A slippery layer of tissue (synovium) lets the tendons move smoothly in the sheath. With de Quervain's tenosynovitis, the sheath swells or thickens, causing friction and pain. The condition is also called de Quervain's disease and de Quervain's syndrome. It occurs most often in women who are 67-8 years old. What are the causes? The exact cause of this condition is not known. It may be associated with overuse of the hand and wrist. What increases the risk? You are more likely to develop this condition if you: Use your hands far more than normal, especially if you repeat certain movements that involve twisting your hand or using a tight grip. Are pregnant. Are a middle-aged woman. Have rheumatoid arthritis. Have diabetes. What are the signs or symptoms? The main symptom of this condition is pain on the thumb side of the wrist. The pain may get worse when you grasp something or turn your wrist. Other symptoms may include: Pain that extends up the forearm. Swelling of your wrist and hand. Trouble moving the thumb and wrist. A sensation of snapping in the wrist. A bump filled with fluid (cyst) in the area of the pain. How is this diagnosed? This condition may be diagnosed based on: Your symptoms and medical history. A physical exam. During the exam, your health care provider may do a simple test Rachael [name] test) that involves pulling your thumb and wrist to see if this causes pain. You may also need to have an X-ray or ultrasound. How is this treated? Treatment for this condition may include: Avoiding any activity that causes pain and swelling. Taking medicines. Anti-inflammatory medicines and corticosteroid injections may be used  to reduce inflammation and relieve pain. Wearing a splint. Having surgery. This may be needed if other treatments do not work. Once the pain and swelling have gone down, you may start: Physical therapy. This includes exercises to improve movement and strength in your wrist and thumb. Occupational therapy. This includes adjusting how you move your wrist. Follow these instructions at home: If you have a splint: Wear the splint as told by your health care provider. Remove it only as told by your health care provider. Loosen the splint if your fingers tingle, become numb, or turn cold and blue. Keep the splint clean. If the splint is not waterproof: Do not let it get wet. Cover it with a watertight covering when you take a bath or a shower. Managing pain, stiffness, and swelling  Avoid movements and activities that cause pain and swelling in the wrist area. If directed, put ice on the painful area. This may be helpful after doing activities that involve the sore wrist. To do this: Put ice in a plastic bag. Place a towel between your skin and the bag. Leave the ice on for 20 minutes, 2-3 times a day. Remove the ice if your skin turns bright red. This is very important. If you cannot feel pain, heat, or cold, you have a greater risk of damage to the area. Move your fingers often to reduce stiffness and swelling. Raise (elevate) the injured area above the level of your heart while you are sitting or lying down. General instructions Return to your normal activities as told by your health care provider. Ask your  health care provider what activities are safe for you. Take over-the-counter and prescription medicines only as told by your health care provider. Keep all follow-up visits. This is important. Contact a health care provider if: Your pain medicine does not help. Your pain gets worse. You develop new symptoms. Summary De Quervain's tenosynovitis is a condition that causes inflammation  of the tendon on the thumb side of the wrist. The condition occurs most often in women who are 1-83 years old. The exact cause of this condition is not known. It may be associated with overuse of the hand and wrist. Treatment starts with avoiding activity that causes pain or swelling in the wrist area. Other treatments may include wearing a splint and taking medicine. Sometimes, surgery is needed. This information is not intended to replace advice given to you by your health care provider. Make sure you discuss any questions you have with your health care provider. Document Revised: 02/08/2020 Document Reviewed: 02/09/2020 Elsevier Patient Education  2024 [employer].

## 2023-06-09 NOTE — Progress Notes (Signed)
Rachael [name] - 57 y.o. female MRN [id number]  Date of birth: [birth year]  Subjective Chief Complaint  Patient presents with   Wrist Pain    HPI Rachael [name] is a 57 y.o. female here today with complaint of  L wrist pain.   This started about a month ago.  She notices that she has more symptoms when exercising and doing things where she extends the wrist .  She describes a catching sensation at times over the radial wrist and thumb.  She denies swelling, warmth or redness of the joint.  The joint does feel stiff.   So far she has tried NSAID (topical and PO) and generic wrist brace.    ROS:  A comprehensive ROS was completed and negative except as noted per HPI  Allergies  Allergen Reactions   Abilify [Aripiprazole]     Panic attacks when coupled with cymbalta   Shingrix [Zoster Vac Recomb Adjuvanted] Hives    Past Medical History:  Diagnosis Date   Anxiety    Depression    Hypertension     Past Surgical History:  Procedure Laterality Date   CESAREAN SECTION  01/04/1999    Social History   Socioeconomic History   Marital status: Married    Spouse name: Not on file   Number of children: Not on file   Years of education: Not on file   Highest education level: Not on file  Occupational History   Not on file  Tobacco Use   Smoking status: Never   Smokeless tobacco: Never  Vaping Use   Vaping status: Never Used  Substance and Sexual Activity   Alcohol use: No   Drug use: No   Sexual activity: Yes    Partners: Male    Birth control/protection: None  Other Topics Concern   Not on file  Social History Narrative   Not on file   Social Determinants of Health   Financial Resource Strain: Not on file  Food Insecurity: Not on file  Transportation Needs: Not on file  Physical Activity: Not on file  Stress: Not on file  Social Connections: Unknown (03/23/2023)   Received from [facility]   Social Network    Social Network: Not on file     Family History  Problem Relation Age of Onset   Breast cancer Other        aunts   Diabetes Other        grandmother    Stroke Other        grandmother    Hypertension Father    Hyperlipidemia Father    Asthma Mother    COPD Mother     Health Maintenance  Topic Date Due   COVID-19 Vaccine (2 - Janssen risk series) 07/04/2023 (Originally 03/16/2020)   Hepatitis C Screening  04/17/2024 (Originally 10/15/1986)   INFLUENZA VACCINE  06/12/2023   MAMMOGRAM  05/09/2024   PAP SMEAR-Modifier  06/14/2024   DTaP/Tdap/Td (2 - Td or Tdap) 04/23/2027   Colonoscopy  05/04/2028   HPV VACCINES  Aged Out   HIV Screening  Discontinued   Zoster Vaccines- Shingrix  Discontinued     ----------------------------------------------------------------------------------------------------------------------------------------------------------------------------------------------------------------- Physical Exam BP 122/75 (BP Location: Left Arm, Patient Position: Sitting, Cuff Size: Normal)   Pulse 72   Ht 5\' 3"  (1.6 m)   Wt 143 lb (64.9 kg)   LMP 03/25/2017 (Approximate)   SpO2 100%   BMI 25.33 kg/m   Physical Exam Constitutional:      Appearance: Normal  appearance.  Eyes:     General: No scleral icterus. Musculoskeletal:     Comments: She has TTP along the L radius.  Increase pain with extension and radial deviation.  Positive finkelstein.  Also with some TTP along the base of the thumb but more tenderness along the APL tendon  Neurological:     General: No focal deficit present.     Mental Status: She is alert.  Psychiatric:        Mood and Affect: Mood normal.        Behavior: Behavior normal.     ------------------------------------------------------------------------------------------------------------------------------------------------------------------------------------------------------------------- Assessment and Plan  Left wrist pain De Quervain vs Intersection syndrome as  she does have some proximal symptoms as well.  Conservative management with thumb spica brace, prescribed for her today.  Continue meloxicam.  May use Voltaren gel and ice to area as well.  If refractory to conservative management will plan to have her see Dr. Benjamin Stain.    No orders of the defined types were placed in this encounter.   No follow-ups on file.    This visit occurred during the SARS-CoV-2 public health emergency.  Safety protocols were in place, including screening questions prior to the visit, additional usage of staff PPE, and extensive cleaning of exam room while observing appropriate contact time as indicated for disinfecting solutions.

## 2023-06-23 ENCOUNTER — Other Ambulatory Visit: Payer: Self-pay | Admitting: Podiatry

## 2023-07-16 ENCOUNTER — Ambulatory Visit (INDEPENDENT_AMBULATORY_CARE_PROVIDER_SITE_OTHER): Payer: 59

## 2023-07-16 DIAGNOSIS — Z1231 Encounter for screening mammogram for malignant neoplasm of breast: Secondary | ICD-10-CM

## 2023-07-26 ENCOUNTER — Other Ambulatory Visit: Payer: Self-pay | Admitting: Podiatry

## 2023-08-04 ENCOUNTER — Encounter: Payer: Self-pay | Admitting: Podiatry

## 2023-08-28 ENCOUNTER — Ambulatory Visit: Payer: 59 | Admitting: Podiatry

## 2023-08-28 ENCOUNTER — Encounter: Payer: Self-pay | Admitting: Podiatry

## 2023-08-28 DIAGNOSIS — B351 Tinea unguium: Secondary | ICD-10-CM | POA: Diagnosis not present

## 2023-08-28 MED ORDER — CICLOPIROX 8 % EX SOLN: Freq: Every day | CUTANEOUS | 0 refills | Status: DC

## 2023-08-28 NOTE — Progress Notes (Signed)
  Subjective:  Patient ID: Rachael [name], female    DOB: 01/10/1966,   MRN: [id number]  No chief complaint on file.   57 y.o. female presents for follow-up of fungal nails relates she is maybe having some improvement. The toe has been starting to hurt. .  Denies any other pedal complaints. Denies n/v/f/c.   Past Medical History:  Diagnosis Date   Anxiety    Depression    Hypertension     Objective:  Physical Exam: Vascular: DP/PT pulses 2/4 bilateral. CFT <3 seconds. Normal hair growth on digits. No edema.  Skin. No lacerations or abrasions bilateral feet. Right hallux nail thickened and discolored medially with subungual debris tracking to just 1/8 proximal nail fold.  Musculoskeletal: MMT 5/5 bilateral lower extremities in DF, PF, Inversion and Eversion. Deceased ROM in DF of ankle joint. Right first MPJ tender to palpation dorsally. Diminished ROM and pain on end ROM. No pain proximally or around heel.  Neurological: Sensation intact to light touch.   Assessment:   1. Onychomycosis        Plan:  Patient was evaluated and treated and all questions answered. -Xrays reviewed. Joint space lose noted to first MPJ on right with moderate degenerative changes noted.  -Discussed hallux limitus and  treatement options; conservative and  Surgical management; risks, benefits, alternatives discussed. All patient's questions answered. -continue meloxicam.  -Patient deferred injection.   -Recommend continue with good supportive shoes and inserts. Discussed stiff soled shoes and use of carbon fiber foot plate.   -Examined patient -Discussed treatment options for painful dystrophic nails  -Culture results positive for fungus.  -Discussed fungal nail treatment options including oral, topical, and laser treatments.  -Will continue with penlac refill sent. Toenail debrided as courtesy.  -Patient to return in 3 months for recheck.     Louann Sjogren, DPM

## 2023-09-01 ENCOUNTER — Other Ambulatory Visit: Payer: Self-pay | Admitting: Podiatry

## 2023-09-25 ENCOUNTER — Ambulatory Visit: Payer: 59 | Admitting: Family Medicine

## 2023-09-25 ENCOUNTER — Ambulatory Visit: Payer: 59

## 2023-09-25 VITALS — BP 138/79 | HR 73 | Temp 98.0°F | Ht 63.0 in | Wt 144.0 lb

## 2023-09-25 DIAGNOSIS — R051 Acute cough: Secondary | ICD-10-CM

## 2023-09-25 DIAGNOSIS — R059 Cough, unspecified: Secondary | ICD-10-CM | POA: Insufficient documentation

## 2023-09-25 MED ORDER — AZITHROMYCIN 250 MG PO TABS: ORAL_TABLET | ORAL | 0 refills | Status: AC

## 2023-09-25 MED ORDER — BENZONATATE 200 MG PO CAPS: 200.0000 mg | ORAL_CAPSULE | Freq: Two times a day (BID) | ORAL | 0 refills | Status: DC | PRN

## 2023-09-25 NOTE — Assessment & Plan Note (Addendum)
Cough with some chest tightness.  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  Some scattered rhonchi on exam.  Chest x-ray ordered.  Will go ahead and cover empirically with azithromycin.  Tessalon as needed for cough.  Precautions and red flags reviewed.

## 2023-09-25 NOTE — Progress Notes (Signed)
Rachael [name] - 57 y.o. female MRN [id number]  Date of birth: 28-Jun-1966  Subjective Chief Complaint  Patient presents with   Cough    HPI Rachael [name] is a 57 year old female here today with complaint of cough.  She also complains of some chest tightness with breathing.  She denies chest pain..  She has not had fever or chills.  Denies congestion.  She does teach exercise classes and does feel little winded with this at times.  ROS:  A comprehensive ROS was completed and negative except as noted per HPI  Allergies  Allergen Reactions   Abilify [Aripiprazole]     Panic attacks when coupled with cymbalta   Shingrix [Zoster Vac Recomb Adjuvanted] Hives    Past Medical History:  Diagnosis Date   Anxiety    Depression    Hypertension     Past Surgical History:  Procedure Laterality Date   CESAREAN SECTION  01/04/1999    Social History   Socioeconomic History   Marital status: Married    Spouse name: Not on file   Number of children: Not on file   Years of education: Not on file   Highest education level: Associate degree: academic program  Occupational History   Not on file  Tobacco Use   Smoking status: Never   Smokeless tobacco: Never  Vaping Use   Vaping status: Never Used  Substance and Sexual Activity   Alcohol use: No   Drug use: No   Sexual activity: Yes    Partners: Male    Birth control/protection: None  Other Topics Concern   Not on file  Social History Narrative   Not on file   Social Determinants of Health   Financial Resource Strain: Low Risk  (09/25/2023)   Overall Financial Resource Strain (CARDIA)    Difficulty of Paying Living Expenses: Not very hard  Food Insecurity: Unknown (09/25/2023)   Hunger Vital Sign    Worried About Running Out of Food in the Last Year: Never true    Ran Out of Food in the Last Year: Patient declined  Transportation Needs: Patient Declined (09/25/2023)   PRAPARE - Transportation    Lack of Transportation  (Medical): Patient declined    Lack of Transportation (Non-Medical): Patient declined  Physical Activity: Sufficiently Active (09/25/2023)   Exercise Vital Sign    Days of Exercise per Week: 5 days    Minutes of Exercise per Session: 50 min  Stress: Stress Concern Present (09/25/2023)   [employer] of Occupational Health - Occupational Stress Questionnaire    Feeling of Stress : To some extent  Social Connections: Socially Integrated (09/25/2023)   Social Connection and Isolation Panel [NHANES]    Frequency of Communication with Friends and Family: More than three times a week    Frequency of Social Gatherings with Friends and Family: Twice a week    Attends Religious Services: More than 4 times per year    Active Member of [employer] or Organizations: Yes    Attends Engineer, structural: More than 4 times per year    Marital Status: Married    Family History  Problem Relation Age of Onset   Breast cancer Other        aunts   Diabetes Other        grandmother    Stroke Other        grandmother    Hypertension Father    Hyperlipidemia Father    Asthma Mother    COPD Mother  Health Maintenance  Topic Date Due   COVID-19 Vaccine (2 - Janssen risk series) 03/16/2020   Hepatitis C Screening  04/17/2024 (Originally 10/15/1986)   Cervical Cancer Screening (HPV/Pap Cotest)  06/14/2024   MAMMOGRAM  07/15/2025   DTaP/Tdap/Td (2 - Td or Tdap) 04/23/2027   Colonoscopy  05/04/2028   INFLUENZA VACCINE  Completed   HPV VACCINES  Aged Out   HIV Screening  Discontinued   Zoster Vaccines- Shingrix  Discontinued     ----------------------------------------------------------------------------------------------------------------------------------------------------------------------------------------------------------------- Physical Exam BP 138/79 (BP Location: Right Arm, Patient Position: Sitting, Cuff Size: Normal)   Pulse 73   Temp 98 F (36.7 C) (Oral)   Ht 5\' 3"  (1.6  m)   Wt 144 lb (65.3 kg)   LMP 03/25/2017 (Approximate)   SpO2 100%   BMI 25.51 kg/m   Physical Exam Constitutional:      Appearance: Normal appearance.  HENT:     Head: Normocephalic and atraumatic.  Eyes:     General: No scleral icterus. Cardiovascular:     Rate and Rhythm: Normal rate and regular rhythm.  Pulmonary:     Effort: Pulmonary effort is normal.     Comments: Scattered rhonchi. Musculoskeletal:     Cervical back: Neck supple.  Neurological:     Mental Status: She is alert.  Psychiatric:        Mood and Affect: Mood normal.        Behavior: Behavior normal.     ------------------------------------------------------------------------------------------------------------------------------------------------------------------------------------------------------------------- Assessment and Plan  Cough Cough with some chest tightness.  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  Some scattered rhonchi on exam.  Chest x-ray ordered.  Will go ahead and cover empirically with azithromycin.  Tessalon as needed for cough.  Precautions and red flags reviewed.   Meds ordered this encounter  Medications   azithromycin (ZITHROMAX) 250 MG tablet    Sig: Take 2 tablets on day 1, then 1 tablet daily on days 2 through 5    Dispense:  6 tablet    Refill:  0   benzonatate (TESSALON) 200 MG capsule    Sig: Take 1 capsule (200 mg total) by mouth 2 (two) times daily as needed for cough.    Dispense:  20 capsule    Refill:  0    No follow-ups on file.    This visit occurred during the SARS-CoV-2 public health emergency.  Safety protocols were in place, including screening questions prior to the visit, additional usage of staff PPE, and extensive cleaning of exam room while observing appropriate contact time as indicated for disinfecting solutions.

## 2023-09-29 ENCOUNTER — Other Ambulatory Visit: Payer: Self-pay | Admitting: Podiatry

## 2023-10-21 ENCOUNTER — Encounter: Payer: Self-pay | Admitting: Family Medicine

## 2023-10-21 ENCOUNTER — Ambulatory Visit: Payer: 59 | Admitting: Family Medicine

## 2023-10-21 VITALS — BP 127/72 | HR 66 | Ht 63.0 in | Wt 143.0 lb

## 2023-10-21 DIAGNOSIS — F32A Depression, unspecified: Secondary | ICD-10-CM

## 2023-10-21 DIAGNOSIS — M25532 Pain in left wrist: Secondary | ICD-10-CM | POA: Diagnosis not present

## 2023-10-21 DIAGNOSIS — F410 Panic disorder [episodic paroxysmal anxiety] without agoraphobia: Secondary | ICD-10-CM | POA: Diagnosis not present

## 2023-10-21 DIAGNOSIS — I10 Essential (primary) hypertension: Secondary | ICD-10-CM | POA: Diagnosis not present

## 2023-10-21 DIAGNOSIS — F419 Anxiety disorder, unspecified: Secondary | ICD-10-CM

## 2023-10-21 DIAGNOSIS — R058 Other specified cough: Secondary | ICD-10-CM | POA: Insufficient documentation

## 2023-10-21 MED ORDER — DULOXETINE HCL 60 MG PO CPEP: 60.0000 mg | ORAL_CAPSULE | Freq: Every day | ORAL | 1 refills | Status: DC

## 2023-10-21 MED ORDER — BUSPIRONE HCL 15 MG PO TABS: 15.0000 mg | ORAL_TABLET | Freq: Three times a day (TID) | ORAL | 1 refills | Status: DC

## 2023-10-21 MED ORDER — DULOXETINE HCL 30 MG PO CPEP: 30.0000 mg | ORAL_CAPSULE | Freq: Every day | ORAL | 1 refills | Status: DC

## 2023-10-21 NOTE — Assessment & Plan Note (Signed)
Recommend continued supportive care.  Contact clinic if worsening.

## 2023-10-21 NOTE — Assessment & Plan Note (Signed)
Blood pressure stable at this time without medication.  She does exercise regularly and feel like diet is pretty good.  Continue management through diet and lifestyle change.

## 2023-10-21 NOTE — Assessment & Plan Note (Signed)
Overall fairly stable.  Will plan to continue current medications for management of mood.  F/u in 6 months or sooner if needed.

## 2023-10-21 NOTE — Assessment & Plan Note (Signed)
Continues to have pain refractory to conservative management.  I will have her schedule with Dr. Benjamin Stain.

## 2023-10-21 NOTE — Progress Notes (Signed)
Rachael [name] - 57 y.o. female MRN [id number]  Date of birth: 03-05-1966  Subjective Chief Complaint  Patient presents with   Medical Management of Chronic Issues    HPI Rachael [name] is a 57 y.o. female here today for follow up.   She reports that she continues to have some residual cough from illness last month.  Cough is non-productive.  Denies dyspnea.   Continues to have wrist pain.  Encouraged to see Dr. Benjamin Stain previously as she has tried immobilization and anti-inflammatories but has not scheduled yet.   Anxiety remains stable with current medications.  She denies side effects related to current medications.  Reports that this time of year is a little more stressful for her.   ROS:  A comprehensive ROS was completed and negative except as noted per HPI  Allergies  Allergen Reactions   Abilify [Aripiprazole]     Panic attacks when coupled with cymbalta   Shingrix [Zoster Vac Recomb Adjuvanted] Hives    Past Medical History:  Diagnosis Date   Anxiety    Depression    Hypertension     Past Surgical History:  Procedure Laterality Date   CESAREAN SECTION  01/04/1999    Social History   Socioeconomic History   Marital status: Married    Spouse name: Not on file   Number of children: Not on file   Years of education: Not on file   Highest education level: Associate degree: academic program  Occupational History   Not on file  Tobacco Use   Smoking status: Never   Smokeless tobacco: Never  Vaping Use   Vaping status: Never Used  Substance and Sexual Activity   Alcohol use: No   Drug use: No   Sexual activity: Yes    Partners: Male    Birth control/protection: None  Other Topics Concern   Not on file  Social History Narrative   Not on file   Social Determinants of Health   Financial Resource Strain: Low Risk  (09/25/2023)   Overall Financial Resource Strain (CARDIA)    Difficulty of Paying Living Expenses: Not very hard  Food Insecurity:  Unknown (09/25/2023)   Hunger Vital Sign    Worried About Running Out of Food in the Last Year: Never true    Ran Out of Food in the Last Year: Patient declined  Transportation Needs: Patient Declined (09/25/2023)   PRAPARE - Transportation    Lack of Transportation (Medical): Patient declined    Lack of Transportation (Non-Medical): Patient declined  Physical Activity: Sufficiently Active (09/25/2023)   Exercise Vital Sign    Days of Exercise per Week: 5 days    Minutes of Exercise per Session: 50 min  Stress: Stress Concern Present (09/25/2023)   [employer] of Occupational Health - Occupational Stress Questionnaire    Feeling of Stress : To some extent  Social Connections: Socially Integrated (09/25/2023)   Social Connection and Isolation Panel [NHANES]    Frequency of Communication with Friends and Family: More than three times a week    Frequency of Social Gatherings with Friends and Family: Twice a week    Attends Religious Services: More than 4 times per year    Active Member of [employer] or Organizations: Yes    Attends Engineer, structural: More than 4 times per year    Marital Status: Married    Family History  Problem Relation Age of Onset   Breast cancer Other        aunts  Diabetes Other        grandmother    Stroke Other        grandmother    Hypertension Father    Hyperlipidemia Father    Asthma Mother    COPD Mother     Health Maintenance  Topic Date Due   COVID-19 Vaccine (2 - Janssen risk series) 03/16/2020   Hepatitis C Screening  04/17/2024 (Originally 10/15/1986)   Cervical Cancer Screening (HPV/Pap Cotest)  06/14/2024   MAMMOGRAM  07/15/2025   DTaP/Tdap/Td (2 - Td or Tdap) 04/23/2027   Colonoscopy  05/04/2028   INFLUENZA VACCINE  Completed   HPV VACCINES  Aged Out   HIV Screening  Discontinued   Zoster Vaccines- Shingrix  Discontinued      ----------------------------------------------------------------------------------------------------------------------------------------------------------------------------------------------------------------- Physical Exam BP 127/72 (BP Location: Left Arm, Patient Position: Sitting, Cuff Size: Normal)   Pulse 66   Ht 5\' 3"  (1.6 m)   Wt 143 lb (64.9 kg)   LMP 03/25/2017 (Approximate)   SpO2 100%   BMI 25.33 kg/m   Physical Exam Constitutional:      Appearance: Normal appearance.  HENT:     Head: Normocephalic and atraumatic.  Eyes:     General: No scleral icterus. Cardiovascular:     Rate and Rhythm: Normal rate and regular rhythm.  Pulmonary:     Effort: Pulmonary effort is normal.     Breath sounds: Normal breath sounds.  Musculoskeletal:     Cervical back: Neck supple.  Neurological:     General: No focal deficit present.     Mental Status: She is alert.  Psychiatric:        Mood and Affect: Mood normal.        Behavior: Behavior normal.     ------------------------------------------------------------------------------------------------------------------------------------------------------------------------------------------------------------------- Assessment and Plan  Anxiety and depression Overall fairly stable.  Will plan to continue current medications for management of mood.  F/u in 6 months or sooner if needed.   Essential hypertension, benign Blood pressure stable at this time without medication.  She does exercise regularly and feel like diet is pretty good.  Continue management through diet and lifestyle change.  Left wrist pain Continues to have pain refractory to conservative management.  I will have her schedule with Dr. Benjamin Stain.   Post-viral cough syndrome Recommend continued supportive care.  Contact clinic if worsening.    Meds ordered this encounter  Medications   DULoxetine (CYMBALTA) 30 MG capsule    Sig: Take 1 capsule (30 mg  total) by mouth daily. Take with 60mg  duloxetine for total of 90mg .    Dispense:  90 capsule    Refill:  1   DULoxetine (CYMBALTA) 60 MG capsule    Sig: Take 1 capsule (60 mg total) by mouth daily.    Dispense:  90 capsule    Refill:  1   busPIRone (BUSPAR) 15 MG tablet    Sig: Take 1 tablet (15 mg total) by mouth 3 (three) times daily.    Dispense:  270 tablet    Refill:  1    Return in about 6 months (around 04/20/2024) for Mood/BH.    This visit occurred during the SARS-CoV-2 public health emergency.  Safety protocols were in place, including screening questions prior to the visit, additional usage of staff PPE, and extensive cleaning of exam room while observing appropriate contact time as indicated for disinfecting solutions.

## 2023-10-28 ENCOUNTER — Ambulatory Visit (INDEPENDENT_AMBULATORY_CARE_PROVIDER_SITE_OTHER): Payer: 59 | Admitting: Sports Medicine

## 2023-10-28 ENCOUNTER — Other Ambulatory Visit (INDEPENDENT_AMBULATORY_CARE_PROVIDER_SITE_OTHER): Payer: 59

## 2023-10-28 ENCOUNTER — Ambulatory Visit: Payer: 59

## 2023-10-28 DIAGNOSIS — M654 Radial styloid tenosynovitis [de Quervain]: Secondary | ICD-10-CM

## 2023-10-28 DIAGNOSIS — M7551 Bursitis of right shoulder: Secondary | ICD-10-CM | POA: Diagnosis not present

## 2023-10-28 MED ORDER — CELECOXIB 200 MG PO CAPS: ORAL_CAPSULE | ORAL | 2 refills | Status: DC

## 2023-10-28 NOTE — Progress Notes (Signed)
    Procedures performed today:    Procedure: Real-time Ultrasound Guided injection of the left first extensor compartment Device: Samsung HS60  Verbal informed consent obtained.  Time-out conducted.  Noted no overlying erythema, induration, or other signs of local infection.  Skin prepped in a sterile fashion.  Local anesthesia: Topical Ethyl chloride.  With sterile technique and under real time ultrasound guidance: Extensor tenosynovitis noted, 1 cc Kenalog 40, 1 cc lidocaine, 1 cc bupivacaine injected easily Completed without difficulty  Advised to call if fevers/chills, erythema, induration, drainage, or persistent bleeding.  Images permanently stored and available for review in PACS.  Impression: Technically successful ultrasound guided injection.  Independent interpretation of notes and tests performed by another provider:   None.  Brief History, Exam, Impression, and Recommendations:    De Quervain's tenosynovitis, left This is a very pleasant 57 year old female, she has had about 5 to 6 months of discomfort left wrist that she localizes at the radial aspect. She did try thumb spica bracing, meloxicam, unfortunately no improvement. She is referred to me for further evaluation and treatment. On exam she does have swelling over the first extensor compartment with a positive Finkelstein sign. We did a first extensor compartment injection, she will do some home PT and return to see me in 6 weeks.  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 Chronic pain right anterior shoulder worse with overhead activities. We discussed the anatomy and pathophysiology of the rotator cuff. She does have a benign exam today without impingement signs, labral signs or bicipital signs. I have advised her to try to keep her activities shoulder level and below. She will do some home PT, we will switch her from meloxicam to Celebrex. X-rays. Return to see me  in 6 weeks.    ____________________________________________ Ihor Austin. Benjamin Stain, M.D., ABFM., CAQSM., AME. Primary Care and Sports Medicine [facility]  Adjunct Professor of Family Medicine  [employer]  Restaurant manager, fast food

## 2023-10-28 NOTE — Assessment & Plan Note (Signed)
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 We discussed the anatomy and pathophysiology of the rotator cuff. She does have a benign exam today without impingement signs, labral signs or bicipital signs. I have advised her to try to keep her activities shoulder level and below. She will do some home PT, we will switch her from meloxicam to Celebrex. X-rays. Return to see me in 6 weeks.

## 2023-10-28 NOTE — Assessment & Plan Note (Signed)
This is a very pleasant 57 year old female, she has had about 5 to 6 months of discomfort left wrist that she localizes at the radial aspect. She did try thumb spica bracing, meloxicam, unfortunately no improvement. She is referred to me for further evaluation and treatment. On exam she does have swelling over the first extensor compartment with a positive Finkelstein sign. We did a first extensor compartment injection, she will do some home PT and return to see me in 6 weeks.

## 2023-10-29 ENCOUNTER — Encounter: Payer: Self-pay | Admitting: Family Medicine

## 2023-10-29 ENCOUNTER — Other Ambulatory Visit: Payer: Self-pay

## 2023-10-29 DIAGNOSIS — F411 Generalized anxiety disorder: Secondary | ICD-10-CM

## 2023-10-29 MED ORDER — ALPRAZOLAM 0.5 MG PO TABS: ORAL_TABLET | ORAL | 3 refills | Status: DC

## 2023-11-19 ENCOUNTER — Ambulatory Visit: Payer: 59 | Admitting: Family Medicine

## 2023-11-19 VITALS — BP 129/73 | HR 75 | Ht 63.0 in | Wt 141.0 lb

## 2023-11-19 DIAGNOSIS — K2100 Gastro-esophageal reflux disease with esophagitis, without bleeding: Secondary | ICD-10-CM | POA: Diagnosis not present

## 2023-11-19 MED ORDER — SUCRALFATE 1 G PO TABS: 1.0000 g | ORAL_TABLET | Freq: Three times a day (TID) | ORAL | 1 refills | Status: DC

## 2023-11-19 NOTE — Assessment & Plan Note (Signed)
 She has had increased reflux symptoms.  Pain does radiate to the right shoulder blade which brings up concern for possible gallbladder etiology.  Right upper quadrant ultrasound ordered.  She will continue Protonix  twice daily will add Carafate  as well.  She has had to have esophageal dilation in the past and I recommend that she follow-up with her GI doctor.  She will contact them for an appointment.

## 2023-11-19 NOTE — Patient Instructions (Signed)
 Try carafate  Continue pantoprazole twice per day.  Schedule appt with your GI doctor.

## 2023-11-19 NOTE — Progress Notes (Signed)
 This is a Rachael [name] - 58 y.o. female MRN [id number]  Date of birth: [birth year]  Subjective Chief Complaint  Patient presents with   Gastroesophageal Reflux    HPI Rachael [name] is a 58 year old female here today with complaint of increased reflux symptoms.  She does have increased reflux over the past couple weeks.  Having to take Tums in addition to Protonix  40 mg twice daily.  She has burning sensation in her chest as well as sensation of food feeling like it is moving slow down her esophagus when swallowing.  Pain does radiate into her right shoulder blade area.  She does have some mild epigastric pain.  Denies nausea or vomiting.  She has not had dark stools.  ROS:  A comprehensive ROS was completed and negative except as noted per HPI  Allergies  Allergen Reactions   Abilify [Aripiprazole]     Panic attacks when coupled with cymbalta    Shingrix  [Zoster Vac Recomb Adjuvanted] Hives    Past Medical History:  Diagnosis Date   Anxiety    Depression    Hypertension     Past Surgical History:  Procedure Laterality Date   CESAREAN SECTION  01/04/1999    Social History   Socioeconomic History   Marital status: Married    Spouse name: Not on file   Number of children: Not on file   Years of education: Not on file   Highest education level: Associate degree: academic program  Occupational History   Not on file  Tobacco Use   Smoking status: Never   Smokeless tobacco: Never  Vaping Use   Vaping status: Never Used  Substance and Sexual Activity   Alcohol use: No   Drug use: No   Sexual activity: Yes    Partners: Male    Birth control/protection: None  Other Topics Concern   Not on file  Social History Narrative   Not on file   Social Drivers of Health   Financial Resource Strain: Patient Declined (11/19/2023)   Overall Financial Resource Strain (CARDIA)    Difficulty of Paying Living Expenses: Patient declined  Food Insecurity: Patient Declined (11/19/2023)    Hunger Vital Sign    Worried About Running Out of Food in the Last Year: Patient declined    Ran Out of Food in the Last Year: Patient declined  Transportation Needs: Patient Declined (11/19/2023)   PRAPARE - Transportation    Lack of Transportation (Medical): Patient declined    Lack of Transportation (Non-Medical): Patient declined  Physical Activity: Sufficiently Active (11/19/2023)   Exercise Vital Sign    Days of Exercise per Week: 5 days    Minutes of Exercise per Session: 50 min  Stress: Stress Concern Present (11/19/2023)   [employer] of Occupational Health - Occupational Stress Questionnaire    Feeling of Stress : To some extent  Social Connections: Unknown (11/19/2023)   Social Connection and Isolation Panel [NHANES]    Frequency of Communication with Friends and Family: Patient declined    Frequency of Social Gatherings with Friends and Family: Patient declined    Attends Religious Services: More than 4 times per year    Active Member of [employer] or Organizations: Yes    Attends Engineer, Structural: More than 4 times per year    Marital Status: Married    Family History  Problem Relation Age of Onset   Breast cancer Other        aunts   Diabetes Other  grandmother    Stroke Other        grandmother    Hypertension Father    Hyperlipidemia Father    Asthma Mother    COPD Mother     Health Maintenance  Topic Date Due   COVID-19 Vaccine (2 - Janssen risk series) 03/16/2020   Hepatitis C Screening  04/17/2024 (Originally 10/15/1986)   Cervical Cancer Screening (HPV/Pap Cotest)  06/14/2024   MAMMOGRAM  07/15/2025   DTaP/Tdap/Td (2 - Td or Tdap) 04/23/2027   Colonoscopy  05/04/2028   INFLUENZA VACCINE  Completed   HPV VACCINES  Aged Out   HIV Screening  Discontinued   Zoster Vaccines- Shingrix   Discontinued      ----------------------------------------------------------------------------------------------------------------------------------------------------------------------------------------------------------------- Physical Exam BP 129/73 (BP Location: Left Arm, Patient Position: Sitting, Cuff Size: Normal)   Pulse 75   Ht 5' 3 (1.6 m)   Wt 141 lb (64 kg)   LMP 03/25/2017 (Approximate)   SpO2 100%   BMI 24.98 kg/m   Physical Exam Constitutional:      Appearance: Normal appearance.  Eyes:     General: No scleral icterus. Cardiovascular:     Rate and Rhythm: Normal rate and regular rhythm.  Musculoskeletal:     Cervical back: Neck supple.  Neurological:     Mental Status: She is alert.  Psychiatric:        Mood and Affect: Mood normal.        Behavior: Behavior normal.     ------------------------------------------------------------------------------------------------------------------------------------------------------------------------------------------------------------------- Assessment and Plan  Gastroesophageal reflux disease She has had increased reflux symptoms.  Pain does radiate to the right shoulder blade which brings up concern for possible gallbladder etiology.  Right upper quadrant ultrasound ordered.  She will continue Protonix  twice daily will add Carafate  as well.  She has had to have esophageal dilation in the past and I recommend that she follow-up with her GI doctor.  She will contact them for an appointment.   Meds ordered this encounter  Medications   sucralfate  (CARAFATE ) 1 g tablet    Sig: Take 1 tablet (1 g total) by mouth 4 (four) times daily -  with meals and at bedtime.    Dispense:  120 tablet    Refill:  1    No follow-ups on file.    This visit occurred during the SARS-CoV-2 public health emergency.  Safety protocols were in place, including screening questions prior to the visit, additional usage of staff PPE, and extensive cleaning of  exam room while observing appropriate contact time as indicated for disinfecting solutions.

## 2023-11-25 ENCOUNTER — Ambulatory Visit (INDEPENDENT_AMBULATORY_CARE_PROVIDER_SITE_OTHER): Payer: 59

## 2023-11-25 DIAGNOSIS — R1013 Epigastric pain: Secondary | ICD-10-CM | POA: Diagnosis not present

## 2023-11-25 DIAGNOSIS — K2100 Gastro-esophageal reflux disease with esophagitis, without bleeding: Secondary | ICD-10-CM

## 2023-11-26 ENCOUNTER — Ambulatory Visit: Payer: 59 | Admitting: Family Medicine

## 2023-11-27 ENCOUNTER — Encounter: Payer: Self-pay | Admitting: Podiatry

## 2023-11-27 ENCOUNTER — Ambulatory Visit: Payer: 59 | Admitting: Podiatry

## 2023-11-27 DIAGNOSIS — B351 Tinea unguium: Secondary | ICD-10-CM

## 2023-11-27 MED ORDER — CICLOPIROX 8 % EX SOLN: Freq: Every day | CUTANEOUS | 0 refills | Status: DC

## 2023-11-27 NOTE — Progress Notes (Signed)
  Subjective:  Patient ID: Rachael [name], female    DOB: January 31, 1966,   MRN: [id number]  No chief complaint on file.   58 y.o. female presents for follow-up of fungal nails relates she is maybe having some improvement.  .  Denies any other pedal complaints. Denies n/v/f/c.   Past Medical History:  Diagnosis Date   Anxiety    Depression    Hypertension     Objective:  Physical Exam: Vascular: DP/PT pulses 2/4 bilateral. CFT <3 seconds. Normal hair growth on digits. No edema.  Skin. No lacerations or abrasions bilateral feet. Right hallux nail thickened and discolored medially with subungual debris tracking to just 1/4 proximal nail fold.  Musculoskeletal: MMT 5/5 bilateral lower extremities in DF, PF, Inversion and Eversion. Deceased ROM in DF of ankle joint. Right first MPJ tender to palpation dorsally. Diminished ROM and pain on end ROM. No pain proximally or around heel.  Neurological: Sensation intact to light touch.   Assessment:   1. Onychomycosis         Plan:  Patient was evaluated and treated and all questions answered. -Doing well from arthritis standpoint for now.  -Discussed treatment options for painful dystrophic nails  -Culture results positive for fungus.  -Discussed fungal nail treatment options including oral, topical, and laser treatments.  -Will continue with penlac refill sent. Toenail debrided as courtesy.  -Patient to return in 3 months for recheck.     Louann Sjogren, DPM

## 2023-12-09 ENCOUNTER — Ambulatory Visit: Payer: 59 | Admitting: Sports Medicine

## 2023-12-09 DIAGNOSIS — M19071 Primary osteoarthritis, right ankle and foot: Secondary | ICD-10-CM

## 2023-12-09 DIAGNOSIS — M7551 Bursitis of right shoulder: Secondary | ICD-10-CM

## 2023-12-09 DIAGNOSIS — M654 Radial styloid tenosynovitis [de Quervain]: Secondary | ICD-10-CM

## 2023-12-09 NOTE — Progress Notes (Signed)
    Procedures performed today:    None.  Independent interpretation of notes and tests performed by another provider:   None.  Brief History, Exam, Impression, and Recommendations:    De Quervain's tenosynovitis, left Pleasant 58 year old female, I did a first extensor compartment injection about 6 weeks ago, she has done extremely well, completely pain-free, return as needed for this.  Subacromial bursitis of right shoulder joint Right shoulder impingement syndrome, home physical therapy given, she is doing a little better, happy with how things are going, she still has some discomfort but not enough to consider injection, continue home PT and return to see me as needed.  Osteoarthritis of first metatarsophalangeal (MTP) joint of right foot Moderate to severe first MTP osteoarthritis on the right, she did really well with custom molded orthotics back in 2018. Comanaged with podiatry We did discuss additional modalities of treatment including Morton's plate, topical and oral NSAIDs. We will try Morton's plate first and if insufficient improvement we will consider first MTP injection.  I spent 30 minutes of total time managing this patient today, this includes chart review, face to face, and non-face to face time.  ____________________________________________ Ihor Austin. Benjamin Stain, M.D., ABFM., CAQSM., AME. Primary Care and Sports Medicine [facility]  Adjunct Professor of Family Medicine  [employer]  Restaurant manager, fast food

## 2023-12-09 NOTE — Assessment & Plan Note (Signed)
Right shoulder impingement syndrome, home physical therapy given, she is doing a little better, happy with how things are going, she still has some discomfort but not enough to consider injection, continue home PT and return to see me as needed.

## 2023-12-09 NOTE — Patient Instructions (Signed)
Marland Kitchen

## 2023-12-09 NOTE — Assessment & Plan Note (Signed)
Pleasant 58 year old female, I did a first extensor compartment injection about 6 weeks ago, she has done extremely well, completely pain-free, return as needed for this.

## 2023-12-09 NOTE — Assessment & Plan Note (Signed)
Moderate to severe first MTP osteoarthritis on the right, she did really well with custom molded orthotics back in 2018. Comanaged with podiatry We did discuss additional modalities of treatment including Morton's plate, topical and oral NSAIDs. We will try Morton's plate first and if insufficient improvement we will consider first MTP injection.

## 2024-03-18 ENCOUNTER — Ambulatory Visit: Payer: 59 | Admitting: Podiatry

## 2024-04-15 ENCOUNTER — Ambulatory Visit: Admitting: Podiatry

## 2024-04-15 ENCOUNTER — Encounter: Payer: Self-pay | Admitting: Podiatry

## 2024-04-15 DIAGNOSIS — B351 Tinea unguium: Secondary | ICD-10-CM | POA: Diagnosis not present

## 2024-04-15 MED ORDER — CICLOPIROX 8 % EX SOLN: Freq: Every day | CUTANEOUS | 0 refills | Status: DC

## 2024-04-15 NOTE — Progress Notes (Signed)
  Subjective:  Patient ID: Rachael [name], female    DOB: 26-Jan-1966,   MRN: [id number]  Chief Complaint  Patient presents with   Nail Problem    "Okay, slowly but surely."    58 y.o. female presents for follow-up of fungal nails relates she is maybe having some improvement.  .  Denies any other pedal complaints. Denies n/v/f/c.   Past Medical History:  Diagnosis Date   Anxiety    Depression    Hypertension     Objective:  Physical Exam: Vascular: DP/PT pulses 2/4 bilateral. CFT <3 seconds. Normal hair growth on digits. No edema.  Skin. No lacerations or abrasions bilateral feet. Right hallux nail thickened and discolored medially with subungual debris tracking to just 1/2 proximal nail fold.  Musculoskeletal: MMT 5/5 bilateral lower extremities in DF, PF, Inversion and Eversion. Deceased ROM in DF of ankle joint. Right first MPJ tender to palpation dorsally. Diminished ROM and pain on end ROM. No pain proximally or around heel.  Neurological: Sensation intact to light touch.   Assessment:   1. Onychomycosis          Plan:  Patient was evaluated and treated and all questions answered. -Doing well from arthritis standpoint for now.  -Discussed treatment options for painful dystrophic nails  -Culture results positive for fungus.  -Discussed fungal nail treatment options including oral, topical, and laser treatments.  -Will continue with penlac  refill sent.  -Patient to return in 3 months for recheck.     Jennefer Moats, DPM

## 2024-04-18 ENCOUNTER — Other Ambulatory Visit: Payer: Self-pay | Admitting: Sports Medicine

## 2024-04-18 DIAGNOSIS — M7551 Bursitis of right shoulder: Secondary | ICD-10-CM

## 2024-04-20 ENCOUNTER — Ambulatory Visit: Payer: 59 | Admitting: Family Medicine

## 2024-04-21 ENCOUNTER — Ambulatory Visit
Admission: RE | Admit: 2024-04-21 | Discharge: 2024-04-21 | Disposition: A | Discharge status: Home / Self Care | Source: Ambulatory Visit | Attending: Family Medicine | Admitting: Family Medicine

## 2024-04-21 VITALS — BP 143/77 | HR 97 | Temp 98.7°F | Resp 18 | Ht 63.0 in | Wt 138.0 lb

## 2024-04-21 DIAGNOSIS — S61211A Laceration without foreign body of left index finger without damage to nail, initial encounter: Secondary | ICD-10-CM

## 2024-04-21 NOTE — Discharge Instructions (Signed)
 Return if needed

## 2024-04-21 NOTE — ED Triage Notes (Signed)
 Patient states that she cut her left index finger with kitchen scissors while cutting up lettuce on Monday.  Finger is still bleeding unless pressure applied.  Last Tdap 04/22/2017

## 2024-04-21 NOTE — ED Provider Notes (Signed)
 [name]    CSN: [id number] Arrival date & time: 04/21/24  1346      History   Chief Complaint Chief Complaint  Patient presents with   Laceration    Cut my finger on Monday. When pressure is taken off it is still bleeding. - Entered by patient    HPI Rachael [name] is a 58 y.o. female.   HPI  Patient has a laceration on her left index finger.  She cut her finger Monday with scissors.  When she does the bandage last night it started to bleed again.  She is concerned at the continued bleeding.  She made an appointment to be seen today.  Tetanus is up-to-date. Patient is in good health.  She is not on any blood thinners or aspirin  Past Medical History:  Diagnosis Date   Anxiety    Depression    Hypertension     Patient Active Problem List   Diagnosis Date Noted   De Quervain's tenosynovitis, left 10/28/2023   Subacromial bursitis of right shoulder joint 10/28/2023   Post-viral cough syndrome 10/21/2023   Cough 09/25/2023   Left wrist pain 06/09/2023   Right foot pain 04/20/2023   GAD (generalized anxiety disorder) 09/12/2020   Recurrent major depression resistant to treatment (HCC) 02/24/2019   Panic disorder 02/07/2019   Benign essential tremor 01/04/2019   Genetic testing 12/22/2018   Encounter for long-term current use of medication 10/15/2017   Chronic prescription benzodiazepine use 08/31/2017   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   Osteoarthritis of first metatarsophalangeal (MTP) joint of right foot 04/28/2017   Abnormal weight gain 04/22/2017   Borderline hyperlipidemia 01/20/2017   Gastroesophageal reflux disease 12/23/2016   Perimenopausal 11/20/2016   Chronic fatigue 11/20/2016   Vitamin D  insufficiency 08/03/2014   Anxiety and depression 08/02/2014   Thinning hair 08/02/2014   Essential hypertension, benign 08/02/2014    Past Surgical History:  Procedure Laterality Date   CESAREAN SECTION  01/04/1999    OB  History     Gravida  4   Para  3   Term      Preterm  1   AB  1   Living  3      SAB  1   IAB      Ectopic      Multiple      Live Births               Home Medications    Prior to Admission medications   Medication Sig Start Date End Date Taking? Authorizing Provider  ALPRAZolam  (XANAX ) 0.5 MG tablet TAKE 1/2 - 1 TABLET (0.25 - 0.5 MG TOTAL) BY MOUTH TWICE A DAY AS NEEDED FOR ANXIETY 10/29/23  Yes Adela Holter, DO  busPIRone  (BUSPAR ) 15 MG tablet Take 1 tablet (15 mg total) by mouth 3 (three) times daily. 10/21/23  Yes Adela Holter, DO  celecoxib  (CELEBREX ) 200 MG capsule TAKE 1 TO 2 TABLETS BY MOUTH DAILY AS NEEDED FOR PAIN. 04/19/24  Yes Gean Keels, MD  ciclopirox  (PENLAC ) 8 % solution Apply topically at bedtime. Apply over nail and surrounding skin. Apply daily over previous coat. After seven (7) days, may remove with alcohol and continue cycle. 04/15/24  Yes Sikora, Rebecca, DPM  DULoxetine  (CYMBALTA ) 30 MG capsule Take 1 capsule (30 mg total) by mouth daily. Take with 60mg  duloxetine  for total of 90mg . 10/21/23  Yes Adela Holter, DO  DULoxetine  (CYMBALTA ) 60 MG capsule Take 1 capsule (60 mg  total) by mouth daily. 10/21/23  Yes Adela Holter, DO  Multiple Vitamin tablet Take 1 tablet by mouth daily.   Yes [provider]  pantoprazole  (PROTONIX ) 40 MG tablet Take 1 tablet (40 mg total) by mouth daily. 04/18/23  Yes Adela Holter, DO  sucralfate  (CARAFATE ) 1 g tablet Take 1 tablet (1 g total) by mouth 4 (four) times daily -  with meals and at bedtime. 11/19/23  Yes Adela Holter, DO    Family History Family History  Problem Relation Age of Onset   Asthma Mother    COPD Mother    Hypertension Father    Hyperlipidemia Father    Breast cancer Other        aunts   Diabetes Other        grandmother    Stroke Other        grandmother     Social History Social History   Tobacco Use   Smoking status: Never   Smokeless tobacco: Never   Vaping Use   Vaping status: Never Used  Substance Use Topics   Alcohol use: No   Drug use: No     Allergies   Abilify [aripiprazole] and Shingrix  [zoster vac recomb adjuvanted]   Review of Systems Review of Systems  See HPI Physical Exam Triage Vital Signs ED Triage Vitals  Encounter Vitals Group     BP 04/21/24 1359 (!) 143/77     Systolic BP Percentile --      Diastolic BP Percentile --      Pulse Rate 04/21/24 1359 97     Resp 04/21/24 1359 18     Temp 04/21/24 1359 98.7 F (37.1 C)     Temp Source 04/21/24 1359 Oral     SpO2 04/21/24 1359 99 %     Weight 04/21/24 1400 138 lb (62.6 kg)     Height 04/21/24 1400 5' 3 (1.6 m)     Head Circumference --      Peak Flow --      Pain Score 04/21/24 1400 0     Pain Loc --      Pain Education --      Exclude from Growth Chart --    No data found.  Updated Vital Signs BP (!) 143/77 (BP Location: Right Arm)   Pulse 97   Temp 98.7 F (37.1 C) (Oral)   Resp 18   Ht 5' 3 (1.6 m)   Wt 62.6 kg   LMP 03/25/2017 (Approximate)   SpO2 99%   BMI 24.45 kg/m       Physical Exam Constitutional:      General: She is not in acute distress.    Appearance: She is well-developed and normal weight.  HENT:     Head: Normocephalic and atraumatic.  Eyes:     Conjunctiva/sclera: Conjunctivae normal.     Pupils: Pupils are equal, round, and reactive to light.  Cardiovascular:     Rate and Rhythm: Normal rate.  Pulmonary:     Effort: Pulmonary effort is normal. No respiratory distress.  Abdominal:     General: There is no distension.     Palpations: Abdomen is soft.  Musculoskeletal:        General: Normal range of motion.     Cervical back: Normal range of motion.  Skin:    General: Skin is warm and dry.     Findings: Lesion present.     Comments: On the left index finger overlying the DIP joint,  radial aspect, there is a small laceration 2 cm long and 2 mm wide that is not bleeding at this time.  I did put some  Dermabond on it to help prevent any additional complication  Neurological:     Mental Status: She is alert.      UC Treatments / Results  Labs (all labs ordered are listed, but only abnormal results are displayed) Labs Reviewed - No data to display  EKG   Radiology No results found.  Procedures Procedures (including critical care time)  Medications Ordered in UC Medications - No data to display  Initial Impression / Assessment and Plan / UC Course  I have reviewed the triage vital signs and the nursing notes.  Pertinent labs & imaging results that were available during my care of the patient were reviewed by me and considered in my medical decision making (see chart for details).     Wound care discussed Final Clinical Impressions(s) / UC Diagnoses   Final diagnoses:  Laceration of left index finger without foreign body without damage to nail, initial encounter   Discharge Instructions      Return if needed  ED Prescriptions   None    PDMP not reviewed this encounter.   Stephany Ehrich, MD 04/21/24 [phone]

## 2024-05-04 ENCOUNTER — Ambulatory Visit: Admitting: Family Medicine

## 2024-05-04 ENCOUNTER — Encounter: Payer: Self-pay | Admitting: Family Medicine

## 2024-05-04 VITALS — BP 104/67 | HR 73 | Ht 63.0 in | Wt 140.0 lb

## 2024-05-04 DIAGNOSIS — R221 Localized swelling, mass and lump, neck: Secondary | ICD-10-CM | POA: Insufficient documentation

## 2024-05-04 DIAGNOSIS — F410 Panic disorder [episodic paroxysmal anxiety] without agoraphobia: Secondary | ICD-10-CM | POA: Diagnosis not present

## 2024-05-04 DIAGNOSIS — F411 Generalized anxiety disorder: Secondary | ICD-10-CM

## 2024-05-04 DIAGNOSIS — K219 Gastro-esophageal reflux disease without esophagitis: Secondary | ICD-10-CM

## 2024-05-04 MED ORDER — PANTOPRAZOLE SODIUM 40 MG PO TBEC: 40.0000 mg | DELAYED_RELEASE_TABLET | Freq: Every day | ORAL | 3 refills | Status: AC

## 2024-05-04 MED ORDER — BUSPIRONE HCL 15 MG PO TABS: 15.0000 mg | ORAL_TABLET | Freq: Three times a day (TID) | ORAL | 1 refills | Status: DC

## 2024-05-04 MED ORDER — DULOXETINE HCL 60 MG PO CPEP: 60.0000 mg | ORAL_CAPSULE | Freq: Every day | ORAL | 1 refills | Status: DC

## 2024-05-04 MED ORDER — ALPRAZOLAM 0.5 MG PO TABS: ORAL_TABLET | ORAL | 3 refills | Status: DC

## 2024-05-04 MED ORDER — DULOXETINE HCL 30 MG PO CPEP: 30.0000 mg | ORAL_CAPSULE | Freq: Every day | ORAL | 1 refills | Status: DC

## 2024-05-04 NOTE — Assessment & Plan Note (Signed)
 Rubbery nodule on posterior neck.  Likely lymph node.  US  ordered.

## 2024-05-04 NOTE — Assessment & Plan Note (Signed)
 Stable at this time. Continue cymbalta  at current strength.  Continue BuSpar  2-3 times daily.  She does have alprazolam  that she may use as needed.

## 2024-05-04 NOTE — Progress Notes (Signed)
 Rachael [name] - 58 y.o. female MRN [id number]  Date of birth: 1966/04/10  Subjective Chief Complaint  Patient presents with   Cyst    HPI Rachael [name] is a 58 y.o. female here today for follow up visit.   She reports that mood is stable with current medications.  Still with some anxiety but able to manage pretty well.  Using buspar  BID.  Occasional use of alprazolam .  No significant side effects of related to medication.    She has noted small nodule on the back of the neck.  Denies pain associated with this.  She denies sore throat, swallowing difficulty or headaches.  Area has been present for about 6 months.   ROS:  A comprehensive ROS was completed and negative except as noted per HPI  Allergies  Allergen Reactions   Abilify [Aripiprazole]     Panic attacks when coupled with cymbalta    Shingrix  [Zoster Vac Recomb Adjuvanted] Hives    Past Medical History:  Diagnosis Date   Anxiety    Depression    Hypertension     Past Surgical History:  Procedure Laterality Date   CESAREAN SECTION  01/04/1999    Social History   Socioeconomic History   Marital status: Married    Spouse name: Not on file   Number of children: Not on file   Years of education: Not on file   Highest education level: Associate degree: academic program  Occupational History   Not on file  Tobacco Use   Smoking status: Never   Smokeless tobacco: Never  Vaping Use   Vaping status: Never Used  Substance and Sexual Activity   Alcohol use: No   Drug use: No   Sexual activity: Yes    Partners: Male    Birth control/protection: None  Other Topics Concern   Not on file  Social History Narrative   Not on file   Social Drivers of Health   Financial Resource Strain: Patient Declined (04/30/2024)   Overall Financial Resource Strain (CARDIA)    Difficulty of Paying Living Expenses: Patient declined  Food Insecurity: Patient Declined (04/30/2024)   Hunger Vital Sign    Worried About Running  Out of Food in the Last Year: Patient declined    Ran Out of Food in the Last Year: Patient declined  Transportation Needs: No Transportation Needs (04/30/2024)   PRAPARE - Administrator, Civil Service (Medical): No    Lack of Transportation (Non-Medical): No  Physical Activity: Sufficiently Active (04/30/2024)   Exercise Vital Sign    Days of Exercise per Week: 5 days    Minutes of Exercise per Session: 50 min  Stress: Stress Concern Present (04/30/2024)   [employer] of Occupational Health - Occupational Stress Questionnaire    Feeling of Stress: To some extent  Social Connections: Socially Integrated (04/30/2024)   Social Connection and Isolation Panel    Frequency of Communication with Friends and Family: Twice a week    Frequency of Social Gatherings with Friends and Family: Once a week    Attends Religious Services: More than 4 times per year    Active Member of [employer] or Organizations: Yes    Attends Engineer, structural: More than 4 times per year    Marital Status: Married    Family History  Problem Relation Age of Onset   Asthma Mother    COPD Mother    Hypertension Father    Hyperlipidemia Father    Breast cancer Other  aunts   Diabetes Other        grandmother    Stroke Other        grandmother     Health Maintenance  Topic Date Due   Hepatitis C Screening  Never done   Hepatitis B Vaccines (1 of 3 - 19+ 3-dose series) Never done   COVID-19 Vaccine (2 - Janssen risk series) 03/16/2020   Cervical Cancer Screening (HPV/Pap Cotest)  06/14/2024   INFLUENZA VACCINE  06/11/2024   MAMMOGRAM  07/15/2025   DTaP/Tdap/Td (2 - Td or Tdap) 04/23/2027   Colonoscopy  05/04/2028   HPV VACCINES  Aged Out   Meningococcal B Vaccine  Aged Out   HIV Screening  Discontinued   Zoster Vaccines- Shingrix   Discontinued      ----------------------------------------------------------------------------------------------------------------------------------------------------------------------------------------------------------------- Physical Exam BP 104/67 (BP Location: Left Arm, Patient Position: Sitting, Cuff Size: Small)   Pulse 73   Ht 5' 3 (1.6 m)   Wt 140 lb (63.5 kg)   LMP 03/25/2017 (Approximate)   SpO2 100%   BMI 24.80 kg/m   Physical Exam Constitutional:      Appearance: Normal appearance.   Eyes:     General: No scleral icterus.  Neck:     Comments: R posterior neck with non-tender rubbery nodule. Neurological:     Mental Status: She is alert.   Psychiatric:        Mood and Affect: Mood normal.        Behavior: Behavior normal.     ------------------------------------------------------------------------------------------------------------------------------------------------------------------------------------------------------------------- Assessment and Plan  Neck nodule Rubbery nodule on posterior neck.  Likely lymph node.  US  ordered.   GAD (generalized anxiety disorder) Stable at this time. Continue cymbalta  at current strength.  Continue BuSpar  2-3 times daily.  She does have alprazolam  that she may use as needed.   Meds ordered this encounter  Medications   DULoxetine  (CYMBALTA ) 60 MG capsule    Sig: Take 1 capsule (60 mg total) by mouth daily.    Dispense:  90 capsule    Refill:  1   DULoxetine  (CYMBALTA ) 30 MG capsule    Sig: Take 1 capsule (30 mg total) by mouth daily. Take with 60mg  duloxetine  for total of 90mg .    Dispense:  90 capsule    Refill:  1   busPIRone  (BUSPAR ) 15 MG tablet    Sig: Take 1 tablet (15 mg total) by mouth 3 (three) times daily.    Dispense:  270 tablet    Refill:  1   ALPRAZolam  (XANAX ) 0.5 MG tablet    Sig: TAKE 1/2 - 1 TABLET (0.25 - 0.5 MG TOTAL) BY MOUTH TWICE A DAY AS NEEDED FOR ANXIETY    Dispense:  20 tablet    Refill:  3     This request is for a new prescription for a controlled substance as required by Federal/State law.   pantoprazole  (PROTONIX ) 40 MG tablet    Sig: Take 1 tablet (40 mg total) by mouth daily.    Dispense:  90 tablet    Refill:  3    Return in about 6 months (around 11/03/2024) for Mood/BH.

## 2024-05-06 ENCOUNTER — Ambulatory Visit

## 2024-05-06 DIAGNOSIS — R221 Localized swelling, mass and lump, neck: Secondary | ICD-10-CM | POA: Diagnosis not present

## 2024-05-19 ENCOUNTER — Ambulatory Visit: Payer: Self-pay | Admitting: Family Medicine

## 2024-06-07 ENCOUNTER — Ambulatory Visit
Admission: RE | Admit: 2024-06-07 | Discharge: 2024-06-07 | Disposition: A | Discharge status: Home / Self Care | Source: Ambulatory Visit | Attending: Family Medicine | Admitting: Family Medicine

## 2024-06-07 VITALS — BP 116/77 | HR 91 | Temp 98.9°F | Resp 17

## 2024-06-07 DIAGNOSIS — Z20822 Contact with and (suspected) exposure to covid-19: Secondary | ICD-10-CM

## 2024-06-07 DIAGNOSIS — H6500 Acute serous otitis media, unspecified ear: Secondary | ICD-10-CM

## 2024-06-07 DIAGNOSIS — J029 Acute pharyngitis, unspecified: Secondary | ICD-10-CM

## 2024-06-07 LAB — POC SARS CORONAVIRUS 2 AG -  ED: SARS Coronavirus 2 Ag: NEGATIVE

## 2024-06-07 MED ORDER — FLUTICASONE PROPIONATE 50 MCG/ACT NA SUSP: 2.0000 | Freq: Every day | NASAL | 0 refills | Status: AC

## 2024-06-07 MED ORDER — AMOXICILLIN 875 MG PO TABS: 875.0000 mg | ORAL_TABLET | Freq: Two times a day (BID) | ORAL | 0 refills | Status: DC

## 2024-06-07 NOTE — Discharge Instructions (Signed)
 Drink lots of fluids Take the amoxicillin  2 x day Use flonase  daily to relieve ear pressure and post nasal drip See your doctor if not better by end of week

## 2024-06-07 NOTE — ED Provider Notes (Signed)
 [name]    CSN: [id number] Arrival date & time: 06/07/24  9182      History   Chief Complaint Chief Complaint  Patient presents with   Sore Throat   Nausea   Otalgia    LT    HPI Rachael [name] is a 58 y.o. female.   Patient has an upper respiratory infection.  4 days of cough cold runny nose.  Having some dizziness.  Some ear pressure and pain on the left.  No known exposure to illness.  No recent travel.  Non-smoker.  Usually in good health    Past Medical History:  Diagnosis Date   Anxiety    Depression    Hypertension     Patient Active Problem List   Diagnosis Date Noted   Neck nodule 05/04/2024   De Quervain's tenosynovitis, left 10/28/2023   Subacromial bursitis of right shoulder joint 10/28/2023   Cough 09/25/2023   Left wrist pain 06/09/2023   Right foot pain 04/20/2023   GAD (generalized anxiety disorder) 09/12/2020   Recurrent major depression resistant to treatment (HCC) 02/24/2019   Panic disorder 02/07/2019   Benign essential tremor 01/04/2019   Genetic testing 12/22/2018   Encounter for long-term current use of medication 10/15/2017   Chronic prescription benzodiazepine use 08/31/2017   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   Osteoarthritis of first metatarsophalangeal (MTP) joint of right foot 04/28/2017   Abnormal weight gain 04/22/2017   Borderline hyperlipidemia 01/20/2017   Gastroesophageal reflux disease 12/23/2016   Perimenopausal 11/20/2016   Chronic fatigue 11/20/2016   Vitamin D  insufficiency 08/03/2014   Anxiety and depression 08/02/2014   Thinning hair 08/02/2014   Essential hypertension, benign 08/02/2014    Past Surgical History:  Procedure Laterality Date   CESAREAN SECTION  01/04/1999    OB History     Gravida  4   Para  3   Term      Preterm  1   AB  1   Living  3      SAB  1   IAB      Ectopic      Multiple      Live Births               Home Medications     Prior to Admission medications   Medication Sig Start Date End Date Taking? Authorizing Provider  amoxicillin  (AMOXIL ) 875 MG tablet Take 1 tablet (875 mg total) by mouth 2 (two) times daily. 06/07/24  Yes Maranda Jamee Jacob, MD  fluticasone  (FLONASE ) 50 MCG/ACT nasal spray Place 2 sprays into both nostrils daily. 06/07/24  Yes Maranda Jamee Jacob, MD  ALPRAZolam  (XANAX ) 0.5 MG tablet TAKE 1/2 - 1 TABLET (0.25 - 0.5 MG TOTAL) BY MOUTH TWICE A DAY AS NEEDED FOR ANXIETY 05/04/24   Alvia Bring, DO  busPIRone  (BUSPAR ) 15 MG tablet Take 1 tablet (15 mg total) by mouth 3 (three) times daily. 05/04/24   Alvia Bring, DO  celecoxib  (CELEBREX ) 200 MG capsule TAKE 1 TO 2 TABLETS BY MOUTH DAILY AS NEEDED FOR PAIN. 04/19/24   Curtis Debby PARAS, MD  ciclopirox  (PENLAC ) 8 % solution Apply topically at bedtime. Apply over nail and surrounding skin. Apply daily over previous coat. After seven (7) days, may remove with alcohol and continue cycle. 04/15/24   Sikora, Rebecca, DPM  DULoxetine  (CYMBALTA ) 30 MG capsule Take 1 capsule (30 mg total) by mouth daily. Take with 60mg  duloxetine  for total of 90mg . 05/04/24   Alvia,  Cody, DO  DULoxetine  (CYMBALTA ) 60 MG capsule Take 1 capsule (60 mg total) by mouth daily. 05/04/24   Alvia Bring, DO  Multiple Vitamin tablet Take 1 tablet by mouth daily.    [provider]  pantoprazole  (PROTONIX ) 40 MG tablet Take 1 tablet (40 mg total) by mouth daily. 05/04/24   Alvia Bring, DO  sucralfate  (CARAFATE ) 1 g tablet Take 1 tablet (1 g total) by mouth 4 (four) times daily -  with meals and at bedtime. 11/19/23   Alvia Bring, DO    Family History Family History  Problem Relation Age of Onset   Asthma Mother    COPD Mother    Hypertension Father    Hyperlipidemia Father    Breast cancer Other        aunts   Diabetes Other        grandmother    Stroke Other        grandmother     Social History Social History   Tobacco Use   Smoking status: Never    Smokeless tobacco: Never  Vaping Use   Vaping status: Never Used  Substance Use Topics   Alcohol use: No   Drug use: No     Allergies   Abilify [aripiprazole] and Shingrix  [zoster vac recomb adjuvanted]   Review of Systems Review of Systems See HPI  Physical Exam Triage Vital Signs ED Triage Vitals  Encounter Vitals Group     BP 06/07/24 0840 116/77     Girls Systolic BP Percentile --      Girls Diastolic BP Percentile --      Boys Systolic BP Percentile --      Boys Diastolic BP Percentile --      Pulse Rate 06/07/24 0840 91     Resp 06/07/24 0840 17     Temp 06/07/24 0840 98.9 F (37.2 C)     Temp src --      SpO2 06/07/24 0840 100 %     Weight --      Height --      Head Circumference --      Peak Flow --      Pain Score 06/07/24 0841 3     Pain Loc --      Pain Education --      Exclude from Growth Chart --    No data found.  Updated Vital Signs BP 116/77 (BP Location: Right Arm)   Pulse 91   Temp 98.9 F (37.2 C)   Resp 17   LMP 03/25/2017 (Approximate)   SpO2 100%      Physical Exam Constitutional:      General: She is not in acute distress.    Appearance: She is well-developed.  HENT:     Head: Normocephalic and atraumatic.     Right Ear: Tympanic membrane and ear canal normal.     Left Ear: Ear canal normal. Tympanic membrane is erythematous.     Mouth/Throat:     Mouth: Mucous membranes are moist.     Pharynx: Uvula midline. No pharyngeal swelling.     Tonsils: No tonsillar exudate. 1+ on the right. 1+ on the left.  Eyes:     Conjunctiva/sclera: Conjunctivae normal.     Pupils: Pupils are equal, round, and reactive to light.  Cardiovascular:     Rate and Rhythm: Normal rate and regular rhythm.  Pulmonary:     Effort: Pulmonary effort is normal. No respiratory distress.     Breath  sounds: Normal breath sounds.  Abdominal:     General: There is no distension.     Palpations: Abdomen is soft.  Musculoskeletal:        General: Normal  range of motion.     Cervical back: Normal range of motion.  Lymphadenopathy:     Cervical: Cervical adenopathy present.  Skin:    General: Skin is warm and dry.  Neurological:     Mental Status: She is alert.      UC Treatments / Results  Labs (all labs ordered are listed, but only abnormal results are displayed) Labs Reviewed  POC SARS CORONAVIRUS 2 AG -  ED    EKG   Radiology No results found.  Procedures Procedures (including critical care time)  Medications Ordered in UC Medications - No data to display  Initial Impression / Assessment and Plan / UC Course  I have reviewed the triage vital signs and the nursing notes.  Pertinent labs & imaging results that were available during my care of the patient were reviewed by me and considered in my medical decision making (see chart for details).     COVID testing is negative Patient likely has a viral upper respiratory infection but with ear pain, dizziness, and redness of her eardrum I feel covering with antibiotics is reasonable Final Clinical Impressions(s) / UC Diagnoses   Final diagnoses:  Non-recurrent acute serous otitis media, unspecified laterality  Pharyngitis, unspecified etiology  Close exposure to COVID-19 virus     Discharge Instructions      Drink lots of fluids Take the amoxicillin  2 x day Use flonase  daily to relieve ear pressure and post nasal drip See your doctor if not better by end of week     ED Prescriptions     Medication Sig Dispense Auth. Provider   amoxicillin  (AMOXIL ) 875 MG tablet Take 1 tablet (875 mg total) by mouth 2 (two) times daily. 14 tablet Maranda Jamee Jacob, MD   fluticasone  (FLONASE ) 50 MCG/ACT nasal spray Place 2 sprays into both nostrils daily. 16 g Maranda Jamee Jacob, MD      PDMP not reviewed this encounter.   Maranda Jamee Jacob, MD 06/07/24 1134

## 2024-06-07 NOTE — ED Triage Notes (Signed)
 Pt c/o sore throat since Fri. Some nausea and mild LT ear pain. Denies fever. Tylenol and allergy med prn.

## 2024-06-10 ENCOUNTER — Ambulatory Visit: Admitting: Obstetrics and Gynecology

## 2024-06-10 ENCOUNTER — Encounter: Payer: Self-pay | Admitting: Family Medicine

## 2024-06-10 ENCOUNTER — Encounter: Payer: Self-pay | Admitting: Obstetrics and Gynecology

## 2024-06-10 VITALS — BP 114/72 | HR 76 | Ht 63.0 in | Wt 139.0 lb

## 2024-06-10 DIAGNOSIS — R6882 Decreased libido: Secondary | ICD-10-CM | POA: Diagnosis not present

## 2024-06-10 DIAGNOSIS — N898 Other specified noninflammatory disorders of vagina: Secondary | ICD-10-CM

## 2024-06-10 MED ORDER — ESTRADIOL 0.1 MG/GM VA CREA: 2.0000 g | TOPICAL_CREAM | Freq: Every day | VAGINAL | 6 refills | Status: DC

## 2024-06-10 NOTE — Progress Notes (Signed)
 GYNECOLOGY OFFICE VISIT NOTE  History:  Rachael [name] is a 58 y.o. [phone] here today for low sex drive and vaginal dryness.   Discussed the use of AI scribe software for clinical note transcription with the patient, who gave verbal consent to proceed.  History of Present Illness Rachael [name] is a 58 year old female who presents with menopausal symptoms, including vaginal dryness and low sex drive.  She experiences significant vaginal dryness and a complete lack of sex drive, which she finds frustrating. Her symptoms began around the time she went through menopause, which was gradual, with her periods stopping completely by age 24. She describes her sex drive as non-existent, stating that she does not want to initiate sexual activity and does not find it enjoyable even when she does engage. The lack of enjoyment is not attributed to discomfort from vaginal dryness or pain. The drive is low enough that she has not engaged in intercourse for a long time.  She experiences mild hot flashes almost daily, though they are not severe. Initially, they were more frequent at the beginning of menopause, but she feels she may have become accustomed to them over time. She also experiences fatigue, often feeling the need to nap during the day, which she attributes to her morning exercise routine. She does not have trouble sleeping at night, although she occasionally wakes up to urinate.  She is currently taking Buspar  and Cymbalta  for anxiety. Her depression has improved since menopause, but her anxiety has worsened. She exercises regularly, teaching exercise classes, which she believes helps with her endorphin levels. She drinks a lot of water and urinates frequently during the day, but does not experience urgency or incontinence, except for slight leaking when coughing.  She has a supportive husband and mentions that her lack of sex drive is not due to any relationship issues.    Past Medical  History:  Diagnosis Date   Anxiety    Depression    Hypertension     Past Surgical History:  Procedure Laterality Date   CESAREAN SECTION  01/04/1999    The following portions of the patient's history were reviewed and updated as appropriate: allergies, current medications, past family history, past medical history, past social history, past surgical history and problem list.   Health Maintenance:   Normal pap and negative HRHPV on 2020 in [facility].  Diagnosis  Date Value Ref Range Status  09/04/2016   Final   NEGATIVE FOR INTRAEPITHELIAL LESIONS OR MALIGNANCY.     Normal mammogram on 07/2023 - birad 1/not dense.   Review of Systems:  Pertinent items noted in HPI and remainder of comprehensive ROS otherwise negative.  Physical Exam:  BP 114/72   Pulse 76   Ht 5' 3 (1.6 m)   Wt 139 lb (63 kg)   LMP 03/25/2017 (Approximate)   BMI 24.62 kg/m  CONSTITUTIONAL: Well-developed, well-nourished female in no acute distress.  HEENT:  Normocephalic, atraumatic. External right and left ear normal. No scleral icterus.  NECK: Normal range of motion, supple, no masses noted on observation SKIN: No rash noted. Not diaphoretic. No erythema. No pallor. MUSCULOSKELETAL: Normal range of motion. No edema noted. NEUROLOGIC: Alert and oriented to person, place, and time. Normal muscle tone coordination. No cranial nerve deficit noted. PSYCHIATRIC: Normal mood and affect. Normal behavior. Normal judgment and thought content.  PELVIC: Deferred  Labs and Imaging Results for orders placed or performed during the hospital encounter of 06/07/24 (from the past week)  POC SARS  Coronavirus 2 Ag-ED - Nasal Swab   Collection Time: 06/07/24  9:10 AM  Result Value Ref Range   SARS Coronavirus 2 Ag Negative Negative   No results found.  Assessment and Plan:  Rachael [name] was seen today for menopause symptoms and gynecologic exam.  Diagnoses and all orders for this visit:  Low libido Vaginal dryness  treatable with vaginal estrogen. Low libido influenced by hormonal changes, antidepressant use, and emotional factors. Cymbalta  may contribute to low libido. Hormone replacement therapy helps hot flashes, not libido. Wellbutrin may improve libido and anxiety. Topical testosterone is an option if testosterone levels are low. - Prescribe vaginal estrogen, use daily for two weeks, then 1-3 times weekly as maintenance. This may help arousal component of low libido by helping to restore anatomy and sensation.  - Discuss with PCP about switching from Cymbalta  to Wellbutrin. - Consider topical testosterone if switching antidepressants is not suitable. Would check testosterone level and CMP prior to initiating to see if it would be beneficial. Reviewed if testosterone level is normal, then would not advise testosterone.   Vaginal dryness Vaginal dryness treatable with vaginal estrogen. Discussed Replens moisturizer for non-hormonal option.  Discussed Silicone based lubricant or coconut oil.  -     estradiol  (ESTRACE  VAGINAL) 0.1 MG/GM vaginal cream; Place 2 g vaginally daily. Daily for 2 weeks then twice weekly thereafter   Meds ordered this encounter  Medications   estradiol  (ESTRACE  VAGINAL) 0.1 MG/GM vaginal cream    Sig: Place 2 g vaginally daily. Daily for 2 weeks then twice weekly thereafter    Dispense:  45 g    Refill:  6     Routine preventative health maintenance measures emphasized. Please refer to After Visit Summary for other counseling recommendations.   Return in about 1 month (around 07/11/2024) for annual, Follow up.  Rachael Solian, MD, FACOG Obstetrician & Gynecologist, [facility], [place] [facility]

## 2024-06-14 ENCOUNTER — Other Ambulatory Visit: Payer: Self-pay | Admitting: Podiatry

## 2024-06-17 ENCOUNTER — Encounter: Payer: Self-pay | Admitting: Obstetrics and Gynecology

## 2024-06-17 ENCOUNTER — Other Ambulatory Visit: Payer: Self-pay | Admitting: *Deleted

## 2024-06-17 DIAGNOSIS — N898 Other specified noninflammatory disorders of vagina: Secondary | ICD-10-CM

## 2024-06-17 MED ORDER — ESTRADIOL 0.1 MG/GM VA CREA: 2.0000 g | TOPICAL_CREAM | Freq: Every day | VAGINAL | 6 refills | Status: DC

## 2024-06-17 NOTE — Progress Notes (Signed)
 Estrace  vaginal cream was sent as previously noted in chart as pharmacy did not get original order.

## 2024-07-08 ENCOUNTER — Ambulatory Visit: Admitting: Family Medicine

## 2024-07-08 ENCOUNTER — Encounter: Payer: Self-pay | Admitting: Family Medicine

## 2024-07-08 VITALS — BP 135/78 | HR 87 | Ht 63.0 in | Wt 140.0 lb

## 2024-07-08 DIAGNOSIS — F32A Depression, unspecified: Secondary | ICD-10-CM | POA: Diagnosis not present

## 2024-07-08 DIAGNOSIS — F419 Anxiety disorder, unspecified: Secondary | ICD-10-CM

## 2024-07-08 MED ORDER — DULOXETINE HCL 20 MG PO CPEP
ORAL_CAPSULE | ORAL | 0 refills | Status: DC
Start: 2024-07-08 — End: 2024-08-05

## 2024-07-08 MED ORDER — DULOXETINE HCL 30 MG PO CPEP
ORAL_CAPSULE | ORAL | 0 refills | Status: DC
Start: 2024-07-08 — End: 2024-08-05

## 2024-07-08 MED ORDER — BUPROPION HCL ER (XL) 150 MG PO TB24: 150.0000 mg | ORAL_TABLET | ORAL | 0 refills | Status: DC

## 2024-07-08 NOTE — Progress Notes (Signed)
 Rachael [name] - 58 y.o. female MRN [id number]  Date of birth: 1966-10-26  Subjective Chief Complaint  Patient presents with   Medication Problem    HPI Rachael [name] is a 58 y.o. female here today for follow up visit.   She reports that she is doing well.   She recently had visit with her GYN who recommended that she consider replacing duloxetine  with bupropion  to help with menopausal symptoms.  She has experienced decreased libido and vaginal dryness which her GYN thinks may be worsened by SNRI use.  She has had mild hot flashes.    ROS:  A comprehensive ROS was completed and negative except as noted per HPI  Allergies  Allergen Reactions   Abilify [Aripiprazole]     Panic attacks when coupled with cymbalta    Shingrix  [Zoster Vac Recomb Adjuvanted] Hives    Past Medical History:  Diagnosis Date   Anxiety    Depression    Hypertension     Past Surgical History:  Procedure Laterality Date   CESAREAN SECTION  01/04/1999    Social History   Socioeconomic History   Marital status: Married    Spouse name: Not on file   Number of children: Not on file   Years of education: Not on file   Highest education level: Associate degree: academic program  Occupational History   Not on file  Tobacco Use   Smoking status: Never   Smokeless tobacco: Never  Vaping Use   Vaping status: Never Used  Substance and Sexual Activity   Alcohol use: No   Drug use: No   Sexual activity: Yes    Partners: Male    Birth control/protection: None  Other Topics Concern   Not on file  Social History Narrative   Not on file   Social Drivers of [name]   Financial Resource Strain: Patient Declined (04/30/2024)   Overall Financial Resource Strain (CARDIA)    Difficulty of Paying Living Expenses: Patient declined  Food Insecurity: No Food Insecurity (05/04/2024)   Hunger Vital Sign    Worried About Running Out of Food in the Last Year: Never true    Ran Out of Food in the Last Year:  Never true  Transportation Needs: No Transportation Needs (04/30/2024)   PRAPARE - Administrator, Civil Service (Medical): No    Lack of Transportation (Non-Medical): No  Physical Activity: Sufficiently Active (04/30/2024)   Exercise Vital Sign    Days of Exercise per Week: 5 days    Minutes of Exercise per Session: 50 min  Stress: Stress Concern Present (04/30/2024)   Rachael [name] - Occupational Stress Questionnaire    Feeling of Stress: To some extent  Social Connections: Socially Integrated (04/30/2024)   Social Connection and Isolation Panel    Frequency of Communication with Friends and Family: Twice a week    Frequency of Social Gatherings with Friends and Family: Once a week    Attends Religious Services: More than 4 times per year    Active Member of [employer] or Organizations: Yes    Attends Engineer, structural: More than 4 times per year    Marital Status: Married    Family History  Problem Relation Age of Onset   Asthma Mother    COPD Mother    Hypertension Father    Hyperlipidemia Father    Breast cancer Other        aunts   Diabetes Other  grandmother    Stroke Other        grandmother     [name] Maintenance  Topic Date Due   Hepatitis C Screening  Never done   Hepatitis B Vaccines 19-59 Average Risk (1 of 3 - 19+ 3-dose series) Never done   Pneumococcal Vaccine: 50+ Years (1 of 1 - PCV) Never done   Cervical Cancer Screening (HPV/Pap Cotest)  06/14/2024   INFLUENZA VACCINE  06/11/2024   COVID-19 Vaccine (2 - Janssen risk series) 11/03/2024 (Originally 03/16/2020)   MAMMOGRAM  07/15/2025   DTaP/Tdap/Td (2 - Td or Tdap) 04/23/2027   Colonoscopy  05/04/2028   HPV VACCINES  Aged Out   Meningococcal B Vaccine  Aged Out   HIV Screening  Discontinued   Zoster Vaccines- Shingrix   Discontinued      ----------------------------------------------------------------------------------------------------------------------------------------------------------------------------------------------------------------- Physical Exam BP 135/78 (BP Location: Left Arm, Patient Position: Sitting, Cuff Size: Small)   Pulse 87   Ht 5' 3 (1.6 m)   Wt 140 lb (63.5 kg)   LMP 03/25/2017 (Approximate)   SpO2 100%   BMI 24.80 kg/m   Physical Exam Constitutional:      Appearance: Normal appearance.  Neurological:     Mental Status: She is alert.  Psychiatric:        Mood and Affect: Mood normal.        Behavior: Behavior normal.     ------------------------------------------------------------------------------------------------------------------------------------------------------------------------------------------------------------------- Assessment and Plan  Anxiety and depression She has done pretty well with cymbalta  but may be contributing to sexual side effects  Will taper duloxetine .  Start bupropion  once she reaches 20mg  strength.  Follow up in 4 weeks.    Meds ordered this encounter  Medications   DULoxetine  (CYMBALTA ) 30 MG capsule    Sig: Take 1 cap po daily x7 days then reduce to 20mg .    Dispense:  7 capsule    Refill:  0   DULoxetine  (CYMBALTA ) 20 MG capsule    Sig: Take 1 cap po daily x 7 days then every other day x10 days.    Dispense:  15 capsule    Refill:  0   buPROPion  (WELLBUTRIN  XL) 150 MG 24 hr tablet    Sig: Take 1 tablet (150 mg total) by mouth every morning.    Dispense:  90 tablet    Refill:  0    Return in about 4 weeks (around 08/05/2024) for Mood/BH.

## 2024-07-08 NOTE — Assessment & Plan Note (Signed)
 She has done pretty well with cymbalta  but may be contributing to sexual side effects  Will taper duloxetine .  Start bupropion  once she reaches 20mg  strength.  Follow up in 4 weeks.

## 2024-07-08 NOTE — Patient Instructions (Signed)
 Take 30mg  daily x7 days, then reduce to 20mg  daily x7 days, then every other day x10 days.

## 2024-07-13 ENCOUNTER — Encounter: Payer: Self-pay | Admitting: Sports Medicine

## 2024-07-18 NOTE — Progress Notes (Unsigned)
   ANNUAL EXAM Patient name: Rachael [name] MRN [id number]  Date of birth: [birth year] Chief Complaint:   No chief complaint on file.  History of Present Illness:   Rachael [name] is a 58 y.o. [phone] female being seen today for a routine annual exam.   Current concerns: vaginal cream is messy. Seems to help but has only been able to use 1-2 times a week,   Patient's last menstrual period was 03/25/2017 (approximate).   Last MXR: 07/2023  Last Pap/Pap History:  2017: Normal pap 2020: Pap/hpv wnl.   H/O abnormal pap: no  Review of Systems:   Pertinent items are noted in HPI Denies any headaches, blurred vision, fatigue, shortness of breath, chest pain, abdominal pain, abnormal vaginal discharge/itching/odor/irritation, problems with periods, bowel movements, urination, or intercourse unless otherwise stated above.  Pertinent History Reviewed:  Reviewed past medical,surgical, social and family history.  Reviewed problem list, medications and allergies. Physical Assessment:  There were no vitals filed for this visit.There is no height or weight on file to calculate BMI.   Physical Examination:  General appearance - well appearing, and in no distress Mental status - alert, oriented to person, place, and time Psych:  She has a normal mood and affect Skin - warm and dry, normal color, no suspicious lesions noted Chest - effort normal Heart - normal rate  Breasts - breasts appear normal, no suspicious masses, no skin or nipple changes or axillary nodes Abdomen - soft, nontender, nondistended, no masses or organomegaly Pelvic -  Performed and: VULVA: normal appearing vulva with no masses, tenderness or lesions VAGINA: normal appearing vagina with improved appearance in terms of color with normal color and discharge, no lesions CERVIX: normal appearing cervix without discharge or lesions, no CMT. UTERUS: Not examined ADNEXA: Not examined Extremities:  No swelling or varicosities  noted  Chaperone present for exam  No results found for this or any previous visit (from the past 24 hours).  Assessment & Plan:  Diagnoses and all orders for this visit:  Encounter for annual routine gynecological examination  - Cervical cancer screening: Discussed guidelines. Pap with HPV done - Breast Health: Encouraged self breast awareness/SBE. Discussed limits of clinical breast exam for detecting breast cancer. Discussed importance of annual MXR. Rx given for MXR - Climacteric/Sexual health: Reviewed typical and atypical symptoms of menopause/peri-menopause. Discussed PMB and to call if any amount of spotting.  - Colonoscopy: Per PCP (due 04/2028) - F/U 12 months and prn     No orders of the defined types were placed in this encounter.   Meds: No orders of the defined types were placed in this encounter.   Follow-up: No follow-ups on file.  Vina Solian, MD 07/18/2024 2:39 AM

## 2024-07-21 ENCOUNTER — Other Ambulatory Visit (HOSPITAL_COMMUNITY)
Admission: RE | Admit: 2024-07-21 | Discharge: 2024-07-21 | Disposition: A | Discharge status: Home / Self Care | Source: Ambulatory Visit | Attending: Obstetrics and Gynecology | Admitting: Obstetrics and Gynecology

## 2024-07-21 ENCOUNTER — Ambulatory Visit: Admitting: Obstetrics and Gynecology

## 2024-07-21 VITALS — BP 118/74 | HR 72 | Ht 63.0 in | Wt 140.0 lb

## 2024-07-21 DIAGNOSIS — N898 Other specified noninflammatory disorders of vagina: Secondary | ICD-10-CM

## 2024-07-21 DIAGNOSIS — Z01419 Encounter for gynecological examination (general) (routine) without abnormal findings: Secondary | ICD-10-CM | POA: Diagnosis present

## 2024-07-21 MED ORDER — IMVEXXY MAINTENANCE PACK 10 MCG VA INST: 1.0000 | VAGINAL_INSERT | VAGINAL | 3 refills | Status: AC

## 2024-07-22 ENCOUNTER — Ambulatory Visit: Payer: Self-pay | Admitting: Obstetrics and Gynecology

## 2024-07-22 LAB — CYTOLOGY - PAP
Comment: NEGATIVE
Diagnosis: NEGATIVE
High risk HPV: NEGATIVE

## 2024-08-02 ENCOUNTER — Encounter: Payer: Self-pay | Admitting: Family Medicine

## 2024-08-05 ENCOUNTER — Ambulatory Visit: Admitting: Family Medicine

## 2024-08-05 ENCOUNTER — Encounter: Payer: Self-pay | Admitting: Family Medicine

## 2024-08-05 VITALS — BP 123/71 | HR 67 | Ht 63.0 in | Wt 139.0 lb

## 2024-08-05 DIAGNOSIS — F32A Depression, unspecified: Secondary | ICD-10-CM

## 2024-08-05 DIAGNOSIS — Z23 Encounter for immunization: Secondary | ICD-10-CM

## 2024-08-05 DIAGNOSIS — F419 Anxiety disorder, unspecified: Secondary | ICD-10-CM

## 2024-08-05 MED ORDER — FLUOXETINE HCL 10 MG PO CAPS
ORAL_CAPSULE | ORAL | 3 refills | Status: DC
Start: 2024-08-05 — End: 2024-09-30

## 2024-08-05 MED ORDER — DULOXETINE HCL 20 MG PO CPEP: ORAL_CAPSULE | ORAL | 0 refills | Status: DC

## 2024-08-05 NOTE — Patient Instructions (Signed)
 Take duloxetine  20mg  twice daily for 3 days then reduce to 20mg  daily x7 days.  Go ahead and start fluoxetine  at this point.  After 7 days take duloxetine  20mg  every other day for 7 days then stop.

## 2024-08-05 NOTE — Assessment & Plan Note (Signed)
 She has tried multiple medications previously.  GeneSight testing reviewed.  Has not tried fluoxetine , which is in the moderate column on her GeneSight testing.  Will start fluoxetine  and cross-taper with her duloxetine .  Will plan to follow-up in 4 to 6 weeks.

## 2024-08-05 NOTE — Progress Notes (Signed)
 Rachael [name] - 58 y.o. female MRN [id number]  Date of birth: July 16, 1966  Subjective Chief Complaint  Patient presents with   Immunizations   Mood    HPI Rachael [name] is a 58 y.o. female here today for follow up visit.   She had been on duloxetine  for several years for anxiety.  Her GYN suggested change to bupropion  to help with management of menopausal symptoms.  She did start bupropion  and reports that she has severe mood change and dark thoughts.  Since she has started back on duloxetine .  Did really care for duloxetine  for a couple reasons.  This included decreased libido as well as flattened affect.  She has tried several other medications in the past.  She has stopped her use due to either not be helpful or intolerable side effects, mostly nausea.  She did have GeneSight testing previously as well.  ROS:  A comprehensive ROS was completed and negative except as noted per HPI  Allergies  Allergen Reactions   Abilify [Aripiprazole]     Panic attacks when coupled with cymbalta    Shingrix  [Zoster Vac Recomb Adjuvanted] Hives    Past Medical History:  Diagnosis Date   Anxiety    Depression    Hypertension     Past Surgical History:  Procedure Laterality Date   CESAREAN SECTION  01/04/1999    Social History   Socioeconomic History   Marital status: Married    Spouse name: Not on file   Number of children: Not on file   Years of education: Not on file   Highest education level: Associate degree: academic program  Occupational History   Not on file  Tobacco Use   Smoking status: Never   Smokeless tobacco: Never  Vaping Use   Vaping status: Never Used  Substance and Sexual Activity   Alcohol use: No   Drug use: No   Sexual activity: Yes    Partners: Male    Birth control/protection: None  Other Topics Concern   Not on file  Social History Narrative   Not on file   Social Drivers of Health   Financial Resource Strain: Patient Declined (04/30/2024)    Overall Financial Resource Strain (CARDIA)    Difficulty of Paying Living Expenses: Patient declined  Food Insecurity: No Food Insecurity (05/04/2024)   Hunger Vital Sign    Worried About Running Out of Food in the Last Year: Never true    Ran Out of Food in the Last Year: Never true  Transportation Needs: No Transportation Needs (04/30/2024)   PRAPARE - Administrator, Civil Service (Medical): No    Lack of Transportation (Non-Medical): No  Physical Activity: Sufficiently Active (04/30/2024)   Exercise Vital Sign    Days of Exercise per Week: 5 days    Minutes of Exercise per Session: 50 min  Stress: Stress Concern Present (04/30/2024)   [employer] of Occupational Health - Occupational Stress Questionnaire    Feeling of Stress: To some extent  Social Connections: Socially Integrated (04/30/2024)   Social Connection and Isolation Panel    Frequency of Communication with Friends and Family: Twice a week    Frequency of Social Gatherings with Friends and Family: Once a week    Attends Religious Services: More than 4 times per year    Active Member of [employer] or Organizations: Yes    Attends Engineer, structural: More than 4 times per year    Marital Status: Married    Family History  Problem Relation Age of Onset   Asthma Mother    COPD Mother    Hypertension Father    Hyperlipidemia Father    Breast cancer Other        aunts   Diabetes Other        grandmother    Stroke Other        grandmother     Health Maintenance  Topic Date Due   Hepatitis C Screening  Never done   Hepatitis B Vaccines 19-59 Average Risk (1 of 3 - 19+ 3-dose series) Never done   Pneumococcal Vaccine: 50+ Years (1 of 1 - PCV) Never done   COVID-19 Vaccine (2 - Janssen risk series) 11/03/2024 (Originally 03/16/2020)   Mammogram  07/15/2025   DTaP/Tdap/Td (2 - Td or Tdap) 04/23/2027   Colonoscopy  05/04/2028   Cervical Cancer Screening (HPV/Pap Cotest)  07/21/2029   Influenza  Vaccine  Completed   HPV VACCINES  Aged Out   Meningococcal B Vaccine  Aged Out   HIV Screening  Discontinued   Zoster Vaccines- Shingrix   Discontinued     ----------------------------------------------------------------------------------------------------------------------------------------------------------------------------------------------------------------- Physical Exam BP 123/71 (BP Location: Left Arm, Patient Position: Sitting, Cuff Size: Normal)   Pulse 67   Ht 5' 3 (1.6 m)   Wt 139 lb (63 kg)   LMP 03/25/2017 (Approximate)   SpO2 99%   BMI 24.62 kg/m   Physical Exam Constitutional:      Appearance: Normal appearance.  Eyes:     General: No scleral icterus. Musculoskeletal:     Cervical back: Neck supple.  Neurological:     General: No focal deficit present.     Mental Status: She is alert.  Psychiatric:        Mood and Affect: Mood normal.        Behavior: Behavior normal.     ------------------------------------------------------------------------------------------------------------------------------------------------------------------------------------------------------------------- Assessment and Plan  Anxiety and depression She has tried multiple medications previously.  GeneSight testing reviewed.  Has not tried fluoxetine , which is in the moderate column on her GeneSight testing.  Will start fluoxetine  and cross-taper with her duloxetine .  Will plan to follow-up in 4 to 6 weeks.   Meds ordered this encounter  Medications   FLUoxetine  (PROZAC ) 10 MG capsule    Sig: May increase to 20mg  after 2 weeks    Dispense:  90 capsule    Refill:  3   DULoxetine  (CYMBALTA ) 20 MG capsule    Sig: Take 1 cap po daily x 7 days then every other day x7 days.    Dispense:  20 capsule    Refill:  0    Return in about 6 weeks (around 09/16/2024) for Mood/BH.

## 2024-08-19 ENCOUNTER — Encounter: Payer: Self-pay | Admitting: Podiatry

## 2024-08-19 ENCOUNTER — Ambulatory Visit: Admitting: Podiatry

## 2024-08-19 DIAGNOSIS — M722 Plantar fascial fibromatosis: Secondary | ICD-10-CM

## 2024-08-19 DIAGNOSIS — B351 Tinea unguium: Secondary | ICD-10-CM | POA: Diagnosis not present

## 2024-08-19 NOTE — Progress Notes (Signed)
  Subjective:  Patient ID: Rachael [name], female    DOB: March 01, 1966,   MRN: [id number]  Chief Complaint  Patient presents with   Nail Problem    I think it's getting there.    58 y.o. female presents for follow-up of fungal nails relates she is maybe having some improvement.  She does relate still getting pain in her great toe and also now getting pain in her right heel especially first steps in the morning or after going barefoot. She has tried wearing shoes more often but denies any other treatments.  Denies any other pedal complaints. Denies n/v/f/c.   Past Medical History:  Diagnosis Date   Anxiety    Depression    Hypertension     Objective:  Physical Exam: Vascular: DP/PT pulses 2/4 bilateral. CFT <3 seconds. Normal hair growth on digits. No edema.  Skin. No lacerations or abrasions bilateral feet. Right hallux nail thickened and discolored medially with subungual debris tracking to just 1/2 proximal nail fold.  Musculoskeletal: MMT 5/5 bilateral lower extremities in DF, PF, Inversion and Eversion. Deceased ROM in DF of ankle joint. Right first MPJ tender to palpation dorsally. Diminished ROM and pain on end ROM. No pain proximally or around heel.  Neurological: Sensation intact to light touch.   Assessment:   1. Onychomycosis   2. Plantar fasciitis, right           Plan:  Patient was evaluated and treated and all questions answered. -Doing well from arthritis standpoint for now.  -Discussed treatment options for painful dystrophic nails  -Culture results positive for fungus.  -Discussed fungal nail treatment options including oral, topical, and laser treatments.  -Will continue with penlac  refill sent.  Discussed plantar fasciitis with patient.  Discussed treatment options including, ice, NSAIDS, supportive shoes, bracing, and stretching. Stretching exercises provided to be done on a daily basis.   -Patient to return as needed    Asberry Failing, DPM

## 2024-08-19 NOTE — Patient Instructions (Signed)

## 2024-08-31 ENCOUNTER — Ambulatory Visit (HOSPITAL_BASED_OUTPATIENT_CLINIC_OR_DEPARTMENT_OTHER)
Admission: RE | Admit: 2024-08-31 | Discharge: 2024-08-31 | Disposition: A | Discharge status: Home / Self Care | Source: Ambulatory Visit | Attending: Obstetrics and Gynecology | Admitting: Obstetrics and Gynecology

## 2024-08-31 ENCOUNTER — Encounter (HOSPITAL_BASED_OUTPATIENT_CLINIC_OR_DEPARTMENT_OTHER): Payer: Self-pay

## 2024-08-31 DIAGNOSIS — Z01419 Encounter for gynecological examination (general) (routine) without abnormal findings: Secondary | ICD-10-CM | POA: Diagnosis present

## 2024-08-31 DIAGNOSIS — Z1231 Encounter for screening mammogram for malignant neoplasm of breast: Secondary | ICD-10-CM | POA: Insufficient documentation

## 2024-09-16 ENCOUNTER — Ambulatory Visit: Admitting: Family Medicine

## 2024-09-16 ENCOUNTER — Encounter: Payer: Self-pay | Admitting: Family Medicine

## 2024-09-16 VITALS — BP 116/71 | HR 60 | Ht 63.0 in | Wt 141.0 lb

## 2024-09-16 DIAGNOSIS — F32A Depression, unspecified: Secondary | ICD-10-CM

## 2024-09-16 DIAGNOSIS — F419 Anxiety disorder, unspecified: Secondary | ICD-10-CM | POA: Diagnosis not present

## 2024-09-16 NOTE — Progress Notes (Signed)
 Rachael [name] - 58 y.o. female MRN [id number]  Date of birth: [birth year]  Subjective Chief Complaint  Patient presents with   Depression   Anxiety    HPI Rachael [name] is a 58 y.o. female here today for follow up visit.   We started fluoxetine  at previous visit.  She reports that this does seem be helpful.  Side effect profile is much better compared to duloxetine .  Some nausea initially but this has resolved at this point.  She would like to continue at current strength.   A little more indigestion recently.  She is taking pantoprazole  daily.   ROS:  A comprehensive ROS was completed and negative except as noted per HPI  Allergies  Allergen Reactions   Abilify [Aripiprazole]     Panic attacks when coupled with cymbalta    Shingrix  [Zoster Vac Recomb Adjuvanted] Hives    Past Medical History:  Diagnosis Date   Anxiety    Depression    Hypertension     Past Surgical History:  Procedure Laterality Date   CESAREAN SECTION  01/04/1999    Social History   Socioeconomic History   Marital status: Married    Spouse name: Not on file   Number of children: Not on file   Years of education: Not on file   Highest education level: Associate degree: academic program  Occupational History   Not on file  Tobacco Use   Smoking status: Never   Smokeless tobacco: Never  Vaping Use   Vaping status: Never Used  Substance and Sexual Activity   Alcohol use: No   Drug use: No   Sexual activity: Yes    Partners: Male    Birth control/protection: None  Other Topics Concern   Not on file  Social History Narrative   Not on file   Social Drivers of Health   Financial Resource Strain: Patient Declined (09/14/2024)   Overall Financial Resource Strain (CARDIA)    Difficulty of Paying Living Expenses: Patient declined  Food Insecurity: Patient Declined (09/14/2024)   Hunger Vital Sign    Worried About Running Out of Food in the Last Year: Patient declined    Ran Out of Food in  the Last Year: Patient declined  Transportation Needs: Patient Declined (09/14/2024)   PRAPARE - Administrator, Civil Service (Medical): Patient declined    Lack of Transportation (Non-Medical): Patient declined  Physical Activity: Sufficiently Active (09/14/2024)   Exercise Vital Sign    Days of Exercise per Week: 5 days    Minutes of Exercise per Session: 50 min  Stress: Stress Concern Present (09/14/2024)   [employer] of Occupational Health - Occupational Stress Questionnaire    Feeling of Stress: To some extent  Social Connections: Socially Integrated (09/14/2024)   Social Connection and Isolation Panel    Frequency of Communication with Friends and Family: More than three times a week    Frequency of Social Gatherings with Friends and Family: Twice a week    Attends Religious Services: More than 4 times per year    Active Member of [employer] or Organizations: Yes    Attends Engineer, Structural: More than 4 times per year    Marital Status: Married    Family History  Problem Relation Age of Onset   Asthma Mother    COPD Mother    Hypertension Father    Hyperlipidemia Father    Breast cancer Other        aunts   Diabetes Other  grandmother    Stroke Other        grandmother     Health Maintenance  Topic Date Due   Hepatitis C Screening  Never done   COVID-19 Vaccine (2 - Janssen risk series) 11/03/2024 (Originally 03/16/2020)   Pneumococcal Vaccine: 50+ Years (1 of 1 - PCV) 09/16/2025 (Originally 10/15/2018)   Hepatitis B Vaccines 19-59 Average Risk (1 of 3 - 19+ 3-dose series) 09/16/2025 (Originally 10/16/1987)   Mammogram  08/31/2026   DTaP/Tdap/Td (2 - Td or Tdap) 04/23/2027   Colonoscopy  05/04/2028   Cervical Cancer Screening (HPV/Pap Cotest)  07/21/2029   Influenza Vaccine  Completed   HPV VACCINES  Aged Out   Meningococcal B Vaccine  Aged Out   HIV Screening  Discontinued   Zoster Vaccines- Shingrix   Discontinued      ----------------------------------------------------------------------------------------------------------------------------------------------------------------------------------------------------------------- Physical Exam BP 116/71 (BP Location: Left Arm, Patient Position: Sitting, Cuff Size: Normal)   Pulse 60   Ht 5' 3 (1.6 m)   Wt 141 lb (64 kg)   LMP 03/25/2017 (Approximate)   SpO2 100%   BMI 24.98 kg/m   Physical Exam Constitutional:      Appearance: Normal appearance.  Eyes:     General: No scleral icterus. Musculoskeletal:     Cervical back: Neck supple.  Neurological:     General: No focal deficit present.     Mental Status: She is alert.  Psychiatric:        Mood and Affect: Mood normal.        Behavior: Behavior normal.     ------------------------------------------------------------------------------------------------------------------------------------------------------------------------------------------------------------------- Assessment and Plan  Anxiety and depression Doing ok with fluoxetine  at this time.  Will plan to continue.  Discussed trial of magnesium glycinate at night to help with sleep. F/u in 3 months.    No orders of the defined types were placed in this encounter.   Return in about 3 months (around 12/17/2024) for Mood/BH.

## 2024-09-16 NOTE — Patient Instructions (Signed)
 Try magnesium glycinate 250-500mg  at bedtime.

## 2024-09-16 NOTE — Assessment & Plan Note (Signed)
 Doing ok with fluoxetine  at this time.  Will plan to continue.  Discussed trial of magnesium glycinate at night to help with sleep. F/u in 3 months.

## 2024-09-30 ENCOUNTER — Encounter: Payer: Self-pay | Admitting: Family Medicine

## 2024-09-30 MED ORDER — FLUOXETINE HCL 20 MG PO CAPS: ORAL_CAPSULE | ORAL | 1 refills | Status: DC

## 2024-09-30 NOTE — Telephone Encounter (Signed)
 Requesting rx rf of fluoxetine  10mg   as 2 per day  Last written 08/05/2024 as 90 day supply with 3 refills  With directions to increase to 20mg  in 2 weeks   Is it o.k. to resend as 20mg   to see if patient would be able to fill the prescription now since she is almost out of medication ?

## 2024-11-16 ENCOUNTER — Ambulatory Visit: Admitting: Family Medicine

## 2024-11-16 ENCOUNTER — Encounter: Payer: Self-pay | Admitting: Family Medicine

## 2024-11-16 VITALS — BP 128/72 | HR 59 | Ht 63.0 in | Wt 141.0 lb

## 2024-11-16 DIAGNOSIS — F419 Anxiety disorder, unspecified: Secondary | ICD-10-CM | POA: Diagnosis not present

## 2024-11-16 DIAGNOSIS — F32A Depression, unspecified: Secondary | ICD-10-CM

## 2024-11-16 MED ORDER — FLUOXETINE HCL 20 MG PO CAPS: 20.0000 mg | ORAL_CAPSULE | Freq: Two times a day (BID) | ORAL | 1 refills | Status: AC

## 2024-11-16 NOTE — Progress Notes (Signed)
 " Rachael [name] - 59 y.o. female MRN [id number]  Date of birth: 1966/10/05  Subjective Chief Complaint  Patient presents with   Mood    HPI Rachael [name] is a 59 y.o. female here today for follow up visit.   We was on duloxetine  for anxiety and depression.  This did work well for her, however she was having significant sexual side effects from this.  Changed to bupropion  but had dark thoughts with this.  Changed to fluoxetine  at 20mg  daily.  She feels that this has helped some but she still has quite a bit of anxiety and feels more depressed at times.  Denies SI/HI.  Continues buspar  qpm along with magnesium which helps.    ROS:  A comprehensive ROS was completed and negative except as noted per HPI  Allergies[1]  Past Medical History:  Diagnosis Date   Anxiety    Depression    Hypertension     Past Surgical History:  Procedure Laterality Date   CESAREAN SECTION  01/04/1999    Social History   Socioeconomic History   Marital status: Married    Spouse name: Not on file   Number of children: Not on file   Years of education: Not on file   Highest education level: Associate degree: academic program  Occupational History   Not on file  Tobacco Use   Smoking status: Never   Smokeless tobacco: Never  Vaping Use   Vaping status: Never Used  Substance and Sexual Activity   Alcohol use: No   Drug use: No   Sexual activity: Yes    Partners: Male    Birth control/protection: None  Other Topics Concern   Not on file  Social History Narrative   Not on file   Social Drivers of Health   Tobacco Use: Low Risk (09/16/2024)   Patient History    Smoking Tobacco Use: Never    Smokeless Tobacco Use: Never    Passive Exposure: Not on file  Financial Resource Strain: Patient Declined (09/14/2024)   Overall Financial Resource Strain (CARDIA)    Difficulty of Paying Living Expenses: Patient declined  Food Insecurity: Patient Declined (09/14/2024)   Epic    Worried About  Programme Researcher, Broadcasting/film/video in the Last Year: Patient declined    Barista in the Last Year: Patient declined  Transportation Needs: Patient Declined (09/14/2024)   Epic    Lack of Transportation (Medical): Patient declined    Lack of Transportation (Non-Medical): Patient declined  Physical Activity: Sufficiently Active (09/14/2024)   Exercise Vital Sign    Days of Exercise per Week: 5 days    Minutes of Exercise per Session: 50 min  Stress: Stress Concern Present (09/14/2024)   [employer] of Occupational Health - Occupational Stress Questionnaire    Feeling of Stress: To some extent  Social Connections: Socially Integrated (09/14/2024)   Social Connection and Isolation Panel    Frequency of Communication with Friends and Family: More than three times a week    Frequency of Social Gatherings with Friends and Family: Twice a week    Attends Religious Services: More than 4 times per year    Active Member of Clubs or Organizations: Yes    Attends Banker Meetings: More than 4 times per year    Marital Status: Married  Depression (PHQ2-9): Medium Risk (11/16/2024)   Depression (PHQ2-9)    PHQ-2 Score: 7  Alcohol Screen: Low Risk (05/04/2024)   Alcohol Screen  Last Alcohol Screening Score (AUDIT): 0  Housing: Patient Declined (09/14/2024)   Epic    Unable to Pay for Housing in the Last Year: Patient declined    Number of Times Moved in the Last Year: Not on file    Homeless in the Last Year: Patient declined  Utilities: Not At Risk (05/04/2024)   Epic    Threatened with loss of utilities: No  Health Literacy: Adequate Health Literacy (05/04/2024)   B1300 Health Literacy    Frequency of need for help with medical instructions: Never    Family History  Problem Relation Age of Onset   Asthma Mother    COPD Mother    Hypertension Father    Hyperlipidemia Father    Breast cancer Other        aunts   Diabetes Other        grandmother    Stroke Other         grandmother     Health Maintenance  Topic Date Due   Hepatitis C Screening  Never done   COVID-19 Vaccine (2 - Janssen risk series) 12/02/2024 (Originally 03/16/2020)   Pneumococcal Vaccine: 50+ Years (1 of 1 - PCV) 09/16/2025 (Originally 10/15/2018)   Hepatitis B Vaccines 19-59 Average Risk (1 of 3 - 19+ 3-dose series) 09/16/2025 (Originally 10/16/1987)   Mammogram  08/31/2026   DTaP/Tdap/Td (2 - Td or Tdap) 04/23/2027   Colonoscopy  05/04/2028   Cervical Cancer Screening (HPV/Pap Cotest)  07/21/2029   Influenza Vaccine  Completed   HPV VACCINES  Aged Out   Meningococcal B Vaccine  Aged Out   HIV Screening  Discontinued   Zoster Vaccines- Shingrix   Discontinued     ----------------------------------------------------------------------------------------------------------------------------------------------------------------------------------------------------------------- Physical Exam BP 128/72 (BP Location: Left Arm, Patient Position: Sitting, Cuff Size: Normal)   Pulse (!) 59   Ht 5' 3 (1.6 m)   Wt 141 lb (64 kg)   LMP 03/25/2017   SpO2 100%   BMI 24.98 kg/m   Physical Exam Constitutional:      Appearance: Normal appearance.  Eyes:     General: No scleral icterus. Cardiovascular:     Rate and Rhythm: Normal rate and regular rhythm.  Pulmonary:     Effort: Pulmonary effort is normal.     Breath sounds: Normal breath sounds.  Neurological:     Mental Status: She is alert.  Psychiatric:        Mood and Affect: Mood normal.        Behavior: Behavior normal.     ------------------------------------------------------------------------------------------------------------------------------------------------------------------------------------------------------------------- Assessment and Plan  Anxiety and depression Increasing fluoxetine  to 20mg  BID.  Discussed alternatives including vraylar or rexulti. Return in 6 weeks for follow up .     Meds ordered this encounter   Medications   FLUoxetine  (PROZAC ) 20 MG capsule    Sig: Take 1 capsule (20 mg total) by mouth 2 (two) times daily.    Dispense:  60 capsule    Refill:  1    Return in about 6 weeks (around 12/28/2024).        [1]  Allergies Allergen Reactions   Abilify [Aripiprazole]     Panic attacks when coupled with cymbalta    Shingrix  [Zoster Vac Recomb Adjuvanted] Hives   "

## 2024-11-16 NOTE — Assessment & Plan Note (Signed)
 Increasing fluoxetine  to 20mg  BID.  Discussed alternatives including vraylar or rexulti. Return in 6 weeks for follow up .

## 2024-11-24 ENCOUNTER — Other Ambulatory Visit: Payer: Self-pay | Admitting: Family Medicine

## 2024-11-24 DIAGNOSIS — F410 Panic disorder [episodic paroxysmal anxiety] without agoraphobia: Secondary | ICD-10-CM

## 2024-11-27 ENCOUNTER — Other Ambulatory Visit: Payer: Self-pay | Admitting: Family Medicine

## 2024-11-27 DIAGNOSIS — M7551 Bursitis of right shoulder: Secondary | ICD-10-CM

## 2024-12-03 ENCOUNTER — Other Ambulatory Visit: Payer: Self-pay | Admitting: Family Medicine

## 2024-12-03 DIAGNOSIS — F411 Generalized anxiety disorder: Secondary | ICD-10-CM

## 2024-12-06 NOTE — Telephone Encounter (Signed)
 ALPRAZOLAM  Last OV: 11/16/24 Next OV: 2/5 & 12/28/24 Last RF: 05/04/24

## 2024-12-16 ENCOUNTER — Encounter: Payer: Self-pay | Admitting: Family Medicine

## 2024-12-16 ENCOUNTER — Ambulatory Visit: Admitting: Family Medicine

## 2024-12-16 VITALS — BP 123/78 | HR 69 | Ht 63.0 in | Wt 140.0 lb

## 2024-12-16 DIAGNOSIS — R1319 Other dysphagia: Secondary | ICD-10-CM | POA: Insufficient documentation

## 2024-12-16 DIAGNOSIS — F32A Depression, unspecified: Secondary | ICD-10-CM | POA: Diagnosis not present

## 2024-12-16 DIAGNOSIS — F419 Anxiety disorder, unspecified: Secondary | ICD-10-CM | POA: Diagnosis not present

## 2024-12-16 MED ORDER — SUCRALFATE 1 G PO TABS: 1.0000 g | ORAL_TABLET | Freq: Three times a day (TID) | ORAL | 1 refills | Status: AC

## 2024-12-16 NOTE — Progress Notes (Signed)
 " Rachael [name] - 59 y.o. female MRN [id number]  Date of birth: 1966-06-24  Subjective Chief Complaint  Patient presents with   Mood   esophageal dysphasia    HPI Rachael [name] is a 59 y.o. female here today for follow up visit.   Fluoxetine  was increased at last visit.  Doesn't feel that she can tell a big difference with change in this.  She is tolerating well without significant side effects.  Continues with buspar  with alprazolam  as needed.    She is having more pain after eating with feeling of food getting stuck in her chest.  She has had to have esophageal dilation in the past.  She is taking pantoprazole  with carafate  as needed.   ROS:  A comprehensive ROS was completed and negative except as noted per HPI  Allergies[1]  Past Medical History:  Diagnosis Date   Anxiety    Depression    Hypertension     Past Surgical History:  Procedure Laterality Date   CESAREAN SECTION  01/04/1999    Social History   Socioeconomic History   Marital status: Married    Spouse name: Not on file   Number of children: Not on file   Years of education: Not on file   Highest education level: Associate degree: academic program  Occupational History   Not on file  Tobacco Use   Smoking status: Never   Smokeless tobacco: Never  Vaping Use   Vaping status: Never Used  Substance and Sexual Activity   Alcohol use: No   Drug use: No   Sexual activity: Yes    Partners: Male    Birth control/protection: None  Other Topics Concern   Not on file  Social History Narrative   Not on file   Social Drivers of Health   Tobacco Use: Low Risk (12/16/2024)   Patient History    Smoking Tobacco Use: Never    Smokeless Tobacco Use: Never    Passive Exposure: Not on file  Financial Resource Strain: Patient Declined (09/14/2024)   Overall Financial Resource Strain (CARDIA)    Difficulty of Paying Living Expenses: Patient declined  Food Insecurity: Patient Declined (09/14/2024)   Epic     Worried About Programme Researcher, Broadcasting/film/video in the Last Year: Patient declined    Barista in the Last Year: Patient declined  Transportation Needs: Patient Declined (09/14/2024)   Epic    Lack of Transportation (Medical): Patient declined    Lack of Transportation (Non-Medical): Patient declined  Physical Activity: Sufficiently Active (09/14/2024)   Exercise Vital Sign    Days of Exercise per Week: 5 days    Minutes of Exercise per Session: 50 min  Stress: Stress Concern Present (09/14/2024)   [employer] of Occupational Health - Occupational Stress Questionnaire    Feeling of Stress: To some extent  Social Connections: Socially Integrated (09/14/2024)   Social Connection and Isolation Panel    Frequency of Communication with Friends and Family: More than three times a week    Frequency of Social Gatherings with Friends and Family: Twice a week    Attends Religious Services: More than 4 times per year    Active Member of Clubs or Organizations: Yes    Attends Banker Meetings: More than 4 times per year    Marital Status: Married  Depression (PHQ2-9): Medium Risk (11/16/2024)   Depression (PHQ2-9)    PHQ-2 Score: 7  Alcohol Screen: Low Risk (05/04/2024)   Alcohol Screen  Last Alcohol Screening Score (AUDIT): 0  Housing: Patient Declined (09/14/2024)   Epic    Unable to Pay for Housing in the Last Year: Patient declined    Number of Times Moved in the Last Year: Not on file    Homeless in the Last Year: Patient declined  Utilities: Not At Risk (05/04/2024)   Epic    Threatened with loss of utilities: No  Health Literacy: Adequate Health Literacy (05/04/2024)   B1300 Health Literacy    Frequency of need for help with medical instructions: Never    Family History  Problem Relation Age of Onset   Asthma Mother    COPD Mother    Hypertension Father    Hyperlipidemia Father    Breast cancer Other        aunts   Diabetes Other        grandmother    Stroke Other         grandmother     Health Maintenance  Topic Date Due   Hepatitis C Screening  Never done   COVID-19 Vaccine (2 - Janssen risk series) 03/16/2020   Pneumococcal Vaccine: 50+ Years (1 of 1 - PCV) 09/16/2025 (Originally 10/15/2018)   Hepatitis B Vaccines 19-59 Average Risk (1 of 3 - 19+ 3-dose series) 09/16/2025 (Originally 10/16/1987)   Mammogram  08/31/2026   DTaP/Tdap/Td (2 - Td or Tdap) 04/23/2027   Colonoscopy  05/04/2028   Cervical Cancer Screening (HPV/Pap Cotest)  07/21/2029   Influenza Vaccine  Completed   HPV VACCINES (No Doses Required) Completed   Meningococcal B Vaccine  Aged Out   HIV Screening  Discontinued   Zoster Vaccines- Shingrix   Discontinued     ----------------------------------------------------------------------------------------------------------------------------------------------------------------------------------------------------------------- Physical Exam BP 123/78 (BP Location: Left Arm, Patient Position: Sitting)   Pulse 69   Ht 5' 3 (1.6 m)   Wt 140 lb (63.5 kg)   LMP 03/25/2017   SpO2 100%   BMI 24.80 kg/m   Physical Exam Constitutional:      Appearance: Normal appearance.  Eyes:     General: No scleral icterus. Cardiovascular:     Rate and Rhythm: Normal rate and regular rhythm.  Pulmonary:     Effort: Pulmonary effort is normal.     Breath sounds: Normal breath sounds.  Musculoskeletal:     Cervical back: Neck supple.  Neurological:     Mental Status: She is alert.  Psychiatric:        Mood and Affect: Mood normal.        Behavior: Behavior normal.     ------------------------------------------------------------------------------------------------------------------------------------------------------------------------------------------------------------------- Assessment and Plan  Anxiety and depression Fluoxetine  is somewhat helpful but increase in strength hasn't seemed to really be more effective than previous strength.   GI symptoms make panic worse at times.  She may continue alprazolam  as needed.   Esophageal dysphagia Referral placed back to GI.  Increase pantoprazole  to BID x2 weeks.  Continue carafate  as needed.    Meds ordered this encounter  Medications   sucralfate  (CARAFATE ) 1 g tablet    Sig: Take 1 tablet (1 g total) by mouth 4 (four) times daily -  with meals and at bedtime.    Dispense:  120 tablet    Refill:  1    Return in about 3 months (around 03/15/2025) for Mood/BH.        [1]  Allergies Allergen Reactions   Abilify [Aripiprazole]     Panic attacks when coupled with cymbalta    Shingrix  [Zoster Vac Recomb Adjuvanted] Hives   "

## 2024-12-16 NOTE — Patient Instructions (Addendum)
 Take pantoprazole  twice daily x 2 weeks then decrease to once daily.  Continue carafate  as needed.  Let me know if worsening.

## 2024-12-16 NOTE — Assessment & Plan Note (Signed)
 Referral placed back to GI.  Increase pantoprazole  to BID x2 weeks.  Continue carafate  as needed.

## 2024-12-16 NOTE — Assessment & Plan Note (Signed)
 Fluoxetine  is somewhat helpful but increase in strength hasn't seemed to really be more effective than previous strength.  GI symptoms make panic worse at times.  She may continue alprazolam  as needed.

## 2024-12-28 ENCOUNTER — Ambulatory Visit: Admitting: Family Medicine

## 2025-03-15 ENCOUNTER — Ambulatory Visit: Admitting: Family Medicine
